# Patient Record
Sex: Female | Born: 1965
Health system: Southern US, Community
[De-identification: ages and names within clinical notes are randomized; demographics above are authoritative.]

## PROBLEM LIST (undated history)

## (undated) DIAGNOSIS — N951 Menopausal and female climacteric states: Secondary | ICD-10-CM

## (undated) DIAGNOSIS — N736 Female pelvic peritoneal adhesions (postinfective): Secondary | ICD-10-CM

## (undated) DIAGNOSIS — C439 Malignant melanoma of skin, unspecified: Secondary | ICD-10-CM

## (undated) DIAGNOSIS — N3090 Cystitis, unspecified without hematuria: Secondary | ICD-10-CM

## (undated) DIAGNOSIS — T753XXA Motion sickness, initial encounter: Secondary | ICD-10-CM

## (undated) DIAGNOSIS — I10 Essential (primary) hypertension: Secondary | ICD-10-CM

## (undated) DIAGNOSIS — R1319 Other dysphagia: Secondary | ICD-10-CM

## (undated) DIAGNOSIS — K219 Gastro-esophageal reflux disease without esophagitis: Secondary | ICD-10-CM

## (undated) HISTORY — PX: LIPOSUCTION: SHX10

## (undated) HISTORY — PX: EXPLORATORY LAPAROTOMY: SUR591

## (undated) HISTORY — DX: Essential (primary) hypertension: I10

## (undated) HISTORY — DX: Menopausal and female climacteric states: N95.1

## (undated) HISTORY — DX: Malignant melanoma of skin, unspecified: C43.9

## (undated) HISTORY — DX: Female pelvic peritoneal adhesions (postinfective): N73.6

## (undated) HISTORY — DX: Other dysphagia: R13.19

## (undated) HISTORY — PX: ROTATOR CUFF REPAIR: SHX139

## (undated) HISTORY — DX: Cystitis, unspecified without hematuria: N30.90

---

## 1995-10-04 HISTORY — PX: OVARY SURGERY: SHX727

## 1998-10-03 HISTORY — PX: ABDOMINAL HYSTERECTOMY: SHX81

## 2004-12-17 ENCOUNTER — Ambulatory Visit: Payer: Self-pay | Admitting: Internal Medicine

## 2005-05-11 ENCOUNTER — Ambulatory Visit: Payer: Self-pay | Admitting: Podiatry

## 2008-02-08 ENCOUNTER — Ambulatory Visit: Payer: Self-pay | Admitting: Internal Medicine

## 2008-02-11 ENCOUNTER — Ambulatory Visit: Payer: Self-pay | Admitting: Internal Medicine

## 2009-01-15 ENCOUNTER — Ambulatory Visit: Payer: Self-pay | Admitting: Unknown Physician Specialty

## 2009-01-22 ENCOUNTER — Ambulatory Visit: Payer: Self-pay | Admitting: Unknown Physician Specialty

## 2009-03-25 ENCOUNTER — Ambulatory Visit: Payer: Self-pay | Admitting: Unknown Physician Specialty

## 2009-03-26 ENCOUNTER — Ambulatory Visit: Payer: Self-pay | Admitting: Unknown Physician Specialty

## 2010-01-31 HISTORY — PX: BREAST CYST ASPIRATION: SHX578

## 2010-06-10 ENCOUNTER — Encounter: Payer: Self-pay | Admitting: Obstetrics and Gynecology

## 2010-07-03 ENCOUNTER — Encounter: Payer: Self-pay | Admitting: Obstetrics and Gynecology

## 2010-08-03 ENCOUNTER — Encounter: Payer: Self-pay | Admitting: Obstetrics and Gynecology

## 2011-10-04 HISTORY — PX: BASAL CELL CARCINOMA EXCISION: SHX1214

## 2012-05-30 ENCOUNTER — Ambulatory Visit: Payer: Self-pay | Admitting: Family Medicine

## 2012-06-03 HISTORY — PX: MELANOMA EXCISION: SHX5266

## 2012-06-13 ENCOUNTER — Ambulatory Visit: Payer: Self-pay | Admitting: Anesthesiology

## 2012-06-13 LAB — HEMOGLOBIN: HGB: 13.2 g/dL (ref 12.0–16.0)

## 2012-06-13 LAB — POTASSIUM: Potassium: 3.2 mmol/L — ABNORMAL LOW (ref 3.5–5.1)

## 2012-06-19 ENCOUNTER — Ambulatory Visit: Payer: Self-pay | Admitting: Surgery

## 2012-06-19 HISTORY — PX: CHOLECYSTECTOMY: SHX55

## 2012-06-19 LAB — HEPATIC FUNCTION PANEL A (ARMC)
Albumin: 3.9 g/dL (ref 3.4–5.0)
Alkaline Phosphatase: 66 U/L (ref 50–136)
Bilirubin, Direct: <0.1 mg/dL (ref 0.00–0.20)
Bilirubin,Total: 0.4 mg/dL (ref 0.2–1.0)
SGOT(AST): 10 U/L — ABNORMAL LOW (ref 15–37)
SGPT (ALT): 16 U/L (ref 12–78)
Total Protein: 7.7 g/dL (ref 6.4–8.2)

## 2012-06-19 LAB — BASIC METABOLIC PANEL
Anion Gap: 5 — ABNORMAL LOW (ref 7–16)
BUN: 7 mg/dL (ref 7–18)
Calcium, Total: 9.5 mg/dL (ref 8.5–10.1)
Chloride: 103 mmol/L (ref 98–107)
Co2: 30 mmol/L (ref 21–32)
Creatinine: 0.9 mg/dL (ref 0.60–1.30)
EGFR (African American): >60
EGFR (Non-African Amer.): >60
Glucose: 88 mg/dL (ref 65–99)
Osmolality: 273 (ref 275–301)
Potassium: 3.9 mmol/L (ref 3.5–5.1)
Sodium: 138 mmol/L (ref 136–145)

## 2012-06-19 LAB — CBC WITH DIFFERENTIAL/PLATELET
Basophil #: 0.1 10*3/uL (ref 0.0–0.1)
Basophil %: 1 %
Eosinophil #: 0.1 10*3/uL (ref 0.0–0.7)
Eosinophil %: 1.9 %
HCT: 37.6 % (ref 35.0–47.0)
HGB: 13.1 g/dL (ref 12.0–16.0)
Lymphocyte #: 2.2 10*3/uL (ref 1.0–3.6)
Lymphocyte %: 43.2 %
MCH: 29.5 pg (ref 26.0–34.0)
MCHC: 34.7 g/dL (ref 32.0–36.0)
MCV: 85 fL (ref 80–100)
Monocyte #: 0.5 x10 3/mm (ref 0.2–0.9)
Monocyte %: 9.4 %
Neutrophil #: 2.3 10*3/uL (ref 1.4–6.5)
Neutrophil %: 44.5 %
Platelet: 241 10*3/uL (ref 150–440)
RBC: 4.43 10*6/uL (ref 3.80–5.20)
RDW: 13.4 % (ref 11.5–14.5)
WBC: 5.2 10*3/uL (ref 3.6–11.0)

## 2012-06-21 LAB — PATHOLOGY REPORT

## 2013-05-14 LAB — BASIC METABOLIC PANEL
BUN: 12 mg/dL (ref 4–21)
Creatinine: 0.8 mg/dL (ref 0.5–1.1)
Glucose: 91 mg/dL
Potassium: 4.6 mmol/L (ref 3.4–5.3)
Sodium: 142 mmol/L (ref 137–147)

## 2013-05-14 LAB — LIPID PANEL
Cholesterol: 224 mg/dL — AB (ref 0–200)
HDL: 61 mg/dL (ref 35–70)
LDL Cholesterol: 147 mg/dL
Triglycerides: 78 mg/dL (ref 40–160)

## 2013-05-14 LAB — CBC AND DIFFERENTIAL
HCT: 41 % (ref 36–46)
Hemoglobin: 13.8 g/dL (ref 12.0–16.0)
Neutrophils Absolute: 4 /uL
Platelets: 288 10*3/uL (ref 150–399)
WBC: 6.2 10^3/mL

## 2013-05-14 LAB — HEPATIC FUNCTION PANEL
ALT: 10 U/L (ref 7–35)
AST: 15 U/L (ref 13–35)
Alkaline Phosphatase: 55 U/L (ref 25–125)
Bilirubin, Total: 0.4 mg/dL

## 2013-05-14 LAB — TSH: TSH: 0.67 u[IU]/mL (ref 0.41–5.90)

## 2013-06-12 ENCOUNTER — Ambulatory Visit: Payer: Self-pay | Admitting: Family Medicine

## 2013-06-26 ENCOUNTER — Ambulatory Visit: Payer: Self-pay | Admitting: Family Medicine

## 2013-09-23 ENCOUNTER — Ambulatory Visit: Payer: Self-pay | Admitting: Family Medicine

## 2014-07-25 ENCOUNTER — Ambulatory Visit: Payer: Self-pay | Admitting: Orthopedic Surgery

## 2014-09-30 ENCOUNTER — Ambulatory Visit: Payer: Self-pay | Admitting: Orthopedic Surgery

## 2015-01-20 NOTE — Op Note (Signed)
PATIENT NAME:  Jill Porter, Jill Porter MR#:  [id number] DATE OF BIRTH:  1966/04/13  DATE OF PROCEDURE:  06/19/2012  PREOPERATIVE DIAGNOSIS: Symptomatic cholelithiasis.  POSTOPERATIVE DIAGNOSIS: Symptomatic cholelithiasis.   PROCEDURE PERFORMED: Laparoscopic cholecystectomy.   ATTENDING SURGEON: San Lohmeyer A. Marina Gravel, MD FACS  TYPE OF ANESTHESIA: General endotracheal.   INDICATIONS: The patient is a 49 year old white female with a several week history of persistent right upper quadrant postprandial abdominal pain associated with nausea and indigestion. Work-up is consistent with cholelithiasis. I discussed with her and her family laparoscopic cholecystectomy, the risks of surgery including that of bleeding, infection, bile duct injury, need for conversion to open operation, future need for ERCP, and all of her questions were answered.   FINDINGS: Stones.   SPECIMEN: Gallbladder with contents.   ESTIMATED BLOOD LOSS: 25 mL.   DRAINS: None.  LAP AND NEEDLE COUNT: Correct x2.   DESCRIPTION OF PROCEDURE: With the patient in the supine position, general oral endotracheal anesthesia was induced. Her abdomen was widely prepped and draped with ChloraPrep solution. Left arm was padded and tucked at her side. Time-out was observed.   A 12 mm blunt Hassan trocar was placed through an open technique with stay sutures being passed through the fascia and infraumbilical transverse incision. Pneumoperitoneum was established. A 5 mm Bladeless trocar was placed in the epigastric region. The patient was then positioned in reverse Trendelenburg and airplane right side up and two 5-mm lateral ports were placed in the right subcostal margin. The gallbladder was grasped along the fundus and elevated towards the right shoulder. Filmy adhesions of the stomach and omentum were taken down off the anterior surface of the gallbladder with sharp technique. The hepatoduodenal ligament was then incised with blunt technique and hook  electrocautery along the medial and lateral borders of the gallbladder along the peritoneal reflection liberating a cystic duct and cystic artery. Critical view of safety cholecystectomy was performed with three clips being placed on the cystic duct, one on the gallbladder side, two on the cystic artery portal side, and one on the gallbladder side and both structures were then divided sharply. Small several lymphatics were then cauterized. The gallbladder was then retrieved off the gallbladder fossa utilizing hook cautery apparatus. A small rent was made in the gallbladder during this and small spillage of bile was encountered which was easily aspirated and washed out with a total of 2 liters of normal saline at the completion of the operation. There was one small bleeding point on the gallbladder fossa near its edge requiring application of two 10 mm hemoclips, cautery, Surgiflo with thrombin. Hemostasis was ensured on the operative field. The gallbladder was placed into an EndoCatch device and retrieved. With hemostasis being ensured on the operative field, no evidence of further bleeding or bowel injury, the ports were then removed under direct visualization and the infraumbilical fascial defect was closed with an additional figure-of-eight #0 Vicryl suture in vertical orientation. The existing stay sutures were tied to each other. A total of 30 mL of 0.25% plain Marcaine was infiltrated along all skin and fascial incisions prior to closure.   Skin edges were approximated with subcuticular 4-0 Vicryl suture. Benzoin and half-inch Steri-Strips were then applied followed by Tegaderm and Telfa. The patient was then subsequently extubated and taken to the recovery room in stable and satisfactory condition by anesthesia services.   ____________________________ [name] Marina Gravel, MD FACS mab:drc D: 06/19/2012 10:55:24 ET T: 06/19/2012 11:39:03 ET JOB#: [id number]  cc: Elta Guadeloupe A. Marina Gravel, MD, <Dictator>  [name]  MD ELECTRONICALLY SIGNED 06/21/2012 18:17

## 2015-01-28 NOTE — Op Note (Signed)
PATIENT NAME:  Jill Porter, HASSING MR#:  [id number] DATE OF BIRTH:  1966/01/11  DATE OF PROCEDURE:  09/30/2014  PREOPERATIVE DIAGNOSIS: Left shoulder high-grade partial-thickness tear of the supraspinatus with subacromial impingement.   POSTOPERATIVE DIAGNOSIS: Left shoulder high-grade partial-thickness tear of the supraspinatus with subacromial impingement with superior labral fraying.   PROCEDURE: Left shoulder arthroscopic labral debridement and subacromial decompression with mini open rotator cuff repair.   ANESTHESIA: General with interscalene block.   SPECIMENS: None.   COMPLICATIONS: None.   SURGEON: Thornton Park, MD.   ESTIMATED BLOOD LOSS: Minimal.   COMPLICATIONS: None.   INDICATION FOR THE PROCEDURE: The patient is a 49 year old female with persistent left shoulder pain which has not responded to nonoperative management including meloxicam, corticosteroid injection, and physical therapy. An MRI has revealed a high-grade partial tear of the supraspinatus. The patient decided to proceed with a mini open rotator cuff repair given the longevity of her symptoms as well as the persistent pain and disability it has caused. I reviewed the risks and benefits of surgery. She understands the risks include but are not limited to infection, bleeding, nerve or blood vessel injury, shoulder stiffness, persistent pain or instability, retear of the rotator cuff, and the need for further surgery. Medical risks include but are not limited to DVT and pulmonary, myocardial infarction, stroke, pneumonia, respiratory failure, and death. The patient understood these risks and wished to proceed.   PROCEDURE NOTE: The patient was met in the preoperative area. Her husband was at the bedside. I marked the left shoulder with the word "yes" according to the hospital's correct site surgery protocol. She had an interscalene block placed by the anesthesia service at the [facility]. The patient was then  brought to the operating room. She underwent general anesthesia and was positioned in the beach chair position. All bony prominences were adequately padded. A spider arm positioner was utilized for this case.   The patient was prepped and draped in a sterile fashion. A timeout was performed to verify the patient's name, date of birth, medical record number, correct site of surgery, and correct procedure to be performed. It was also used to verify the patient had received antibiotics and that all appropriate instruments, implants, and radiographic studies were available in the room. Once all in attendance were in agreement the case began.   Examination under anesthesia revealed full passive shoulder range of motion. There was no instability to load and shift testing in an anterior or posterior direction. She had a negative sulcus sign.   The bony landmarks were drawn out with a surgical marker along with the proposed incisions. These were pre-injected with 1% lidocaine plain. An 11 blade was used to establish a posterior portal through which the arthroscope was placed into the glenohumeral joint. A full diagnostic examination of the shoulder was undertaken.   Findings on arthroscopy included fraying of the superior labrum with a large superior sublabral sulcus. The biceps anchor and the superior labrum appeared to be intact and there did not seem to be any instability of the biceps. The subscapularis was intact as was the middle glenohumeral ligament. There was fraying of the supraspinatus with a high-grade partial thickness tear. The infraspinatus was intact. The anterior and posterior labrum was intact as well. There were no loose bodies or HAGL lesion seen in the inferior recess. There were no chondral lesions to the glenoid or humeral head surfaces. The anterior portal was established using an 18-gauge spinal needle for localization. Through  this incision a 5.75 mm arthroscopic cannula was placed. This  allowed for a hook probe to be placed for palpation of the superior labrum in the rotator cuff. A 4-0 resector shaver blade was used to debride the fraying of the superior labrum and supraspinatus. The biceps tendon had no intrasubstance tear.   Once the superior labrum was debrided it was felt to be stable and did not require any suture anchors. A 0 PDS was placed through the supraspinatus so it could be identified on the bursal side. The arthroscope was then placed into the subacromial space. Abundant bursitis was encountered. A lateral portal was then established using an 18-gauge spinal needle for localization. Through the lateral portal a 90 degree ArthroCare wand and 4-0 resector shaver blade were used to perform a bursectomy. The 0 PDS was then identified. This allowed for localization of the tear from the bursal side. The tear was completed using a 4-0 resector shaver blade after the 0 PDS was removed. Two Energy manager were placed in the lateral edge of the rotator cuff tear. A 5.5 mm resector shaver blade was then placed through the lateral portal and a subacromial decompression was performed. The patient's MRI did not show advanced degenerative changes at the [facility] joint and therefore distal clavicle excision was not performed.   All bony debris and bursitis were removed from the subacromial space and then all arthroscopic instruments were removed.   A 15 blade was used to make a saber-type incision along the lateral acromion. The deltoid fascia was then identified. The deltoid muscle was split in line with its fibers. The Smart Stitches which had been placed arthroscopically were brought out through the deltoid split. A 5.5 mm resector shaver blade was used to gently burr the footprint of the greater tuberosity to remove any remaining torn fibers of the supraspinatus. Punctate bleeding was identified which will assist in healing of the rotator cuff repair. Two ArthroCare Magnum 2 anchors  were then placed for lateral row fixation in the greater tuberosity. A single Smart Stitch suture which had been placed in the lateral edge of the rotator cuff was threaded through each of these 2 anchors respectively. The sutures were then tensioned for anatomic placement of the rotator cuff repair. The Magnum 2 anchor suture tails were then cut. External images of the rotator cuff repair were taken with the arthroscope. The arthroscope was then placed back into the glenohumeral joint and arthroscopic images of the rotator cuff repair were also taken. The wounds were copiously irrigated. The deltoid split was repaired using a 0 Vicryl and the subcutaneous tissue was closed with 2-0 Vicryl. The skin of the saber incision was closed using a running 4-0 undyed Monocryl. The 3 portal incisions were closed with 4-0 nylon with a single 2-0 Vicryl in the subcutaneous tissues of each of the portal incisions. A dry sterile dressing was applied over Steri-Strips and Xeroform. TENS unit pads as well as a Polar Care sleeve were applied to the left shoulder. The patient was placed in an abduction sling. She was then awakened and brought to the PACU in stable condition. I was scrubbed and present for the entire case and all sharp and instrument counts were correct at the conclusion of the case.    ____________________________ Timoteo Gaul, MD klk:bu D: 10/02/2014 18:28:07 ET T: 10/02/2014 18:46:39 ET JOB#: [id number]  cc: Timoteo Gaul, MD, <Dictator> Timoteo Gaul MD ELECTRONICALLY SIGNED 10/05/2014 16:52

## 2015-03-31 DIAGNOSIS — Z8742 Personal history of other diseases of the female genital tract: Secondary | ICD-10-CM

## 2015-03-31 DIAGNOSIS — F419 Anxiety disorder, unspecified: Secondary | ICD-10-CM

## 2015-03-31 DIAGNOSIS — E559 Vitamin D deficiency, unspecified: Secondary | ICD-10-CM

## 2015-03-31 DIAGNOSIS — E785 Hyperlipidemia, unspecified: Secondary | ICD-10-CM

## 2015-03-31 DIAGNOSIS — N390 Urinary tract infection, site not specified: Secondary | ICD-10-CM

## 2015-03-31 DIAGNOSIS — M2550 Pain in unspecified joint: Secondary | ICD-10-CM | POA: Insufficient documentation

## 2015-03-31 DIAGNOSIS — M722 Plantar fascial fibromatosis: Secondary | ICD-10-CM

## 2015-03-31 DIAGNOSIS — F329 Major depressive disorder, single episode, unspecified: Secondary | ICD-10-CM | POA: Insufficient documentation

## 2015-03-31 DIAGNOSIS — G4700 Insomnia, unspecified: Secondary | ICD-10-CM

## 2015-03-31 DIAGNOSIS — K8020 Calculus of gallbladder without cholecystitis without obstruction: Secondary | ICD-10-CM | POA: Insufficient documentation

## 2015-03-31 DIAGNOSIS — L300 Nummular dermatitis: Secondary | ICD-10-CM

## 2015-03-31 DIAGNOSIS — C439 Malignant melanoma of skin, unspecified: Secondary | ICD-10-CM

## 2015-03-31 DIAGNOSIS — F32A Depression, unspecified: Secondary | ICD-10-CM

## 2015-03-31 DIAGNOSIS — B360 Pityriasis versicolor: Secondary | ICD-10-CM

## 2015-03-31 DIAGNOSIS — I10 Essential (primary) hypertension: Secondary | ICD-10-CM | POA: Insufficient documentation

## 2015-05-04 ENCOUNTER — Encounter: Payer: Self-pay | Admitting: Family Medicine

## 2015-05-04 ENCOUNTER — Ambulatory Visit (INDEPENDENT_AMBULATORY_CARE_PROVIDER_SITE_OTHER): Payer: BLUE CROSS/BLUE SHIELD | Admitting: Family Medicine

## 2015-05-04 ENCOUNTER — Ambulatory Visit: Payer: Self-pay | Admitting: Family Medicine

## 2015-05-04 VITALS — BP 142/98 | HR 110 | Temp 97.8°F | Resp 16 | Ht 64.0 in | Wt 126.0 lb

## 2015-05-04 DIAGNOSIS — F411 Generalized anxiety disorder: Secondary | ICD-10-CM | POA: Diagnosis not present

## 2015-05-04 DIAGNOSIS — G4700 Insomnia, unspecified: Secondary | ICD-10-CM

## 2015-05-04 DIAGNOSIS — I10 Essential (primary) hypertension: Secondary | ICD-10-CM | POA: Diagnosis not present

## 2015-05-04 MED ORDER — LOSARTAN POTASSIUM 50 MG PO TABS: 50.0000 mg | ORAL_TABLET | Freq: Every day | ORAL | Status: DC

## 2015-05-04 NOTE — Progress Notes (Signed)
Patient ID: Jill Porter, female   DOB: [birth year], 49 y.o.   MRN: [id number]   Jill Porter  MRN: [id number] DOB: 1965-12-25  Subjective:  HPI   1. Essential hypertension Patient is a 49 year old female who presents today for follow up of her hypertension.  She was last seen on 01/28/15.  Her blood pressure at that visit was 118/80.  At that time we discontinued her Amlodipine and increased her HCTZ.  She reports good compliance and tolerance of the medication changes.   Patient has checked her blood pressure outside of the office and states that it had been averaging about 130/78.  She does state that this was prior to receiving report on her husband having cancer.  She does report having headaches for about 1 week now.  She states they are in the back of her head and have been bad enough to wake her at night.  2. Generalized anxiety disorder Patient is also here for follow up on her anxiety.  Patient is under increased stress.  Her husband has been diagnosed with colon cancer, he has already had surgery, 12 inches of his bowel was removed and he was found to have 2 positive lymph nodes.  He sees oncology for the first visit tomorrow.  She is also worried about her 89 year old son.  He has been incarcerated for the last 14 months for B&E and he is being released next week.  He is supposed to live with the patient when he gets out and she worries about him getting in trouble again.  3. Insomnia Patient is currently using Ambien to sleep.   She reports with the medication she is sleeping fair.  She falls asleep ok but has trouble staying asleep.  Once she wakes up she has a little trouble going back to sleep.  She said that she is waking about 3-4 times per night every night.   Patient Active Problem List   Diagnosis Date Noted  . Cholelithiasis 03/31/2015  . Melanoma 03/31/2015  . Hx of abnormal cervical Pap smear 03/31/2015  . Tinea versicolor 03/31/2015  . Vitamin D deficiency  03/31/2015  . Hyperlipemia 03/31/2015  . Depression 03/31/2015  . Acute anxiety 03/31/2015  . Insomnia 03/31/2015  . Hypertension 03/31/2015  . Plantar fasciitis 03/31/2015  . Frequent UTI 03/31/2015  . Discoid eczema 03/31/2015    No past medical history on file.  History   Social History  . Marital Status: Married    Spouse Name: N/A  . Number of Children: N/A  . Years of Education: N/A   Occupational History  . Not on file.   Social History Main Topics  . Smoking status: Never Smoker   . Smokeless tobacco: Not on file  . Alcohol Use: 0.0 oz/week    0 Standard drinks or equivalent per week     Comment: Occasional alcohol use; twice a year 2-3 drinks  . Drug Use: No  . Sexual Activity: Not on file   Other Topics Concern  . Not on file   Social History Narrative    Outpatient Prescriptions Prior to Visit  Medication Sig Dispense Refill  . hydrochlorothiazide (HYDRODIURIL) 25 MG tablet Take 25 mg by mouth daily.    Marland Kitchen zolpidem (AMBIEN) 10 MG tablet Take 10 mg by mouth at bedtime as needed for sleep.     No facility-administered medications prior to visit.    Allergies  Allergen Reactions  . Sulfa Antibiotics Hives  .  Penicillins Itching and Rash    Review of Systems  Constitutional: Negative for fever, chills, weight loss, malaise/fatigue and diaphoresis.  Respiratory: Negative for cough, hemoptysis, sputum production, shortness of breath and wheezing.   Cardiovascular: Negative for chest pain, palpitations, orthopnea, claudication, leg swelling and PND.  Neurological: Positive for headaches. Negative for dizziness and weakness.  Psychiatric/Behavioral: Negative for depression, suicidal ideas, hallucinations, memory loss and substance abuse. The patient is nervous/anxious and has insomnia.    Objective:  BP 142/98 mmHg  Pulse 110  Temp(Src) 97.8 F (36.6 C) (Oral)  Resp 16  Ht 5\' 4"  (1.626 m)  Wt 126 lb (57.153 kg)  BMI 21.62 kg/m2  Physical Exam   Constitutional: She is well-developed, well-nourished, and in no distress.  HENT:  Head: Normocephalic and atraumatic.  Right Ear: External ear normal.  Left Ear: External ear normal.  Neck: Normal range of motion. Neck supple.  Cardiovascular: Normal rate, regular rhythm and normal heart sounds.   Pulmonary/Chest: Effort normal and breath sounds normal.  Skin: Skin is warm and dry.  Psychiatric: Mood, memory, affect and judgment normal.    Assessment and Plan :   1. Essential hypertension  - losartan (COZAAR) 50 MG tablet; Take 1 tablet (50 mg total) by mouth daily.  Dispense: 30 tablet; Refill: 12  Follow up in 1 month  2. Generalized anxiety disorder Have discussed with the patient the risks and benefits of taking something for her nerves.  She is coping at this time and feels she does not need to take anything presently.  We have instructed her to just call if she feels she is going to need something.  Patient has also been advised that if we start her on anything we will see her a week later. More than 50% of encounter spent in discussion and counseling regarding issues.  3. Insomnia Patient is on Ambien and at this time we will not make any changes.     Miguel Aschoff MD [facility] 05/04/2015 11:41 AM

## 2015-06-09 ENCOUNTER — Other Ambulatory Visit: Payer: Self-pay | Admitting: Otolaryngology

## 2015-06-09 DIAGNOSIS — J351 Hypertrophy of tonsils: Secondary | ICD-10-CM

## 2015-06-09 DIAGNOSIS — R0989 Other specified symptoms and signs involving the circulatory and respiratory systems: Secondary | ICD-10-CM

## 2015-06-09 DIAGNOSIS — R198 Other specified symptoms and signs involving the digestive system and abdomen: Secondary | ICD-10-CM

## 2015-06-09 DIAGNOSIS — H9201 Otalgia, right ear: Secondary | ICD-10-CM

## 2015-06-10 ENCOUNTER — Ambulatory Visit (INDEPENDENT_AMBULATORY_CARE_PROVIDER_SITE_OTHER): Payer: BLUE CROSS/BLUE SHIELD | Admitting: Obstetrics and Gynecology

## 2015-06-10 ENCOUNTER — Ambulatory Visit (INDEPENDENT_AMBULATORY_CARE_PROVIDER_SITE_OTHER): Payer: BLUE CROSS/BLUE SHIELD | Admitting: Family Medicine

## 2015-06-10 ENCOUNTER — Encounter: Payer: Self-pay | Admitting: Family Medicine

## 2015-06-10 ENCOUNTER — Encounter: Payer: Self-pay | Admitting: Obstetrics and Gynecology

## 2015-06-10 VITALS — BP 120/78 | HR 105 | Ht 64.0 in | Wt 122.8 lb

## 2015-06-10 VITALS — BP 124/80 | HR 84 | Temp 98.0°F | Resp 16 | Wt 123.0 lb

## 2015-06-10 DIAGNOSIS — N951 Menopausal and female climacteric states: Secondary | ICD-10-CM | POA: Diagnosis not present

## 2015-06-10 DIAGNOSIS — I10 Essential (primary) hypertension: Secondary | ICD-10-CM

## 2015-06-10 DIAGNOSIS — Z1239 Encounter for other screening for malignant neoplasm of breast: Secondary | ICD-10-CM

## 2015-06-10 DIAGNOSIS — Z9889 Other specified postprocedural states: Secondary | ICD-10-CM | POA: Diagnosis not present

## 2015-06-10 DIAGNOSIS — Z1211 Encounter for screening for malignant neoplasm of colon: Secondary | ICD-10-CM

## 2015-06-10 DIAGNOSIS — Z87410 Personal history of cervical dysplasia: Secondary | ICD-10-CM

## 2015-06-10 DIAGNOSIS — Z01419 Encounter for gynecological examination (general) (routine) without abnormal findings: Secondary | ICD-10-CM | POA: Diagnosis not present

## 2015-06-10 DIAGNOSIS — Z90710 Acquired absence of both cervix and uterus: Secondary | ICD-10-CM | POA: Insufficient documentation

## 2015-06-10 DIAGNOSIS — Z90721 Acquired absence of ovaries, unilateral: Secondary | ICD-10-CM

## 2015-06-10 DIAGNOSIS — Z23 Encounter for immunization: Secondary | ICD-10-CM | POA: Diagnosis not present

## 2015-06-10 NOTE — Progress Notes (Signed)
Patient ID: Jill Porter, female   DOB: [birth year], 49 y.o.   MRN: [id number]    Subjective:  HPI  Hypertension, follow-up:  BP Readings from Last 3 Encounters:  06/10/15 124/80  06/10/15 120/78  05/04/15 142/98    She was last seen for hypertension 1 months ago.  BP at that visit was 142/98. Management since that visit includes Started Losartan. She reports good compliance with treatment. She is not having side effects.  She is exercising. She is adherent to low salt diet.   Outside blood pressures are 120's/80's. She is experiencing none.  Patient denies none.    Wt Readings from Last 3 Encounters:  06/10/15 123 lb (55.792 kg)  06/10/15 122 lb 12.8 oz (55.702 kg)  05/04/15 126 lb (57.153 kg)    ------------------------------------------------------------------------     Prior to Admission medications   Medication Sig Start Date End Date Taking? Authorizing Provider  hydrochlorothiazide (HYDRODIURIL) 25 MG tablet Take 25 mg by mouth daily.   Yes Historical Provider, MD  losartan (COZAAR) 50 MG tablet Take 1 tablet (50 mg total) by mouth daily. 05/04/15  Yes Chaselynn Kepple Maceo Pro., MD  zolpidem (AMBIEN) 10 MG tablet Take 10 mg by mouth at bedtime as needed for sleep.   Yes Historical Provider, MD    Patient Active Problem List   Diagnosis Date Noted  . Perimenopausal vasomotor symptoms 06/10/2015  . Status post hysterectomy 06/10/2015  . History of cervical dysplasia 06/10/2015  . History of oophorectomy, unilateral 06/10/2015  . Cholelithiasis 03/31/2015  . Melanoma 03/31/2015  . Hx of abnormal cervical Pap smear 03/31/2015  . Tinea versicolor 03/31/2015  . Vitamin D deficiency 03/31/2015  . Hyperlipemia 03/31/2015  . Depression 03/31/2015  . Acute anxiety 03/31/2015  . Insomnia 03/31/2015  . Hypertension 03/31/2015  . Plantar fasciitis 03/31/2015  . Frequent UTI 03/31/2015  . Discoid eczema 03/31/2015    Past Medical History  Diagnosis Date  .  Recurrent cystitis   . Pelvic adhesions   . Melanoma   . Cervical dysphagia   . Climacteric   . Hypertension     Social History   Social History  . Marital Status: Married    Spouse Name: N/A  . Number of Children: N/A  . Years of Education: N/A   Occupational History  . Not on file.   Social History Main Topics  . Smoking status: Never Smoker   . Smokeless tobacco: Not on file  . Alcohol Use: 0.0 oz/week    0 Standard drinks or equivalent per week     Comment: Occasional alcohol use; twice a year 2-3 drinks  . Drug Use: No  . Sexual Activity: Yes    Birth Control/ Protection: Surgical   Other Topics Concern  . Not on file   Social History Narrative    Allergies  Allergen Reactions  . Sulfa Antibiotics Hives  . Penicillins Itching and Rash    Review of Systems  Constitutional: Negative.   HENT: Negative.   Eyes: Negative.   Respiratory: Negative.   Cardiovascular: Negative.   Gastrointestinal: Negative.   Genitourinary: Negative.   Musculoskeletal: Negative.   Skin: Negative.   Neurological: Negative.   Endo/Heme/Allergies: Negative.   Psychiatric/Behavioral: Negative.      There is no immunization history on file for this patient. Objective:  BP 124/80 mmHg  Pulse 84  Temp(Src) 98 F (36.7 C) (Oral)  Resp 16  Wt 123 lb (55.792 kg)  Physical Exam  Constitutional: She is oriented  to person, place, and time and well-developed, well-nourished, and in no distress.  HENT:  Head: Normocephalic and atraumatic.  Right Ear: External ear normal.  Left Ear: External ear normal.  Nose: Nose normal.  Eyes: Conjunctivae are normal.  Neck: Neck supple.  Cardiovascular: Normal rate, regular rhythm and normal heart sounds.   Pulmonary/Chest: Effort normal and breath sounds normal.  Abdominal: Soft. Bowel sounds are normal.  Neurological: She is alert and oriented to person, place, and time.  Skin: Skin is warm and dry.  Psychiatric: Mood, memory, affect  and judgment normal.    Lab Results  Component Value Date   WBC 6.2 05/14/2013   HGB 13.8 05/14/2013   HCT 41 05/14/2013   PLT 288 05/14/2013   GLUCOSE 88 06/19/2012   CHOL 224* 05/14/2013   TRIG 78 05/14/2013   HDL 61 05/14/2013   LDLCALC 147 05/14/2013   TSH 0.67 05/14/2013    CMP     Component Value Date/Time   NA 142 05/14/2013   NA 138 06/19/2012 0747   K 4.6 05/14/2013   K 3.9 06/19/2012 0747   CL 103 06/19/2012 0747   CO2 30 06/19/2012 0747   GLUCOSE 88 06/19/2012 0747   BUN 12 05/14/2013   BUN 7 06/19/2012 0747   CREATININE 0.8 05/14/2013   CREATININE 0.90 06/19/2012 0747   CALCIUM 9.5 06/19/2012 0747   PROT 7.7 06/19/2012 0747   ALBUMIN 3.9 06/19/2012 0747   AST 15 05/14/2013   AST 10* 06/19/2012 0747   ALT 10 05/14/2013   ALT 16 06/19/2012 0747   ALKPHOS 55 05/14/2013   ALKPHOS 66 06/19/2012 0747   BILITOT 0.4 06/19/2012 0747   GFRNONAA >60 06/19/2012 0747   GFRAA >60 06/19/2012 0747    Assessment and Plan :  Essential hypertension  Need for influenza vaccination - Plan: Flu Vaccine QUAD 36+ mos IM  blood pressure much improved on losartan well tolerated. Renal panel on next visit. Chronic insomnia//depression/anxiety Patient still using medications but doing overall pretty well since her husband has metastatic colon cancer and her son is now living at home but is no longer in jail. I'll see her back in the first of year to see how she is coping.  Miguel Aschoff MD [facility] 06/10/2015 11:56 AM

## 2015-06-10 NOTE — Progress Notes (Signed)
Patient ID: Jill Porter, female   DOB: 05-03-1966, 49 y.o.   MRN: [id number] ANNUAL PREVENTATIVE CARE GYN  ENCOUNTER NOTE  Subjective:       Jill Porter is a 49 y.o. G60P4 female here for a routine annual gynecologic exam.  Current complaints: 1.  Hot flashes and night sweats without vaginal atrophy symptoms   Gynecologic History No LMP recorded. Patient has had a hysterectomy.TAH in 2000 for severe dysplasia Contraception: status post hysterectomyTAH Last Pap: pap w rflx- neg. Results were: normal Last mammogram: 06/06/2014 birad 2. Results were: normal History of severe dysplasia with last abnormal Pap smear approximately 2010; we'll recommend repeat Pap smear testing until 2030 (20 years post-last abnormal Pap smear)  Obstetric History OB History  Gravida Para Term Preterm AB SAB TAB Ectopic Multiple Living  4 4            # Outcome Date GA Lbr Len/2nd Weight Sex Delivery Anes PTL Lv  4 Para           3 Para           2 Para           1 Para               Past Medical History  Diagnosis Date  . Recurrent cystitis   . Pelvic adhesions   . Melanoma   . Cervical dysphagia   . Climacteric   . Hypertension     Past Surgical History  Procedure Laterality Date  . Cholecystectomy  06/19/2012  . Melanoma excision  06/2012    Removed from Rt shoulder  . Basal cell carcinoma excision  2013    removed from forehead and Lt shooulder  . Breast cyst aspiration  01/2010  . Ovary surgery  1997    hx of removal of ovary and adhesions  . Abdominal hysterectomy  2000    due to pre cancerous cells  . Liposuction    . Exploratory laparotomy      lysis of adhesions  . Rotator cuff repair      Current Outpatient Prescriptions on File Prior to Visit  Medication Sig Dispense Refill  . hydrochlorothiazide (HYDRODIURIL) 25 MG tablet Take 25 mg by mouth daily.    Marland Kitchen losartan (COZAAR) 50 MG tablet Take 1 tablet (50 mg total) by mouth daily. 30 tablet 12  . zolpidem (AMBIEN) 10 MG  tablet Take 10 mg by mouth at bedtime as needed for sleep.     No current facility-administered medications on file prior to visit.    Allergies  Allergen Reactions  . Sulfa Antibiotics Hives  . Penicillins Itching and Rash    Social History   Social History  . Marital Status: Married    Spouse Name: N/A  . Number of Children: N/A  . Years of Education: N/A   Occupational History  . Not on file.   Social History Main Topics  . Smoking status: Never Smoker   . Smokeless tobacco: Not on file  . Alcohol Use: 0.0 oz/week    0 Standard drinks or equivalent per week     Comment: Occasional alcohol use; twice a year 2-3 drinks  . Drug Use: No  . Sexual Activity: Yes    Birth Control/ Protection: Surgical   Other Topics Concern  . Not on file   Social History Narrative    Family History  Problem Relation Age of Onset  . Hypertension Mother   . Hypertension Father   .  Heart attack Other   . Cancer Neg Hx   . Diabetes Neg Hx   . Heart disease Neg Hx     The following portions of the patient's history were reviewed and updated as appropriate: allergies, current medications, past family history, past medical history, past social history, past surgical history and problem list.  Review of Systems ROS Review of Systems - General ROS: negative for - chills, fatigue, fever, hot flashes, night sweats, weight gain or weight loss Psychological ROS: negative for - anxiety, decreased libido, depression, mood swings, physical abuse or sexual abuse Ophthalmic ROS: negative for - blurry vision, eye pain or loss of vision ENT ROS: negative for - headaches, hearing change, visual changes or vocal changes Allergy and Immunology ROS: negative for - hives, itchy/watery eyes or seasonal allergies Hematological and Lymphatic ROS: negative for - bleeding problems, bruising, swollen lymph nodes or weight loss Endocrine ROS: negative for - galactorrhea, hair pattern changes, hot flashes,  malaise/lethargy, mood swings, palpitations, polydipsia/polyuria, skin changes, temperature intolerance or unexpected weight changes Breast ROS: negative for - new or changing breast lumps or nipple discharge Respiratory ROS: negative for - cough or shortness of breath Cardiovascular ROS: negative for - chest pain, irregular heartbeat, palpitations or shortness of breath Gastrointestinal ROS: no abdominal pain, change in bowel habits, or black or bloody stools Genito-Urinary ROS: no dysuria, trouble voiding, or hematuria Musculoskeletal ROS: negative for - joint pain or joint stiffness Neurological ROS: negative for - bowel and bladder control changes Dermatological ROS: negative for rash and skin lesion changes   Objective:   BP 120/78 mmHg  Pulse 105  Ht 5\' 4"  (1.626 m)  Wt 122 lb 12.8 oz (55.702 kg)  BMI 21.07 kg/m2 CONSTITUTIONAL: Well-developed, well-nourished female in no acute distress.  PSYCHIATRIC: Normal mood and affect. Normal behavior. Normal judgment and thought content. [place]: Alert and oriented to person, place, and time. Normal muscle tone coordination. No cranial nerve deficit noted. HENT:  Normocephalic, atraumatic, External right and left ear normal. Oropharynx is clear and moist EYES: Conjunctivae and EOM are normal. Pupils are equal, round, and reactive to light. No scleral icterus.  NECK: Normal range of motion, supple, no masses.  Normal thyroid.  SKIN: Skin is warm and dry. No rash noted. Not diaphoretic. No erythema. No pallor. CARDIOVASCULAR: Normal heart rate noted, regular rhythm, no murmur. RESPIRATORY: Clear to auscultation bilaterally. Effort and breath sounds normal, no problems with respiration noted. BREASTS: Symmetric in size. No masses, skin changes, nipple drainage, or lymphadenopathy.breasts implants are present with well-healed surgical scars ABDOMEN: Soft, normal bowel sounds, no distention noted.  No tenderness, rebound or guarding. Midline  incision is well-healed along with a well-healed abdominoplasty scar (transverse) BLADDER: Normal PELVIC:  External Genitalia: Normal  BUS: Normal  Vagina: Normal; fair estrogen effect  Cervix: surgically absent  Uterus: surgically absent  Adnexa: Normal; nonpalpable, nontender  RV: External Exam NormaI, No Rectal Masses and Normal Sphincter tone  MUSCULOSKELETAL: Normal range of motion. No tenderness.  No cyanosis, clubbing, or edema.  2+ distal pulses. LYMPHATIC: No Axillary, Supraclavicular, or Inguinal Adenopathy.    Assessment:   Annual gynecologic examination 49 y.o. Contraception: status post hysterectomy Normal BMI Vasomotor symptoms History of breast augmentation and abdominoplasty History of TAH and LSO  Plan:  Pap: not performed Mammogram: Ordered Stool Guaiac Testing:  screening colonoscopy and Ordered Labs: lipid a1c vit d fbs  tsh and FSH Routine preventative health maintenance measures emphasized: Exercise/Diet/Weight control, Tobacco Warnings and Alcohol/Substance use risks  Calcium with vitamin D twice a day encouraged Return to [place], [place]  Brayton Mars, MD

## 2015-06-10 NOTE — Patient Instructions (Signed)
1.  Mammogram scheduled. 2.  Colonoscopy scheduled. 3.  Recommend calcium with vitamin D twice a day. 4.  Screening lab work obtained including [facility] testing to assess for menopause. 5.  Return in one year for annual

## 2015-06-11 ENCOUNTER — Telehealth: Payer: Self-pay

## 2015-06-11 LAB — TSH: TSH: 0.885 u[IU]/mL (ref 0.450–4.500)

## 2015-06-11 LAB — VITAMIN D 25 HYDROXY (VIT D DEFICIENCY, FRACTURES): Vit D, 25-Hydroxy: 30 ng/mL (ref 30.0–100.0)

## 2015-06-11 LAB — GLUCOSE, RANDOM: Glucose: 89 mg/dL (ref 65–99)

## 2015-06-11 LAB — LIPID PANEL
Chol/HDL Ratio: 4.1 ratio units (ref 0.0–4.4)
Cholesterol, Total: 230 mg/dL — ABNORMAL HIGH (ref 100–199)
HDL: 56 mg/dL (ref >39)
LDL Calculated: 152 mg/dL — ABNORMAL HIGH (ref 0–99)
Triglycerides: 109 mg/dL (ref 0–149)
VLDL Cholesterol Cal: 22 mg/dL (ref 5–40)

## 2015-06-11 LAB — HEMOGLOBIN A1C
Est. average glucose Bld gHb Est-mCnc: 120 mg/dL
Hgb A1c MFr Bld: 5.8 % — ABNORMAL HIGH (ref 4.8–5.6)

## 2015-06-11 LAB — FOLLICLE STIMULATING HORMONE: FSH: 72.1 m[IU]/mL

## 2015-06-11 NOTE — Telephone Encounter (Signed)
-----   Message from Brayton Mars, MD sent at 06/11/2015  3:04 PM EDT ----- Please notify - Abnormal Labs Total cholesterol and LDL cholesterol are elevated; recommend healthy eating and exercise and repeat lipid one panel in 1 year. Hemoglobin A1c is 5.8, consistent with prediabetes; again, repeat in one year.  Continue to eat healthy and exercise. FSH is consistent with menopause.

## 2015-06-11 NOTE — Telephone Encounter (Signed)
Pt aware. Pt wants something for nite sweats and hot flashes. Advised I will send message to mad.

## 2015-06-12 MED ORDER — ESTRADIOL 1 MG PO TABS: 1.0000 mg | ORAL_TABLET | Freq: Every day | ORAL | Status: DC

## 2015-06-12 NOTE — Telephone Encounter (Signed)
Pt aware med erx. Appt made for 10/19 at 10:45.

## 2015-06-16 ENCOUNTER — Other Ambulatory Visit: Payer: Self-pay

## 2015-06-16 ENCOUNTER — Telehealth: Payer: Self-pay | Admitting: Gastroenterology

## 2015-06-16 ENCOUNTER — Ambulatory Visit
Admission: RE | Admit: 2015-06-16 | Discharge: 2015-06-16 | Disposition: A | Discharge status: Home / Self Care | Payer: BLUE CROSS/BLUE SHIELD | Source: Ambulatory Visit | Attending: Otolaryngology | Admitting: Otolaryngology

## 2015-06-16 DIAGNOSIS — F458 Other somatoform disorders: Secondary | ICD-10-CM | POA: Insufficient documentation

## 2015-06-16 DIAGNOSIS — J351 Hypertrophy of tonsils: Secondary | ICD-10-CM | POA: Insufficient documentation

## 2015-06-16 DIAGNOSIS — R198 Other specified symptoms and signs involving the digestive system and abdomen: Secondary | ICD-10-CM

## 2015-06-16 DIAGNOSIS — R0989 Other specified symptoms and signs involving the circulatory and respiratory systems: Secondary | ICD-10-CM

## 2015-06-16 DIAGNOSIS — H9201 Otalgia, right ear: Secondary | ICD-10-CM

## 2015-06-16 MED ORDER — IOHEXOL 300 MG/ML  SOLN
75.0000 mL | Freq: Once | INTRAMUSCULAR | Status: AC | PRN
  Administered 2015-06-16: 75 mL via INTRAVENOUS

## 2015-06-16 NOTE — Telephone Encounter (Signed)
Gastroenterology Pre-Procedure Review  Request Date: 07-06-2015 Requesting Physician: Dr.Gilbert  PATIENT REVIEW QUESTIONS: The patient responded to the following health history questions as indicated:    1. Are you having any GI issues? no 2. Do you have a personal history of Polyps? no 3. Do you have a family history of Colon Cancer or Polyps? no 4. Diabetes Mellitus? no 5. Joint replacements in the past 12 months?no 6. Major health problems in the past 3 months?no 7. Any artificial heart valves, MVP, or defibrillator?no    MEDICATIONS & ALLERGIES:    Patient reports the following regarding taking any anticoagulation/antiplatelet therapy:   Plavix, Coumadin, Eliquis, Xarelto, Lovenox, Pradaxa, Brilinta, or Effient? no Aspirin? no  Patient confirms/reports the following medications:  Current Outpatient Prescriptions  Medication Sig Dispense Refill   estradiol (ESTRACE) 1 MG tablet Take 1 tablet (1 mg total) by mouth daily. 30 tablet 6   hydrochlorothiazide (HYDRODIURIL) 25 MG tablet Take 25 mg by mouth daily.     losartan (COZAAR) 50 MG tablet Take 1 tablet (50 mg total) by mouth daily. 30 tablet 12   zolpidem (AMBIEN) 10 MG tablet Take 10 mg by mouth at bedtime as needed for sleep.     No current facility-administered medications for this visit.    Patient confirms/reports the following allergies:  Allergies  Allergen Reactions   Sulfa Antibiotics Hives   Penicillins Itching and Rash    No orders of the defined types were placed in this encounter.    AUTHORIZATION INFORMATION Primary Insurance: 1D#: Group #:  Secondary Insurance: 1D#: Group #:  SCHEDULE INFORMATION: Date: 07-06-2015 Time: Location:[facility]

## 2015-06-23 LAB — HM MAMMOGRAPHY

## 2015-07-04 NOTE — Discharge Instructions (Signed)

## 2015-07-06 ENCOUNTER — Ambulatory Visit: Payer: BLUE CROSS/BLUE SHIELD | Admitting: Anesthesiology

## 2015-07-06 ENCOUNTER — Ambulatory Visit
Admission: RE | Admit: 2015-07-06 | Discharge: 2015-07-06 | Disposition: A | Discharge status: Home / Self Care | Payer: BLUE CROSS/BLUE SHIELD | Source: Ambulatory Visit | Attending: Gastroenterology | Admitting: Gastroenterology

## 2015-07-06 ENCOUNTER — Encounter
Admission: RE | Disposition: A | Discharge status: Home / Self Care | Payer: Self-pay | Source: Ambulatory Visit | Attending: Gastroenterology

## 2015-07-06 DIAGNOSIS — Z79899 Other long term (current) drug therapy: Secondary | ICD-10-CM | POA: Insufficient documentation

## 2015-07-06 DIAGNOSIS — Z8249 Family history of ischemic heart disease and other diseases of the circulatory system: Secondary | ICD-10-CM | POA: Diagnosis not present

## 2015-07-06 DIAGNOSIS — Z882 Allergy status to sulfonamides status: Secondary | ICD-10-CM | POA: Insufficient documentation

## 2015-07-06 DIAGNOSIS — I10 Essential (primary) hypertension: Secondary | ICD-10-CM | POA: Diagnosis not present

## 2015-07-06 DIAGNOSIS — Z780 Asymptomatic menopausal state: Secondary | ICD-10-CM | POA: Diagnosis not present

## 2015-07-06 DIAGNOSIS — Z833 Family history of diabetes mellitus: Secondary | ICD-10-CM | POA: Insufficient documentation

## 2015-07-06 DIAGNOSIS — Z85820 Personal history of malignant melanoma of skin: Secondary | ICD-10-CM | POA: Diagnosis not present

## 2015-07-06 DIAGNOSIS — K641 Second degree hemorrhoids: Secondary | ICD-10-CM | POA: Diagnosis not present

## 2015-07-06 DIAGNOSIS — Z9049 Acquired absence of other specified parts of digestive tract: Secondary | ICD-10-CM | POA: Diagnosis not present

## 2015-07-06 DIAGNOSIS — Z809 Family history of malignant neoplasm, unspecified: Secondary | ICD-10-CM | POA: Insufficient documentation

## 2015-07-06 DIAGNOSIS — Z1211 Encounter for screening for malignant neoplasm of colon: Secondary | ICD-10-CM | POA: Diagnosis present

## 2015-07-06 DIAGNOSIS — Z880 Allergy status to penicillin: Secondary | ICD-10-CM | POA: Insufficient documentation

## 2015-07-06 DIAGNOSIS — Z9889 Other specified postprocedural states: Secondary | ICD-10-CM | POA: Insufficient documentation

## 2015-07-06 DIAGNOSIS — Z7689 Persons encountering health services in other specified circumstances: Secondary | ICD-10-CM | POA: Insufficient documentation

## 2015-07-06 HISTORY — PX: COLONOSCOPY WITH PROPOFOL: SHX5780

## 2015-07-06 HISTORY — DX: Motion sickness, initial encounter: T75.3XXA

## 2015-07-06 SURGERY — COLONOSCOPY WITH PROPOFOL
Anesthesia: Monitor Anesthesia Care | Wound class: Contaminated

## 2015-07-06 MED ORDER — SIMETHICONE 40 MG/0.6ML PO SUSP
ORAL | Status: DC | PRN
  Administered 2015-07-06: 08:00:00

## 2015-07-06 MED ORDER — FENTANYL CITRATE (PF) 100 MCG/2ML IJ SOLN: 25.0000 ug | INTRAMUSCULAR | Status: DC | PRN

## 2015-07-06 MED ORDER — LIDOCAINE HCL (CARDIAC) 20 MG/ML IV SOLN
INTRAVENOUS | Status: DC | PRN
  Administered 2015-07-06: 30 mg via INTRAVENOUS

## 2015-07-06 MED ORDER — PROPOFOL 10 MG/ML IV BOLUS
INTRAVENOUS | Status: DC | PRN
  Administered 2015-07-06 (×2): 40 mg via INTRAVENOUS
  Administered 2015-07-06: 60 mg via INTRAVENOUS
  Administered 2015-07-06: 80 mg via INTRAVENOUS
  Administered 2015-07-06: 40 mg via INTRAVENOUS
  Administered 2015-07-06: 20 mg via INTRAVENOUS
  Administered 2015-07-06: 40 mg via INTRAVENOUS

## 2015-07-06 MED ORDER — LACTATED RINGERS IV SOLN
INTRAVENOUS | Status: DC
  Administered 2015-07-06 (×2): via INTRAVENOUS

## 2015-07-06 SURGICAL SUPPLY — 28 items

## 2015-07-06 NOTE — Anesthesia Preprocedure Evaluation (Signed)
Anesthesia Evaluation  Patient identified by MRN, date of birth, ID band Patient awake    Reviewed: Allergy & Precautions, NPO status , Patient's Chart, lab work & pertinent test results  Airway Mallampati: II  TM Distance: >3 FB Neck ROM: Full    Dental no notable dental hx.    Pulmonary neg pulmonary ROS,    Pulmonary exam normal breath sounds clear to auscultation       Cardiovascular hypertension, negative cardio ROS Normal cardiovascular exam Rhythm:Regular Rate:Normal     Neuro/Psych negative neurological ROS  negative psych ROS   GI/Hepatic negative GI ROS, Neg liver ROS,   Endo/Other  negative endocrine ROS  Renal/GU negative Renal ROS  negative genitourinary   Musculoskeletal negative musculoskeletal ROS (+)   Abdominal   Peds negative pediatric ROS (+)  Hematology negative hematology ROS (+)   Anesthesia Other Findings   Reproductive/Obstetrics negative OB ROS                             Anesthesia Physical Anesthesia Plan  ASA: II  Anesthesia Plan: MAC   Post-op Pain Management:    Induction: Intravenous  Airway Management Planned:   Additional Equipment:   Intra-op Plan:   Post-operative Plan: Extubation in OR  Informed Consent: I have reviewed the patients History and Physical, chart, labs and discussed the procedure including the risks, benefits and alternatives for the proposed anesthesia with the patient or authorized representative who has indicated his/her understanding and acceptance.   Dental advisory given  Plan Discussed with: CRNA  Anesthesia Plan Comments:         Anesthesia Quick Evaluation

## 2015-07-06 NOTE — Anesthesia Postprocedure Evaluation (Signed)
  Anesthesia Post-op Note  Patient: Jill Porter  Procedure(s) Performed: Procedure(s): COLONOSCOPY WITH PROPOFOL (N/A)  Anesthesia type:MAC  Patient location: PACU  Post pain: Pain level controlled  Post assessment: Post-op Vital signs reviewed, Patient's Cardiovascular Status Stable, Respiratory Function Stable, Patent Airway and No signs of Nausea or vomiting  Post vital signs: Reviewed and stable  Last Vitals:  Filed Vitals:   07/06/15 0830  BP: 109/90  Pulse: 104  Temp:   Resp: 25    Level of consciousness: awake, alert  and patient cooperative  Complications: No apparent anesthesia complications

## 2015-07-06 NOTE — Transfer of Care (Signed)
Immediate Anesthesia Transfer of Care Note  Patient: Jill Porter  Procedure(s) Performed: Procedure(s): COLONOSCOPY WITH PROPOFOL (N/A)  Patient Location: PACU  Anesthesia Type: MAC  Level of Consciousness: awake, alert  and patient cooperative  Airway and Oxygen Therapy: Patient Spontanous Breathing and Patient connected to supplemental oxygen  Post-op Assessment: Post-op Vital signs reviewed, Patient's Cardiovascular Status Stable, Respiratory Function Stable, Patent Airway and No signs of Nausea or vomiting  Post-op Vital Signs: Reviewed and stable  Complications: No apparent anesthesia complications

## 2015-07-06 NOTE — H&P (Signed)
[facility]  [address] [place], [place] [address] Phone: [phone] Fax : [phone]  Primary Care Physician:  Jill Durie, MD Primary Gastroenterologist:  Dr. Allen Norris  Pre-Procedure History & Physical: HPI:  Jill Porter is a 49 y.o. female is here for a screening colonoscopy.   Past Medical History  Diagnosis Date  . Recurrent cystitis   . Pelvic adhesions   . Melanoma   . Cervical dysphagia   . Climacteric   . Hypertension   . Motion sickness     anytime moving    Past Surgical History  Procedure Laterality Date  . Cholecystectomy  06/19/2012  . Melanoma excision  06/2012    Removed from Rt shoulder  . Basal cell carcinoma excision  2013    removed from forehead and Lt shooulder  . Breast cyst aspiration  01/2010  . Ovary surgery  1997    hx of removal of ovary and adhesions  . Abdominal hysterectomy  2000    due to pre cancerous cells  . Liposuction    . Exploratory laparotomy      lysis of adhesions  . Rotator cuff repair      Prior to Admission medications   Medication Sig Start Date End Date Taking? Authorizing Provider  estradiol (ESTRACE) 1 MG tablet Take 1 tablet (1 mg total) by mouth daily. 06/12/15  Yes Jill Slim Defrancesco, MD  hydrochlorothiazide (HYDRODIURIL) 25 MG tablet Take 25 mg by mouth daily. am   Yes Historical Provider, MD  losartan (COZAAR) 50 MG tablet Take 1 tablet (50 mg total) by mouth daily. 05/04/15  Yes Richard Maceo Pro., MD  zolpidem (AMBIEN) 10 MG tablet Take 10 mg by mouth at bedtime as needed for sleep.   Yes Historical Provider, MD    Allergies as of 06/16/2015 - Review Complete 06/16/2015  Allergen Reaction Noted  . Sulfa antibiotics Hives 03/31/2015  . Penicillins Itching and Rash 03/31/2015    Family History  Problem Relation Age of Onset  . Hypertension Mother   . Hypertension Father   . Heart attack Other   . Cancer Neg Hx   . Diabetes Neg Hx   . Heart disease Neg Hx      Social History   Social History  . Marital Status: Married    Spouse Name: N/A  . Number of Children: N/A  . Years of Education: N/A   Occupational History  . Not on file.   Social History Main Topics  . Smoking status: Never Smoker   . Smokeless tobacco: Not on file  . Alcohol Use: No  . Drug Use: No  . Sexual Activity: Yes    Birth Control/ Protection: Surgical   Other Topics Concern  . Not on file   Social History Narrative    Review of Systems: See HPI, otherwise negative ROS  Physical Exam: BP 132/78 mmHg  Pulse 102  Temp(Src) 97.3 F (36.3 C) (Temporal)  Resp 16  Ht 5\' 4"  (1.626 m)  Wt 121 lb (54.885 kg)  BMI 20.76 kg/m2  SpO2 100% General:   Alert,  pleasant and cooperative in NAD Head:  Normocephalic and atraumatic. Neck:  Supple; no masses or thyromegaly. Lungs:  Clear throughout to auscultation.    Heart:  Regular rate and rhythm. Abdomen:  Soft, nontender and nondistended. Normal bowel sounds, without guarding, and without rebound.   Neurologic:  Alert and  oriented x4;  grossly normal neurologically.  Impression/Plan: Jill Porter is  now here to undergo a screening colonoscopy.  Risks, benefits, and alternatives regarding colonoscopy have been reviewed with the patient.  Questions have been answered.  All parties agreeable.

## 2015-07-06 NOTE — Op Note (Signed)
[facility] Gastroenterology Patient Name: Jill Porter Procedure Date: 07/06/2015 7:37 AM MRN: [id number] Account #: [id number] Date of Birth: [birth year] Admit Type: Outpatient Age: 49 Room: [facility] OR ROOM 01 Gender: Female Note Status: Finalized Procedure:         Colonoscopy Indications:       Screening for colorectal malignant neoplasm Providers:         [name], MD Referring MD:      Janine Ores. Rosanna Randy, MD (Referring MD) Medicines:         Propofol per Anesthesia Complications:     No immediate complications. Procedure:         Pre-Anesthesia Assessment:                    - Prior to the procedure, a History and Physical was                     performed, and patient medications and allergies were                     reviewed. The patient's tolerance of previous anesthesia                     was also reviewed. The risks and benefits of the procedure                     and the sedation options and risks were discussed with the                     patient. All questions were answered, and informed consent                     was obtained. Prior Anticoagulants: The patient has taken                     no previous anticoagulant or antiplatelet agents. ASA                     Grade Assessment: II - A patient with mild systemic                     disease. After reviewing the risks and benefits, the                     patient was deemed in satisfactory condition to undergo                     the procedure.                    After obtaining informed consent, the colonoscope was                     passed under direct vision. Throughout the procedure, the                     patient's blood pressure, pulse, and oxygen saturations                     were monitored continuously. The was introduced through                     the anus and advanced to the the cecum, identified by  appendiceal orifice and ileocecal valve. The colonoscopy               was performed without difficulty. The patient tolerated                     the procedure well. The quality of the bowel preparation                     was excellent. Findings:      The perianal and digital rectal examinations were normal.      Non-bleeding internal hemorrhoids were found during retroflexion. The       hemorrhoids were Grade II (internal hemorrhoids that prolapse but reduce       spontaneously). Impression:        - Non-bleeding internal hemorrhoids.                    - No specimens collected. Recommendation:    - Repeat colonoscopy in 10 years for screening unless any                     change in family history or lower GI problems. Procedure Code(s): --- Professional ---                    [phone], Colonoscopy, flexible; diagnostic, including                     collection of specimen(s) by brushing or washing, when                     performed (separate procedure) Diagnosis Code(s): --- Professional ---                    Z12.11, Encounter for screening for malignant neoplasm of                     colon CPT copyright 2014 [employer] Medical Association. All rights reserved. The codes documented in this report are preliminary and upon coder review may  be revised to meet current compliance requirements. [name], MD 07/06/2015 8:20:02 AM This report has been signed electronically. Number of Addenda: 0 Note Initiated On: 07/06/2015 7:37 AM Scope Withdrawal Time: 0 hours 7 minutes 35 seconds  Total Procedure Duration: 0 hours 13 minutes 10 seconds       [facility]

## 2015-07-06 NOTE — Anesthesia Procedure Notes (Signed)
Procedure Name: MAC Performed by: Lula Michaux Pre-anesthesia Checklist: Patient identified, Emergency Drugs available, Suction available, Patient being monitored and Timeout performed Patient Re-evaluated:Patient Re-evaluated prior to inductionOxygen Delivery Method: Nasal cannula       

## 2015-07-07 ENCOUNTER — Encounter: Payer: Self-pay | Admitting: Gastroenterology

## 2015-07-08 ENCOUNTER — Other Ambulatory Visit: Payer: Self-pay

## 2015-07-08 MED ORDER — ZOLPIDEM TARTRATE 10 MG PO TABS: 10.0000 mg | ORAL_TABLET | Freq: Every evening | ORAL | Status: DC | PRN

## 2015-07-08 NOTE — Telephone Encounter (Signed)
Mail from patient please review-aa

## 2015-07-22 ENCOUNTER — Ambulatory Visit: Payer: Self-pay | Admitting: Obstetrics and Gynecology

## 2015-09-09 ENCOUNTER — Ambulatory Visit: Payer: BLUE CROSS/BLUE SHIELD | Admitting: Family Medicine

## 2015-09-16 ENCOUNTER — Encounter: Payer: Self-pay | Admitting: Family Medicine

## 2015-09-16 ENCOUNTER — Ambulatory Visit (INDEPENDENT_AMBULATORY_CARE_PROVIDER_SITE_OTHER): Payer: BLUE CROSS/BLUE SHIELD | Admitting: Family Medicine

## 2015-09-16 VITALS — BP 122/76 | HR 102 | Temp 98.0°F | Resp 16 | Wt 126.0 lb

## 2015-09-16 DIAGNOSIS — G4700 Insomnia, unspecified: Secondary | ICD-10-CM

## 2015-09-16 DIAGNOSIS — I10 Essential (primary) hypertension: Secondary | ICD-10-CM | POA: Diagnosis not present

## 2015-09-16 DIAGNOSIS — L659 Nonscarring hair loss, unspecified: Secondary | ICD-10-CM | POA: Diagnosis not present

## 2015-09-16 NOTE — Progress Notes (Signed)
Patient ID: Jill Porter, female   DOB: 06-Oct-1965, 49 y.o.   MRN: [id number]    Subjective:  HPI   Hypertension, follow-up:  BP Readings from Last 3 Encounters:  09/16/15 122/76  07/06/15 109/90  06/10/15 124/80    She was last seen for hypertension 4 months ago.  BP at that visit was 109/90. Management since that visit includes none. Was noted at [place] to check renal panel at next OV. She reports good compliance with treatment. She is not having side effects.  She is exercising. She is adherent to low salt diet.   [facility]. She is experiencing none.  Patient denies chest pain, chest pressure/discomfort, claudication, dyspnea, fatigue, irregular heart beat, lower extremity edema and palpitations.     Wt Readings from Last 3 Encounters:  09/16/15 126 lb (57.153 kg)  07/06/15 121 lb (54.885 kg)  06/10/15 123 lb (55.792 kg)    ------------------------------------------------------------------------      Prior to Admission medications   Medication Sig Start Date End Date Taking? Authorizing Provider  hydrochlorothiazide (HYDRODIURIL) 25 MG tablet Take 25 mg by mouth daily. am   Yes Historical Provider, MD  losartan (COZAAR) 50 MG tablet Take 1 tablet (50 mg total) by mouth daily. 05/04/15  Yes Jarae Nemmers Maceo Pro., MD  zolpidem (AMBIEN) 10 MG tablet Take 1 tablet (10 mg total) by mouth at bedtime as needed for sleep. 07/08/15  Yes Charlye Spare Maceo Pro., MD    Patient Active Problem List   Diagnosis Date Noted  . Special screening for malignant neoplasms, colon   . Perimenopausal vasomotor symptoms 06/10/2015  . Status post hysterectomy 06/10/2015  . History of cervical dysplasia 06/10/2015  . History of oophorectomy, unilateral 06/10/2015  . Cholelithiasis 03/31/2015  . Melanoma ([place]) 03/31/2015  . Hx of abnormal cervical Pap smear 03/31/2015  . Tinea versicolor 03/31/2015  . Vitamin D deficiency 03/31/2015  .  Hyperlipemia 03/31/2015  . Depression 03/31/2015  . Acute anxiety 03/31/2015  . Insomnia 03/31/2015  . Hypertension 03/31/2015  . Plantar fasciitis 03/31/2015  . Frequent UTI 03/31/2015  . Discoid eczema 03/31/2015    Past Medical History  Diagnosis Date  . Recurrent cystitis   . Pelvic adhesions   . Melanoma ([place])   . Cervical dysphagia   . Climacteric   . Hypertension   . Motion sickness     anytime moving    Social History   Social History  . Marital Status: Married    Spouse Name: N/A  . Number of Children: N/A  . Years of Education: N/A   Occupational History  . Not on file.   Social History Main Topics  . Smoking status: Never Smoker   . Smokeless tobacco: Not on file  . Alcohol Use: No  . Drug Use: No  . Sexual Activity: Yes    Birth Control/ Protection: Surgical   Other Topics Concern  . Not on file   Social History Narrative    Allergies  Allergen Reactions  . Sulfa Antibiotics Hives  . Penicillins Itching and Rash    Review of Systems  Constitutional: Negative.   HENT: Negative.   Eyes: Negative.   Respiratory: Negative.   Cardiovascular: Negative.   Gastrointestinal: Negative.   Genitourinary: Negative.   Musculoskeletal: Negative.   Skin: Negative.   Neurological: Negative.   Endo/Heme/Allergies: Negative.   Psychiatric/Behavioral: Negative.     Immunization History  Administered Date(s) Administered  . Influenza,inj,Quad PF,36+ Mos  06/10/2015   Objective:  BP 122/76 mmHg  Pulse 102  Temp(Src) 98 F (36.7 C) (Oral)  Resp 16  Wt 126 lb (57.153 kg)  Physical Exam  Constitutional: She is oriented to person, place, and time and well-developed, well-nourished, and in no distress.  HENT:  Head: Normocephalic and atraumatic.  Eyes: Conjunctivae and EOM are normal. Pupils are equal, round, and reactive to light.  Neck: Normal range of motion. Neck supple.  Cardiovascular: Normal rate, regular rhythm, normal heart sounds and  intact distal pulses.   Pulmonary/Chest: Effort normal and breath sounds normal.  Abdominal: Soft. Bowel sounds are normal.  Musculoskeletal: Normal range of motion.  Neurological: She is alert and oriented to person, place, and time. She has normal reflexes. Gait normal. GCS score is 15.  Skin: Skin is warm and dry.  Psychiatric: Mood, memory, affect and judgment normal.    Lab Results  Component Value Date   WBC 6.2 05/14/2013   HGB 13.8 05/14/2013   HCT 41 05/14/2013   PLT 288 05/14/2013   GLUCOSE 89 06/10/2015   CHOL 230* 06/10/2015   TRIG 109 06/10/2015   HDL 56 06/10/2015   LDLCALC 152* 06/10/2015   TSH 0.885 06/10/2015   HGBA1C 5.8* 06/10/2015    CMP     Component Value Date/Time   NA 142 05/14/2013   NA 138 06/19/2012 0747   K 4.6 05/14/2013   K 3.9 06/19/2012 0747   CL 103 06/19/2012 0747   CO2 30 06/19/2012 0747   GLUCOSE 89 06/10/2015 0953   GLUCOSE 88 06/19/2012 0747   BUN 12 05/14/2013   BUN 7 06/19/2012 0747   CREATININE 0.8 05/14/2013   CREATININE 0.90 06/19/2012 0747   CALCIUM 9.5 06/19/2012 0747   PROT 7.7 06/19/2012 0747   ALBUMIN 3.9 06/19/2012 0747   AST 15 05/14/2013   AST 10* 06/19/2012 0747   ALT 10 05/14/2013   ALT 16 06/19/2012 0747   ALKPHOS 55 05/14/2013   ALKPHOS 66 06/19/2012 0747   BILITOT 0.4 06/19/2012 0747   GFRNONAA >60 06/19/2012 0747   GFRAA >60 06/19/2012 0747    Assessment and Plan :  1. Essential hypertension  - Renal function panel  2. Insomnia Improved.  3. Alopecia  - TSH I have done the exam and reviewed the above chart and it is accurate to the best of my knowledge.  Patient was seen and examined by Dr. Miguel Aschoff, and noted scribed by Webb Laws, [place] MD [facility] 09/16/2015 2:26 PM

## 2015-09-17 LAB — RENAL FUNCTION PANEL
Albumin: 4.2 g/dL (ref 3.5–5.5)
BUN/Creatinine Ratio: 15 (ref 9–23)
BUN: 13 mg/dL (ref 6–24)
CO2: 27 mmol/L (ref 18–29)
Calcium: 10 mg/dL (ref 8.7–10.2)
Chloride: 97 mmol/L (ref 96–106)
Creatinine, Ser: 0.84 mg/dL (ref 0.57–1.00)
GFR calc Af Amer: 94 mL/min/{1.73_m2} (ref >59)
GFR calc non Af Amer: 81 mL/min/{1.73_m2} (ref >59)
Glucose: 91 mg/dL (ref 65–99)
Phosphorus: 3.3 mg/dL (ref 2.5–4.5)
Potassium: 3.6 mmol/L (ref 3.5–5.2)
Sodium: 137 mmol/L (ref 134–144)

## 2015-09-17 LAB — TSH: TSH: 0.648 u[IU]/mL (ref 0.450–4.500)

## 2015-09-21 ENCOUNTER — Ambulatory Visit (INDEPENDENT_AMBULATORY_CARE_PROVIDER_SITE_OTHER): Payer: BLUE CROSS/BLUE SHIELD | Admitting: Family Medicine

## 2015-09-21 VITALS — BP 122/80 | HR 92 | Temp 98.3°F | Resp 16 | Wt 125.0 lb

## 2015-09-21 DIAGNOSIS — Z01818 Encounter for other preprocedural examination: Secondary | ICD-10-CM | POA: Diagnosis not present

## 2015-09-21 NOTE — Progress Notes (Signed)
Patient ID: Jill Porter, female   DOB: [birth year], 49 y.o.   MRN: [id number]   Jill Porter  MRN: [id number] DOB: November 28, 1965  Subjective:  HPI   1. Pre-op evaluation The patient is a 49 year old female who presents for pre-op evaluation.  She is scheduled for surgery on 10/08/15.  She will be having her breast implants removed and not having them replaced.  She has not had a problem with them. Her surgeon is Dr. Darreld Mclean in [place], he is the surgeon that did the original augmentation.  She has not ever had any problems with anesthesia.  She is a non smoker, non drinker and has no heart, lung or bleeding disorders.  She does not have sleep apnea, but does use Ambien for insomnia.  Patient Active Problem List   Diagnosis Date Noted  . Special screening for malignant neoplasms, colon   . Perimenopausal vasomotor symptoms 06/10/2015  . Status post hysterectomy 06/10/2015  . History of cervical dysplasia 06/10/2015  . History of oophorectomy, unilateral 06/10/2015  . Cholelithiasis 03/31/2015  . Melanoma ([place]) 03/31/2015  . Hx of abnormal cervical Pap smear 03/31/2015  . Tinea versicolor 03/31/2015  . Vitamin D deficiency 03/31/2015  . Hyperlipemia 03/31/2015  . Depression 03/31/2015  . Acute anxiety 03/31/2015  . Insomnia 03/31/2015  . Hypertension 03/31/2015  . Plantar fasciitis 03/31/2015  . Frequent UTI 03/31/2015  . Discoid eczema 03/31/2015    Past Medical History  Diagnosis Date  . Recurrent cystitis   . Pelvic adhesions   . Melanoma ([place])   . Cervical dysphagia   . Climacteric   . Hypertension   . Motion sickness     anytime moving    Social History   Social History  . Marital Status: Married    Spouse Name: N/A  . Number of Children: N/A  . Years of Education: N/A   Occupational History  . Not on file.   Social History Main Topics  . Smoking status: Never Smoker   . Smokeless tobacco: Not on file  . Alcohol Use: No  . Drug Use: No  . Sexual  Activity: Yes    Birth Control/ Protection: Surgical   Other Topics Concern  . Not on file   Social History Narrative    Outpatient Prescriptions Prior to Visit  Medication Sig Dispense Refill  . hydrochlorothiazide (HYDRODIURIL) 25 MG tablet Take 25 mg by mouth daily. am    . losartan (COZAAR) 50 MG tablet Take 1 tablet (50 mg total) by mouth daily. 30 tablet 12  . zolpidem (AMBIEN) 10 MG tablet Take 1 tablet (10 mg total) by mouth at bedtime as needed for sleep. 30 tablet 5   No facility-administered medications prior to visit.    Allergies  Allergen Reactions  . Sulfa Antibiotics Hives  . Penicillins Itching and Rash    Review of Systems  Constitutional: Negative.   HENT: Negative.   Eyes: Negative.   Respiratory: Negative.   Cardiovascular: Negative.   Gastrointestinal: Negative.   Genitourinary: Negative.   Musculoskeletal: Negative.   Skin: Negative.   Neurological: Negative.   Endo/Heme/Allergies: Negative.   Psychiatric/Behavioral: The patient has insomnia.    Objective:  BP 122/80 mmHg  Pulse 92  Temp(Src) 98.3 F (36.8 C) (Oral)  Resp 16  Wt 125 lb (56.7 kg)  Physical Exam  Constitutional: She is oriented to person, place, and time and well-developed, well-nourished, and in no distress.  HENT:  Head: Normocephalic and atraumatic.  Right  Ear: External ear normal.  Left Ear: External ear normal.  Nose: Nose normal.  Mouth/Throat: Oropharyngeal exudate present.  Eyes: Conjunctivae are normal.  Neck: Neck supple.  Cardiovascular: Normal rate, regular rhythm, normal heart sounds and intact distal pulses.   Pulmonary/Chest: Effort normal and breath sounds normal.  Abdominal: Soft.  Neurological: She is alert and oriented to person, place, and time. Gait normal.  Grossly nonfocal.  Skin: Skin is warm and dry.  Psychiatric: Mood, memory, affect and judgment normal.    Assessment and Plan :  Pre-op evaluation Patient  medically cleared for removal  of breast implants 10/08/2015 I have done the exam and reviewed the above chart and it is accurate to the best of my knowledge.  Miguel Aschoff MD [facility] 09/21/2015 10:40 AM

## 2015-10-01 ENCOUNTER — Telehealth: Payer: Self-pay | Admitting: Emergency Medicine

## 2015-10-01 NOTE — Telephone Encounter (Signed)
LMTCB- regarding EKG for pre op.

## 2015-10-02 ENCOUNTER — Other Ambulatory Visit: Payer: Self-pay | Admitting: Emergency Medicine

## 2015-10-02 DIAGNOSIS — Z01818 Encounter for other preprocedural examination: Secondary | ICD-10-CM

## 2015-12-28 ENCOUNTER — Other Ambulatory Visit: Payer: Self-pay

## 2015-12-28 MED ORDER — ZOLPIDEM TARTRATE 10 MG PO TABS: 10.0000 mg | ORAL_TABLET | Freq: Every evening | ORAL | Status: DC | PRN

## 2015-12-28 NOTE — Telephone Encounter (Signed)
email from patient came for refill on Zolpidem, please review-aa

## 2016-01-20 ENCOUNTER — Other Ambulatory Visit: Payer: Self-pay | Admitting: Family Medicine

## 2016-05-17 ENCOUNTER — Other Ambulatory Visit: Payer: Self-pay | Admitting: Family Medicine

## 2016-05-17 DIAGNOSIS — I10 Essential (primary) hypertension: Secondary | ICD-10-CM

## 2016-06-19 IMAGING — CT CT NECK W/ CM
4 of 5 series · 15 of 33 positions shown, 18 images · IV contrast (omnipaque)
Comparison: None.

CLINICAL DATA: Right ear pain and globus sensation. Enlarged
tonsils.

EXAM:
CT NECK WITH CONTRAST
TECHNIQUE: Multidetector CT imaging of the neck was performed using the
standard protocol following the bolus administration of intravenous
contrast.
CONTRAST:  75mL OMNIPAQUE IOHEXOL 300 MG/ML  SOLN

[Series 2: axial · axial · 0.48mm/px · z∈[-474,-418]mm · 2 of 112 slices shown]
[im 28/112  bone]
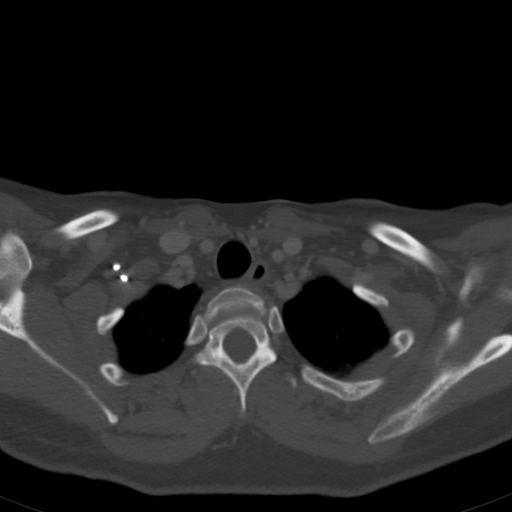
[im 56/112  bone]
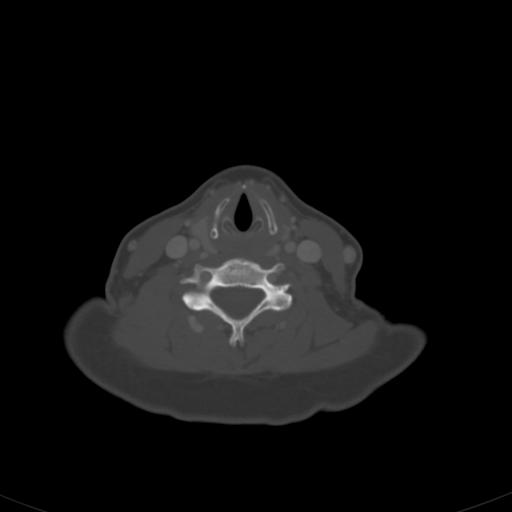

[Series 4: sag neck · sagittal · 0.44mm/px · 5 of 101 slices shown, 6 images]
[im 34/101  bone]
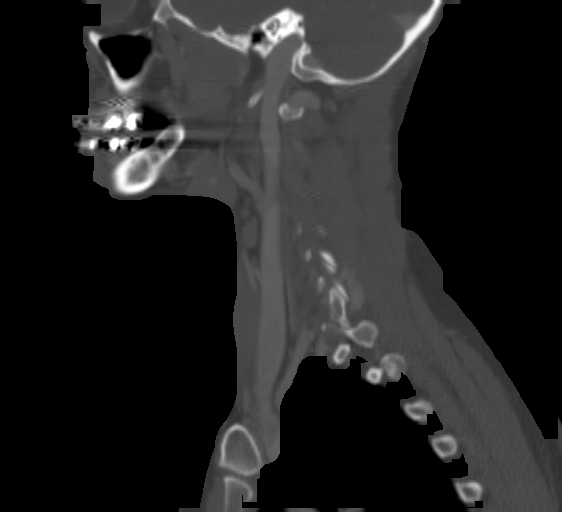
[im 42/101  bone]
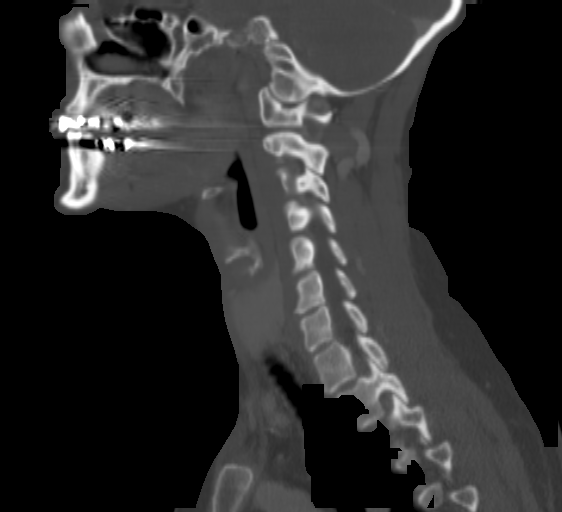
[im 51/101  soft-tissue]
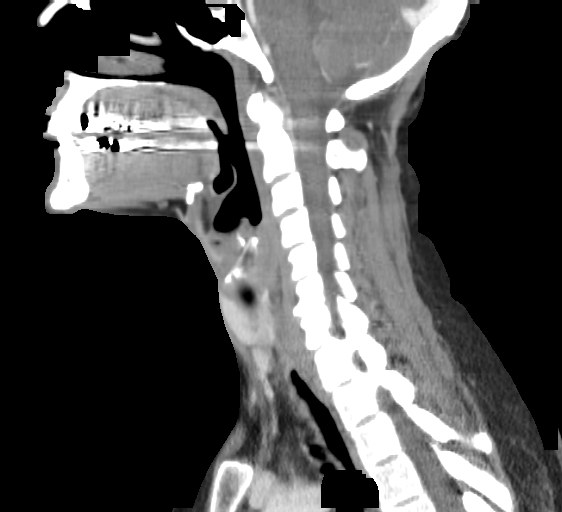
[im 51/101  bone]
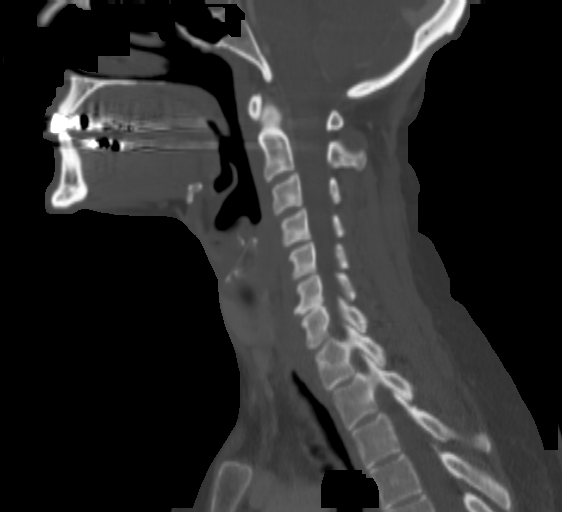
[im 59/101  bone]
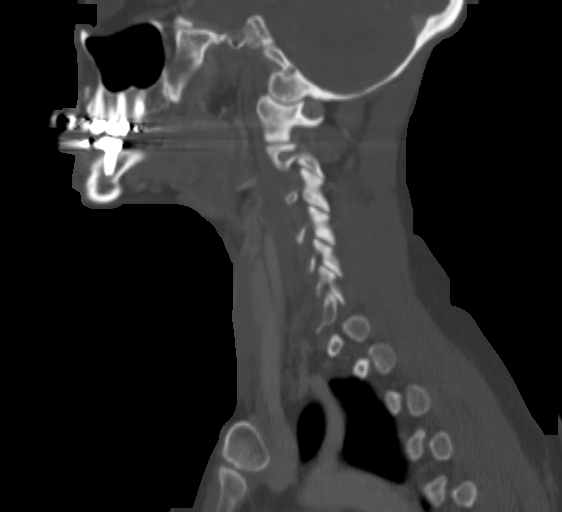
[im 67/101  bone]
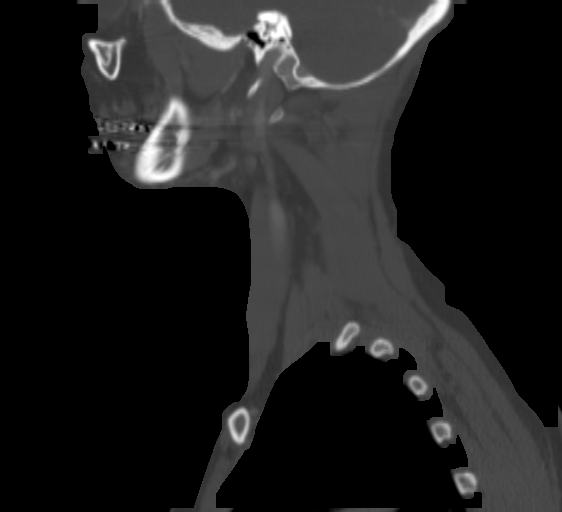

[Series 5: cor neck · coronal · 0.39mm/px · 3 of 103 slices shown]
[im 21/103  bone]
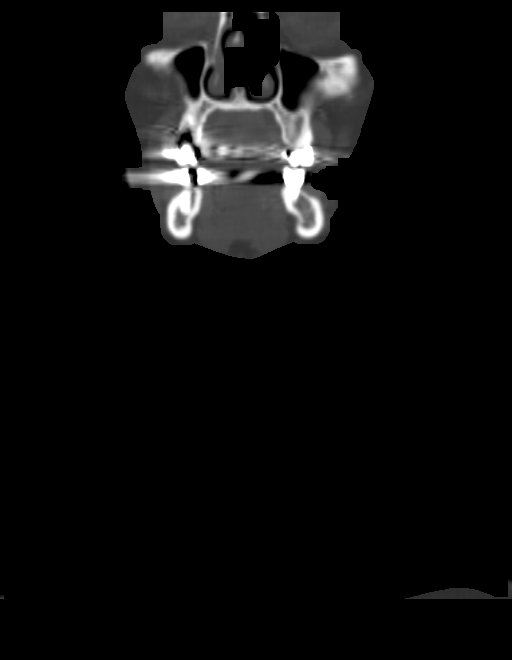
[im 41/103  bone]
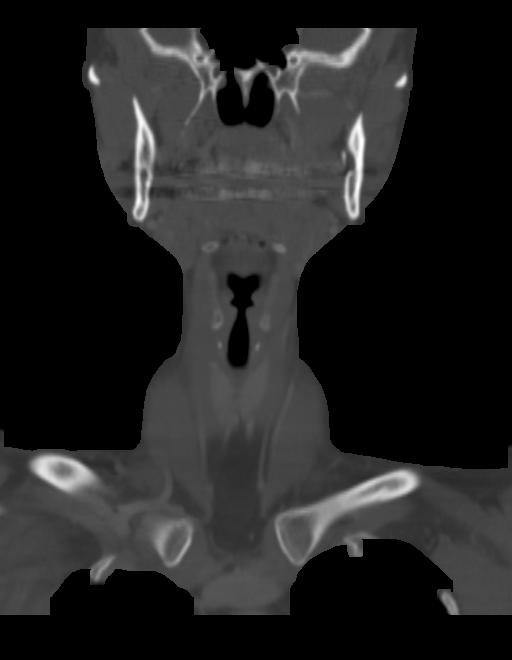
[im 62/103  bone]
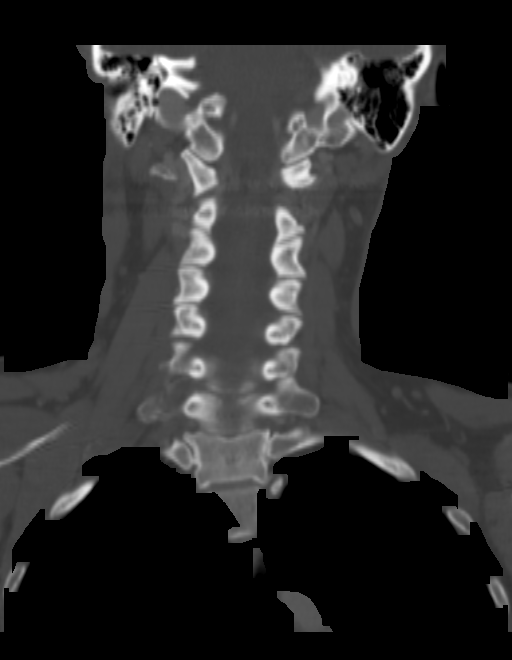

[Series 6: ax oropharynx · axial · 0.39mm/px · z∈[-537,-356]mm · 5 of 142 slices shown, 7 images]
[im 24/142  soft-tissue]
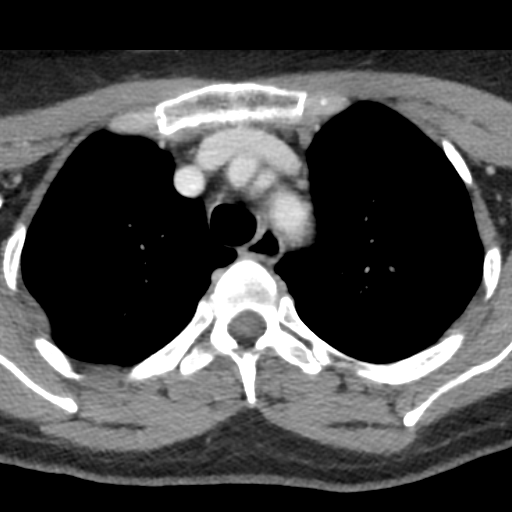
[im 24/142  bone]
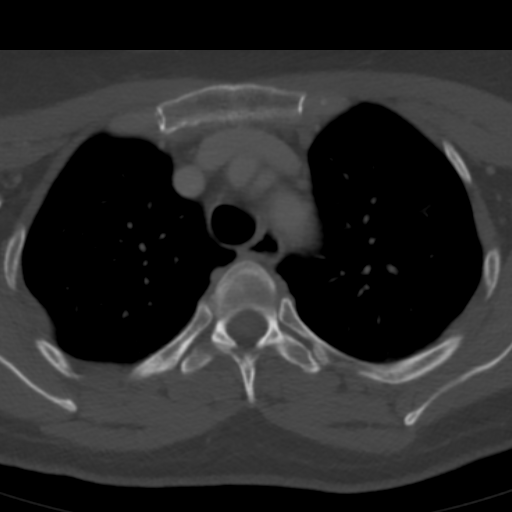
[im 48/142  bone]
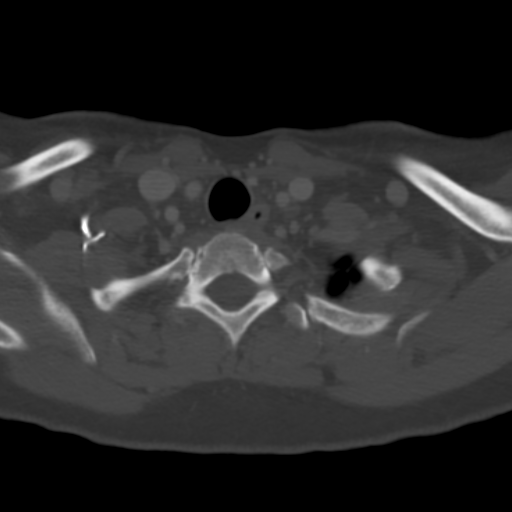
[im 71/142  bone]
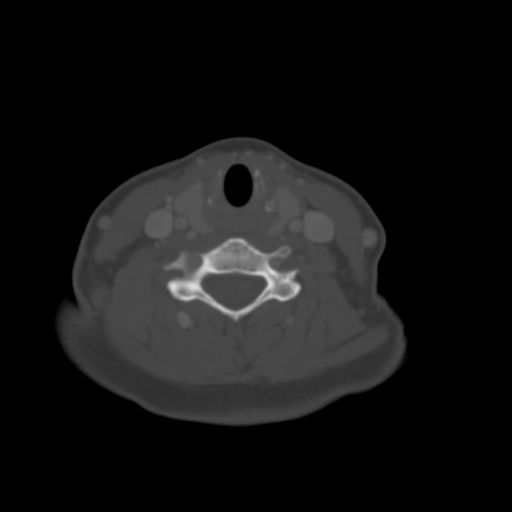
[im 95/142  bone]
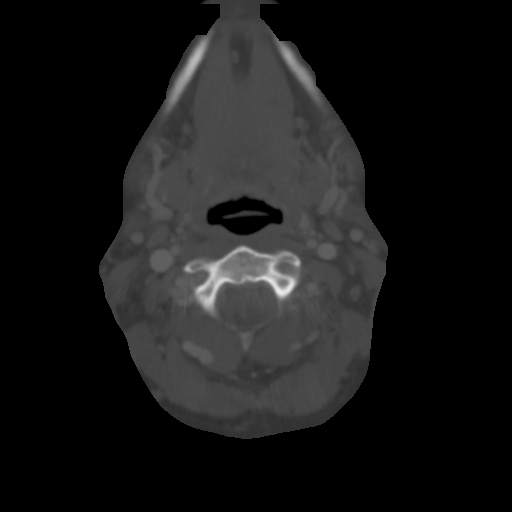
[im 118/142  soft-tissue]
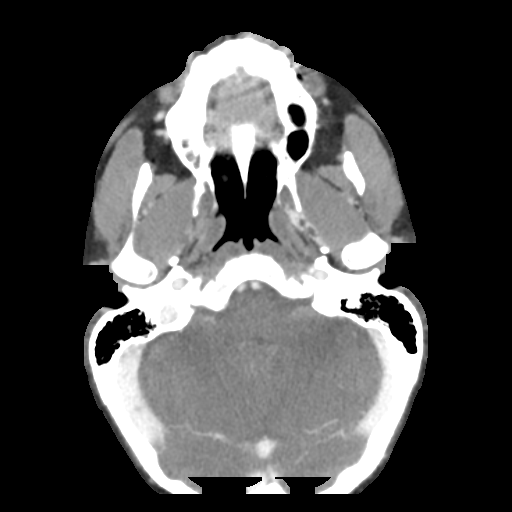
[im 118/142  bone]
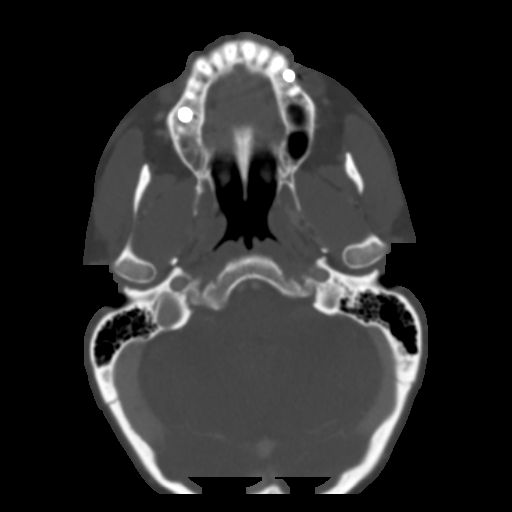

[15 of 33 positions shown; findings below may reference images not displayed]

FINDINGS: Pharynx and larynx: Symmetric nonspecific enlargement of the
palatine tonsils without discrete mass or ulceration. Streak
artifact from dental amalgam limits further assessment. The
remainder of the pharynx is unremarkable.

Salivary glands: Negative

Thyroid: 3 mm nodule in the left lobe, incidental based on small
size. An apparent nodular area near the upper pole on the left is
contiguous with thyroid. No suspected parathyroid adenoma.

Lymph nodes: No enlarged necrotic appearing lymph nodes.

Vascular: Negative

Limited intracranial: Negative

Visualized orbits: Partial imaging negative

Mastoids and visualized paranasal sinuses: Clear

Skeleton: Negative

Upper chest: Apical lungs are clear.
IMPRESSION: 1. No explanation for patient's symptoms.
2. Symmetric mild enlargement of the palatine tonsils.

## 2016-06-27 ENCOUNTER — Other Ambulatory Visit: Payer: Self-pay

## 2016-06-27 NOTE — Telephone Encounter (Signed)
Patient request Ambien refill. Please review-aa

## 2016-06-29 MED ORDER — ZOLPIDEM TARTRATE 10 MG PO TABS: 10.0000 mg | ORAL_TABLET | Freq: Every evening | ORAL | 5 refills | Status: DC | PRN

## 2016-10-24 ENCOUNTER — Ambulatory Visit
Admission: RE | Admit: 2016-10-24 | Discharge: 2016-10-24 | Disposition: A | Discharge status: Home / Self Care | Payer: Managed Care, Other (non HMO) | Source: Ambulatory Visit | Attending: Physician Assistant | Admitting: Physician Assistant

## 2016-10-24 ENCOUNTER — Encounter: Payer: Self-pay | Admitting: Physician Assistant

## 2016-10-24 ENCOUNTER — Ambulatory Visit (INDEPENDENT_AMBULATORY_CARE_PROVIDER_SITE_OTHER): Payer: Managed Care, Other (non HMO) | Admitting: Physician Assistant

## 2016-10-24 ENCOUNTER — Telehealth: Payer: Self-pay

## 2016-10-24 ENCOUNTER — Other Ambulatory Visit: Payer: Self-pay | Admitting: Physician Assistant

## 2016-10-24 VITALS — BP 128/90 | HR 96 | Temp 97.8°F | Resp 16 | Wt 129.0 lb

## 2016-10-24 DIAGNOSIS — R6889 Other general symptoms and signs: Secondary | ICD-10-CM | POA: Insufficient documentation

## 2016-10-24 DIAGNOSIS — R05 Cough: Secondary | ICD-10-CM

## 2016-10-24 DIAGNOSIS — R509 Fever, unspecified: Secondary | ICD-10-CM | POA: Diagnosis not present

## 2016-10-24 DIAGNOSIS — R059 Cough, unspecified: Secondary | ICD-10-CM

## 2016-10-24 MED ORDER — OSELTAMIVIR PHOSPHATE 75 MG PO CAPS: 75.0000 mg | ORAL_CAPSULE | Freq: Two times a day (BID) | ORAL | 0 refills | Status: AC

## 2016-10-24 NOTE — Progress Notes (Signed)
[place]  Chief Complaint  Patient presents with  . URI    Started about 8 days ago.     Subjective:    Patient ID: Jill Porter, female    DOB: Dec 30, 1965, 51 y.o.   MRN: [id number]  Upper Respiratory Infection: Jill Porter is a 51 y.o. female with a past medical history complaining of symptoms of a URI. Symptoms include congestion, cough, fever, sore throat and swollen glands. Onset of symptoms was 8 days ago, unchanged since that time. She also c/o achiness, congestion, low grade fever, non productive cough and post nasal drip for the past 8 days .  She is drinking plenty of fluids. Evaluation to date: none. Treatment to date: Tylenol. The treatment has provided minimal relief. She has not gotten her flu shot this year. 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  Review of Systems  Constitutional: Positive for appetite change, chills, diaphoresis, fatigue and fever. Negative for activity change and unexpected weight change.  HENT: Positive for congestion, postnasal drip and sore throat. Negative for ear discharge, ear pain, nosebleeds, rhinorrhea, sinus pain, sinus pressure and sneezing.   Respiratory: Positive for cough, chest tightness and shortness of breath. Negative for apnea, choking, wheezing and stridor.   Gastrointestinal: Negative.   Musculoskeletal: Positive for myalgias, neck pain and neck stiffness.  Neurological: Positive for headaches. Negative for dizziness and light-headedness.       Objective:   BP 128/90 (BP Location: Left Arm, Patient Position: Sitting, Cuff Size: Normal)   Pulse 96   Temp 97.8 F (36.6 C) (Oral)   Resp 16   Wt 129 lb (58.5 kg)   BMI 22.14 kg/m   Patient Active Problem List   Diagnosis Date Noted  . Special screening for malignant neoplasms, colon   . Perimenopausal vasomotor symptoms 06/10/2015  . Status post hysterectomy 06/10/2015  . History of cervical dysplasia 06/10/2015  .  History of oophorectomy, unilateral 06/10/2015  . Cholelithiasis 03/31/2015  . Melanoma ([place]) 03/31/2015  . Hx of abnormal cervical Pap smear 03/31/2015  . Tinea versicolor 03/31/2015  . Vitamin D deficiency 03/31/2015  . Hyperlipemia 03/31/2015  . Depression 03/31/2015  . Acute anxiety 03/31/2015  . Insomnia 03/31/2015  . Hypertension 03/31/2015  . Plantar fasciitis 03/31/2015  . Frequent UTI 03/31/2015  . Discoid eczema 03/31/2015    Outpatient Encounter Prescriptions as of 10/24/2016  Medication Sig  . hydrochlorothiazide (HYDRODIURIL) 25 MG tablet TAKE 1 TABLET BY MOUTH DAILY  . losartan (COZAAR) 50 MG tablet TAKE 1 TABLET (50 MG TOTAL) BY MOUTH DAILY.  Marland Kitchen zolpidem (AMBIEN) 10 MG tablet Take 1 tablet (10 mg total) by mouth at bedtime as needed for sleep.   No facility-administered encounter medications on file as of 10/24/2016.     Allergies  Allergen Reactions  . Sulfa Antibiotics Hives  . Penicillins Itching and Rash       Physical Exam  Constitutional: She appears well-developed and well-nourished. She appears ill. No distress.  Patient looks tired in office chair, has to lie down during course of exam because she feels light headed and hot.  HENT:  Opaque Tms bilaterally  Cardiovascular: Normal rate and regular rhythm.   Pulmonary/Chest: Effort normal and breath sounds normal. No respiratory distress. She has no wheezes. She has no rales.  Lymphadenopathy:    She has cervical adenopathy.  Skin: Skin is warm.  Skin is clammy to touch.  Psychiatric: She has a normal mood and affect.  Her behavior is normal.       Assessment & Plan:   1. Flu-like symptoms  Do suspect influenza, especially with patient's hx of non-vaccination. However, length of symptoms seems prolonged. Will evaluate further as below for other infectious causes, particularly pneumonia. Will call in Tamiflu if CXR is negative, abx if CXR indicates pneumonia. Have instructed patient to call pharmacy  if she doesn't hear from [place] by the end of the day for prescription.  - CBC With Differential - DG Chest 2 View; Future  2. Cough with fever  See above.  - CBC With Differential - DG Chest 2 View; Future  Return if symptoms worsen or fail to improve.  The entirety of the information documented in the History of Present Illness, Review of Systems and Physical Exam were personally obtained by me. Portions of this information were initially documented by Ashley Royalty, CMA and reviewed by me for thoroughness and accuracy.    Patient Instructions  Influenza, Adult Influenza ("the flu") is an infection in the lungs, nose, and throat (respiratory tract). It is caused by a virus. The flu causes many common cold symptoms, as well as a high fever and body aches. It can make you feel very sick. The flu spreads easily from person to person (is contagious). Getting a flu shot (influenza vaccination) every year is the best way to prevent the flu. Follow these instructions at home:  Take over-the-counter and prescription medicines only as told by your doctor.  Use a cool mist humidifier to add moisture (humidity) to the air in your home. This can make it easier to breathe.  Rest as needed.  Drink enough fluid to keep your pee (urine) clear or pale yellow.  Cover your mouth and nose when you cough or sneeze.  Wash your hands with soap and water often, especially after you cough or sneeze. If you cannot use soap and water, use hand sanitizer.  Stay home from work or school as told by your doctor. Unless you are visiting your doctor, try to avoid leaving home until your fever has been gone for 24 hours without the use of medicine.  Keep all follow-up visits as told by your doctor. This is important. How is this prevented?  Getting a yearly (annual) flu shot is the best way to avoid getting the flu. You may get the flu shot in late summer, fall, or winter. Ask your doctor when you should get your  flu shot.  Wash your hands often or use hand sanitizer often.  Avoid contact with people who are sick during cold and flu season.  Eat healthy foods.  Drink plenty of fluids.  Get enough sleep.  Exercise regularly. Contact a doctor if:  You get new symptoms.  You have:  Chest pain.  Watery poop (diarrhea).  A fever.  Your cough gets worse.  You start to have more mucus.  You feel sick to your stomach (nauseous).  You throw up (vomit). Get help right away if:  You start to be short of breath or have trouble breathing.  Your skin or nails turn a bluish color.  You have very bad pain or stiffness in your neck.  You get a sudden headache.  You get sudden pain in your face or ear.  You cannot stop throwing up. This information is not intended to replace advice given to you by your health care provider. Make sure you discuss any questions you have with your health care provider. Document Released:  06/28/2008 Document Revised: 02/25/2016 Document Reviewed: 07/14/2015 Elsevier Interactive Patient Education  2017 [employer].     The entirety of the information documented in the History of Present Illness, Review of Systems and Physical Exam were personally obtained by me. Portions of this information were initially documented by Ashley Royalty, CMA and reviewed by me for thoroughness and accuracy.

## 2016-10-24 NOTE — Patient Instructions (Signed)

## 2016-10-24 NOTE — Telephone Encounter (Signed)
Pt advised and agrees with treatment plan. [name], CMA  

## 2016-10-24 NOTE — Telephone Encounter (Signed)
-----   Message from [name], [place] sent at 10/24/2016  3:05 PM EST ----- CXR was clear, no evidence of pneumonia. Have sent in Tamiflu to pharmacy, one pill twice daily for five days. Can cause nausea. Please encourage patient to call back in if she worsens. Will await bloodwork.

## 2016-10-24 NOTE — Progress Notes (Signed)
CXR clear. Sent in [facility]iflu to [facility] on [address]

## 2016-10-25 ENCOUNTER — Telehealth: Payer: Self-pay

## 2016-10-25 LAB — CBC WITH DIFFERENTIAL
Basophils Absolute: 0 10*3/uL (ref 0.0–0.2)
Basos: 0 %
EOS (ABSOLUTE): 0.1 10*3/uL (ref 0.0–0.4)
Eos: 1 %
Hematocrit: 40.6 % (ref 34.0–46.6)
Hemoglobin: 13.9 g/dL (ref 11.1–15.9)
Immature Grans (Abs): 0 10*3/uL (ref 0.0–0.1)
Immature Granulocytes: 0 %
Lymphocytes Absolute: 2.3 10*3/uL (ref 0.7–3.1)
Lymphs: 34 %
MCH: 28.6 pg (ref 26.6–33.0)
MCHC: 34.2 g/dL (ref 31.5–35.7)
MCV: 84 fL (ref 79–97)
Monocytes Absolute: 0.5 10*3/uL (ref 0.1–0.9)
Monocytes: 8 %
Neutrophils Absolute: 3.9 10*3/uL (ref 1.4–7.0)
Neutrophils: 57 %
RBC: 4.86 x10E6/uL (ref 3.77–5.28)
RDW: 12.7 % (ref 12.3–15.4)
WBC: 6.9 10*3/uL (ref 3.4–10.8)

## 2016-10-25 NOTE — Telephone Encounter (Signed)
Tamiflu can cause headache. She doesn't have to take it if she doesn't want to, but it may help. Do still think this is the flu/viral infection. CBC was normal and I think it's reasonable to do some watchful waiting. Tylenol for headache if patient desires.

## 2016-10-25 NOTE — Telephone Encounter (Signed)
Pt advised.   Thanks,   -Laura  

## 2016-10-25 NOTE — Telephone Encounter (Signed)
Pt advised.  She reports she is not feeling any better.  Also the Tamiflu gave her a "Severe headache".  Thanks,   -Mickel Baas

## 2016-10-25 NOTE — Telephone Encounter (Signed)
-----   Message from [name], [place] sent at 10/25/2016 10:22 AM EST ----- CBC came back normal. How is patient feeling?

## 2016-11-07 ENCOUNTER — Ambulatory Visit (INDEPENDENT_AMBULATORY_CARE_PROVIDER_SITE_OTHER): Payer: Managed Care, Other (non HMO) | Admitting: Family Medicine

## 2016-11-07 ENCOUNTER — Encounter: Payer: Self-pay | Admitting: Family Medicine

## 2016-11-07 VITALS — BP 148/94 | HR 108 | Temp 97.7°F | Resp 16 | Wt 129.0 lb

## 2016-11-07 DIAGNOSIS — J0100 Acute maxillary sinusitis, unspecified: Secondary | ICD-10-CM

## 2016-11-07 MED ORDER — DOXYCYCLINE HYCLATE 100 MG PO TABS: 100.0000 mg | ORAL_TABLET | Freq: Two times a day (BID) | ORAL | 0 refills | Status: DC

## 2016-11-07 NOTE — Patient Instructions (Signed)
Saline nasal irrigation and plenty of fluids and rest.

## 2016-11-07 NOTE — Progress Notes (Signed)
Subjective:  HPI Pt reports that she was diagnosed with the flu about 2 weeks ago. She started feeling better for about 2 days then started feeling bad again with a low grade fever (99.0), cough with green, thick and bloody sputum, sinus pressure, pain  chest tightness, sweats, sore throat, ear ache, bilaterally, hot flashes about every 45 minutes with racing heart and sweats, then she is cold right after. Temp in office is 97.7 but she took tylenol at 8 am today.   Prior to Admission medications   Medication Sig Start Date End Date Taking? Authorizing Provider  hydrochlorothiazide (HYDRODIURIL) 25 MG tablet TAKE 1 TABLET BY MOUTH DAILY 01/20/16   [name]., MD  losartan (COZAAR) 50 MG tablet TAKE 1 TABLET (50 MG TOTAL) BY MOUTH DAILY. 05/17/16   Richard Maceo Pro., MD  zolpidem (AMBIEN) 10 MG tablet Take 1 tablet (10 mg total) by mouth at bedtime as needed for sleep. 06/29/16   Richard Maceo Pro., MD    Patient Active Problem List   Diagnosis Date Noted  . Special screening for malignant neoplasms, colon   . Perimenopausal vasomotor symptoms 06/10/2015  . Status post hysterectomy 06/10/2015  . History of cervical dysplasia 06/10/2015  . History of oophorectomy, unilateral 06/10/2015  . Cholelithiasis 03/31/2015  . Melanoma ([place]) 03/31/2015  . Hx of abnormal cervical Pap smear 03/31/2015  . Tinea versicolor 03/31/2015  . Vitamin D deficiency 03/31/2015  . Hyperlipemia 03/31/2015  . Depression 03/31/2015  . Acute anxiety 03/31/2015  . Insomnia 03/31/2015  . Hypertension 03/31/2015  . Plantar fasciitis 03/31/2015  . Frequent UTI 03/31/2015  . Discoid eczema 03/31/2015    Past Medical History:  Diagnosis Date  . Cervical dysphagia   . Climacteric   . Hypertension   . Melanoma ([place])   . Motion sickness    anytime moving  . Pelvic adhesions   . Recurrent cystitis     Social History   Social History  . Marital status: Married    Spouse name: N/A  .  Number of children: N/A  . Years of education: N/A   Occupational History  . Not on file.   Social History Main Topics  . Smoking status: Never Smoker  . Smokeless tobacco: Never Used  . Alcohol use No  . Drug use: No  . Sexual activity: Yes    Birth control/ protection: Surgical   Other Topics Concern  . Not on file   Social History Narrative  . No narrative on file    Allergies  Allergen Reactions  . Sulfa Antibiotics Hives  . Penicillins Itching and Rash    Review of Systems  Constitutional: Positive for chills, diaphoresis, fever and malaise/fatigue.  HENT: Positive for congestion, ear pain, sinus pain and sore throat.   Eyes: Negative.   Respiratory: Positive for cough, sputum production and shortness of breath.        Chest tightness   Cardiovascular: Negative.   Gastrointestinal: Negative.   Genitourinary: Negative.   Musculoskeletal: Negative.   Skin: Negative.   Neurological: Positive for headaches.  Endo/Heme/Allergies: Negative.   Psychiatric/Behavioral: Negative.     Immunization History  Administered Date(s) Administered  . Influenza,inj,Quad PF,36+ Mos 06/10/2015    Objective:  BP (!) 148/94 (BP Location: Left Arm, Patient Position: Sitting, Cuff Size: Normal)   Pulse (!) 108   Temp 97.7 F (36.5 C) (Oral)   Resp 16   Wt 129 lb (58.5 kg)   SpO2 98%  BMI 22.14 kg/m   Physical Exam  Constitutional: She is oriented to person, place, and time and well-developed, well-nourished, and in no distress.  HENT:  Head: Normocephalic and atraumatic.  Right Ear: External ear normal.  Left Ear: External ear normal.  Nose: Nose normal.  Mouth/Throat: Oropharynx is clear and moist.  Right maxillary sinus tenderness  Eyes: Conjunctivae and EOM are normal. Pupils are equal, round, and reactive to light.  Neck: Normal range of motion. Neck supple.  Cardiovascular: Normal rate, regular rhythm, normal heart sounds and intact distal pulses.     Pulmonary/Chest: Effort normal and breath sounds normal.  Musculoskeletal: Normal range of motion.  Neurological: She is alert and oriented to person, place, and time. She has normal reflexes. Gait normal. GCS score is 15.  Skin: Skin is warm and dry.  Psychiatric: Mood, memory, affect and judgment normal.    Lab Results  Component Value Date   WBC 6.9 10/24/2016   HGB 13.8 05/14/2013   HCT 40.6 10/24/2016   PLT 288 05/14/2013   GLUCOSE 91 09/16/2015   CHOL 230 (H) 06/10/2015   TRIG 109 06/10/2015   HDL 56 06/10/2015   LDLCALC 152 (H) 06/10/2015   TSH 0.648 09/16/2015   HGBA1C 5.8 (H) 06/10/2015    CMP     Component Value Date/Time   NA 137 09/16/2015 1452   NA 138 06/19/2012 0747   K 3.6 09/16/2015 1452   K 3.9 06/19/2012 0747   CL 97 09/16/2015 1452   CL 103 06/19/2012 0747   CO2 27 09/16/2015 1452   CO2 30 06/19/2012 0747   GLUCOSE 91 09/16/2015 1452   GLUCOSE 88 06/19/2012 0747   BUN 13 09/16/2015 1452   BUN 7 06/19/2012 0747   CREATININE 0.84 09/16/2015 1452   CREATININE 0.90 06/19/2012 0747   CALCIUM 10.0 09/16/2015 1452   CALCIUM 9.5 06/19/2012 0747   PROT 7.7 06/19/2012 0747   ALBUMIN 4.2 09/16/2015 1452   ALBUMIN 3.9 06/19/2012 0747   AST 15 05/14/2013   AST 10 (L) 06/19/2012 0747   ALT 10 05/14/2013   ALT 16 06/19/2012 0747   ALKPHOS 55 05/14/2013   ALKPHOS 66 06/19/2012 0747   BILITOT 0.4 06/19/2012 0747   GFRNONAA 81 09/16/2015 1452   GFRNONAA >60 06/19/2012 0747   GFRAA 94 09/16/2015 1452   GFRAA >60 06/19/2012 0747    Assessment and Plan :  1. Acute maxillary sinusitis, recurrence not specified--secondary to recent URI  - doxycycline (VIBRA-TABS) 100 MG tablet; Take 1 tablet (100 mg total) by mouth 2 (two) times daily.  Dispense: 20 tablet; Refill: 0   HPI, Exam, and A&P Transcribed under the direction and in the presence of Richard L. Cranford Mon, MD  Electronically Signed: Katina Dung, CMA I have done the exam and reviewed the  above chart and it is accurate to the best of my knowledge. Development worker, community has been used in this note in any air is in the dictation or transcription are unintentional.  [facility] 11/07/2016 10:12 AM

## 2016-11-14 ENCOUNTER — Encounter: Payer: Self-pay | Admitting: Family Medicine

## 2016-11-14 ENCOUNTER — Ambulatory Visit (INDEPENDENT_AMBULATORY_CARE_PROVIDER_SITE_OTHER): Payer: Managed Care, Other (non HMO) | Admitting: Family Medicine

## 2016-11-14 VITALS — BP 128/82 | HR 110 | Temp 97.6°F | Resp 18 | Wt 128.0 lb

## 2016-11-14 DIAGNOSIS — R5383 Other fatigue: Secondary | ICD-10-CM | POA: Diagnosis not present

## 2016-11-14 DIAGNOSIS — R0602 Shortness of breath: Secondary | ICD-10-CM | POA: Diagnosis not present

## 2016-11-14 DIAGNOSIS — R0789 Other chest pain: Secondary | ICD-10-CM

## 2016-11-14 DIAGNOSIS — R61 Generalized hyperhidrosis: Secondary | ICD-10-CM

## 2016-11-14 NOTE — Progress Notes (Signed)
Subjective:  HPI Pt reports that she is still having sweats, at rest, or active. She becomes nauseated, pulse rises and she has palpations, short of breath and the backs of her arms and hands turn red. She reports that she is having chest tightness on the left side and anything she does she "gives out" and has to lay down. She reports that she has episodes at least every hour. She has had hot flashes before and these are different. When she has the episodes she has to stop what she is doing and lay down. She reports that it took her all morning to get ready to come her because she gives out and has to lay down. She also has left arm pain that is all the way down her arm. She thought it was something like tennis elbow before but now with these other symptoms she's not sure. She reports that all this started about the same time she had the flu but her congestion and cough is better.   Prior to Admission medications   Medication Sig Start Date End Date Taking? Authorizing Provider  doxycycline (VIBRA-TABS) 100 MG tablet Take 1 tablet (100 mg total) by mouth 2 (two) times daily. 11/07/16   [name]., MD  hydrochlorothiazide (HYDRODIURIL) 25 MG tablet TAKE 1 TABLET BY MOUTH DAILY 01/20/16   [name]., MD  losartan (COZAAR) 50 MG tablet TAKE 1 TABLET (50 MG TOTAL) BY MOUTH DAILY. 05/17/16   Richard Maceo Pro., MD  zolpidem (AMBIEN) 10 MG tablet Take 1 tablet (10 mg total) by mouth at bedtime as needed for sleep. 06/29/16   Richard Maceo Pro., MD    Patient Active Problem List   Diagnosis Date Noted  . Special screening for malignant neoplasms, colon   . Perimenopausal vasomotor symptoms 06/10/2015  . Status post hysterectomy 06/10/2015  . History of cervical dysplasia 06/10/2015  . History of oophorectomy, unilateral 06/10/2015  . Cholelithiasis 03/31/2015  . Melanoma ([place]) 03/31/2015  . Hx of abnormal cervical Pap smear 03/31/2015  . Tinea versicolor 03/31/2015  .  Vitamin D deficiency 03/31/2015  . Hyperlipemia 03/31/2015  . Depression 03/31/2015  . Acute anxiety 03/31/2015  . Insomnia 03/31/2015  . Hypertension 03/31/2015  . Plantar fasciitis 03/31/2015  . Frequent UTI 03/31/2015  . Discoid eczema 03/31/2015    Past Medical History:  Diagnosis Date  . Cervical dysphagia   . Climacteric   . Hypertension   . Melanoma ([place])   . Motion sickness    anytime moving  . Pelvic adhesions   . Recurrent cystitis     Social History   Social History  . Marital status: Married    Spouse name: N/A  . Number of children: N/A  . Years of education: N/A   Occupational History  . Not on file.   Social History Main Topics  . Smoking status: Never Smoker  . Smokeless tobacco: Never Used  . Alcohol use No  . Drug use: No  . Sexual activity: Yes    Birth control/ protection: Surgical   Other Topics Concern  . Not on file   Social History Narrative  . No narrative on file    Allergies  Allergen Reactions  . Sulfa Antibiotics Hives  . Penicillins Itching and Rash    Review of Systems  Constitutional: Positive for diaphoresis and malaise/fatigue.  HENT: Negative.   Eyes: Negative.   Respiratory: Positive for cough and shortness of breath.  Cardiovascular: Positive for chest pain and palpitations.  Gastrointestinal: Positive for nausea.  Genitourinary: Negative.   Musculoskeletal: Negative.   Skin: Negative.   Neurological: Positive for weakness.  Endo/Heme/Allergies: Negative.   Psychiatric/Behavioral: Negative.     Immunization History  Administered Date(s) Administered  . Influenza,inj,Quad PF,36+ Mos 06/10/2015    Objective:  BP 128/82 (BP Location: Left Arm, Patient Position: Sitting, Cuff Size: Normal)   Pulse (!) 110   Temp 97.6 F (36.4 C) (Oral)   Resp 18   Wt 128 lb (58.1 kg)   SpO2 99%   BMI 21.97 kg/m   Physical Exam  Constitutional: She is oriented to person, place, and time and well-developed,  well-nourished, and in no distress.  Eyes: Conjunctivae and EOM are normal. Pupils are equal, round, and reactive to light.  Neck: Normal range of motion. Neck supple.  Cardiovascular: Normal rate, regular rhythm, normal heart sounds and intact distal pulses.   Pulmonary/Chest: Effort normal and breath sounds normal.  Abdominal: Soft. Bowel sounds are normal.  Musculoskeletal: Normal range of motion.  Neurological: She is alert and oriented to person, place, and time. She has normal reflexes. Gait normal. GCS score is 15.  Skin: Skin is warm and dry.  Psychiatric: Mood, memory, affect and judgment normal.    Lab Results  Component Value Date   WBC 6.9 10/24/2016   HGB 13.8 05/14/2013   HCT 40.6 10/24/2016   PLT 288 05/14/2013   GLUCOSE 91 09/16/2015   CHOL 230 (H) 06/10/2015   TRIG 109 06/10/2015   HDL 56 06/10/2015   LDLCALC 152 (H) 06/10/2015   TSH 0.648 09/16/2015   HGBA1C 5.8 (H) 06/10/2015    CMP     Component Value Date/Time   NA 137 09/16/2015 1452   NA 138 06/19/2012 0747   K 3.6 09/16/2015 1452   K 3.9 06/19/2012 0747   CL 97 09/16/2015 1452   CL 103 06/19/2012 0747   CO2 27 09/16/2015 1452   CO2 30 06/19/2012 0747   GLUCOSE 91 09/16/2015 1452   GLUCOSE 88 06/19/2012 0747   BUN 13 09/16/2015 1452   BUN 7 06/19/2012 0747   CREATININE 0.84 09/16/2015 1452   CREATININE 0.90 06/19/2012 0747   CALCIUM 10.0 09/16/2015 1452   CALCIUM 9.5 06/19/2012 0747   PROT 7.7 06/19/2012 0747   ALBUMIN 4.2 09/16/2015 1452   ALBUMIN 3.9 06/19/2012 0747   AST 15 05/14/2013   AST 10 (L) 06/19/2012 0747   ALT 10 05/14/2013   ALT 16 06/19/2012 0747   ALKPHOS 55 05/14/2013   ALKPHOS 66 06/19/2012 0747   BILITOT 0.4 06/19/2012 0747   GFRNONAA 81 09/16/2015 1452   GFRNONAA >60 06/19/2012 0747   GFRAA 94 09/16/2015 1452   GFRAA >60 06/19/2012 0747    Assessment and Plan :  1. Chest tightness I do not think this is ischemic. Have recommended aspirin daily. - EKG 12-Lead -  Ambulatory referral to Cardiology  2. Shortness of breath I do not think this is a pulmonary embolus. No findings suggestive of this. Viral cardiomyopathy is certainly a possibility. Obtain labs and refer to cardiology for evaluation. - EKG 12-Lead - Ambulatory referral to Cardiology  3. Diaphoresis  - CBC with Differential/Platelet - Ambulatory referral to Cardiology  4. Other fatigue Suspect cardiomyopathy since she recently getting over the flu. Refer to Dr. Rockey Situ to rule out.  - CBC with Differential/Platelet - Comprehensive metabolic panel - TSH - Ambulatory referral to Cardiology  HPI, Exam, and  A&P Transcribed under the direction and in the presence of Richard L. Cranford Mon, MD  Electronically Signed: Katina Dung, CMA I have done the exam and reviewed the above chart and it is accurate to the best of my knowledge. Development worker, community has been used in this note in any air is in the dictation or transcription are unintentional.  [facility] 11/14/2016 3:38 PM

## 2016-11-15 ENCOUNTER — Other Ambulatory Visit: Payer: Self-pay | Admitting: Emergency Medicine

## 2016-11-15 ENCOUNTER — Telehealth: Payer: Self-pay

## 2016-11-15 LAB — CBC WITH DIFFERENTIAL/PLATELET
Basophils Absolute: 0 10*3/uL (ref 0.0–0.2)
Basos: 0 %
EOS (ABSOLUTE): 0.1 10*3/uL (ref 0.0–0.4)
Eos: 1 %
Hematocrit: 39.7 % (ref 34.0–46.6)
Hemoglobin: 13.3 g/dL (ref 11.1–15.9)
Immature Grans (Abs): 0 10*3/uL (ref 0.0–0.1)
Immature Granulocytes: 0 %
Lymphocytes Absolute: 3 10*3/uL (ref 0.7–3.1)
Lymphs: 36 %
MCH: 28.2 pg (ref 26.6–33.0)
MCHC: 33.5 g/dL (ref 31.5–35.7)
MCV: 84 fL (ref 79–97)
Monocytes Absolute: 0.7 10*3/uL (ref 0.1–0.9)
Monocytes: 8 %
Neutrophils Absolute: 4.5 10*3/uL (ref 1.4–7.0)
Neutrophils: 55 %
Platelets: 337 10*3/uL (ref 150–379)
RBC: 4.71 x10E6/uL (ref 3.77–5.28)
RDW: 14 % (ref 12.3–15.4)
WBC: 8.3 10*3/uL (ref 3.4–10.8)

## 2016-11-15 LAB — COMPREHENSIVE METABOLIC PANEL
ALT: 21 IU/L (ref 0–32)
AST: 16 IU/L (ref 0–40)
Albumin/Globulin Ratio: 1.7 (ref 1.2–2.2)
Albumin: 4.7 g/dL (ref 3.5–5.5)
Alkaline Phosphatase: 66 IU/L (ref 39–117)
BUN/Creatinine Ratio: 20 (ref 9–23)
BUN: 20 mg/dL (ref 6–24)
Bilirubin Total: 0.4 mg/dL (ref 0.0–1.2)
CO2: 26 mmol/L (ref 18–29)
Calcium: 11 mg/dL — ABNORMAL HIGH (ref 8.7–10.2)
Chloride: 93 mmol/L — ABNORMAL LOW (ref 96–106)
Creatinine, Ser: 1.01 mg/dL — ABNORMAL HIGH (ref 0.57–1.00)
GFR calc Af Amer: 74 mL/min/{1.73_m2} (ref >59)
GFR calc non Af Amer: 65 mL/min/{1.73_m2} (ref >59)
Globulin, Total: 2.8 g/dL (ref 1.5–4.5)
Glucose: 98 mg/dL (ref 65–99)
Potassium: 4.4 mmol/L (ref 3.5–5.2)
Sodium: 136 mmol/L (ref 134–144)
Total Protein: 7.5 g/dL (ref 6.0–8.5)

## 2016-11-15 LAB — TSH: TSH: 1.33 u[IU]/mL (ref 0.450–4.500)

## 2016-11-15 NOTE — Telephone Encounter (Signed)
Pt came by to get lab slip and wanted to let you know that she went to ENT to have her tonsils looked at and he had an accidental finding of a thyroid nodule but said it was nothing for her to worry about. Pt did not know if this could be connected. She was going to get her other additional labs done today.

## 2016-11-15 NOTE — Telephone Encounter (Signed)
Patient was advised of lab report she stats that she is not currently on any calcium supplement. Notified patient that if she is not than we will be placing in order for labs to be drawn. KW

## 2016-11-15 NOTE — Telephone Encounter (Signed)
Called to add lab test on. [facility] will let us know if they do not have enough for these test to be added.

## 2016-11-15 NOTE — Telephone Encounter (Signed)
-----   Message from [name]., MD sent at 11/15/2016  8:26 AM EST ----- Labs okay except for elevated calcium. Stop any calcium supplements if she is taking them. If not then please order calcium, ionized calcium, PTH.

## 2016-11-16 LAB — PTH, INTACT AND CALCIUM
Calcium: 10.6 mg/dL — ABNORMAL HIGH (ref 8.7–10.2)
PTH: 42 pg/mL (ref 15–65)

## 2016-11-16 LAB — CALCIUM, IONIZED: Calcium, Ion: 5.7 mg/dL — ABNORMAL HIGH (ref 4.5–5.6)

## 2016-11-23 ENCOUNTER — Ambulatory Visit (INDEPENDENT_AMBULATORY_CARE_PROVIDER_SITE_OTHER): Payer: Managed Care, Other (non HMO) | Admitting: Family Medicine

## 2016-11-23 ENCOUNTER — Encounter: Payer: Self-pay | Admitting: Family Medicine

## 2016-11-23 VITALS — BP 138/90 | HR 108 | Temp 98.2°F | Resp 16 | Wt 128.0 lb

## 2016-11-23 DIAGNOSIS — R000 Tachycardia, unspecified: Secondary | ICD-10-CM | POA: Diagnosis not present

## 2016-11-23 DIAGNOSIS — L659 Nonscarring hair loss, unspecified: Secondary | ICD-10-CM

## 2016-11-23 DIAGNOSIS — R5383 Other fatigue: Secondary | ICD-10-CM | POA: Diagnosis not present

## 2016-11-23 DIAGNOSIS — E041 Nontoxic single thyroid nodule: Secondary | ICD-10-CM

## 2016-11-23 DIAGNOSIS — R61 Generalized hyperhidrosis: Secondary | ICD-10-CM | POA: Diagnosis not present

## 2016-11-23 NOTE — Progress Notes (Signed)
Patient: Jill Porter Female    DOB: 1965-12-30   51 y.o.   MRN: [id number] Visit Date: 11/23/2016  Today's Provider: Wilhemena Durie, MD   Chief Complaint  Patient presents with  . Excessive Sweating   Subjective:    HPI    Excessive Sweating Pt reports she has been experiencing intense heat/sweats that overcome patient on an hourly basis. Pt states the problem is so bad that it causes her hands to turn red. This drains the pt, and causes her to have to lay down. Have been occurring x 6 weeks. The sweats are causing sleep disturbance. Pt reports this feels different than menopausal hot flashes. Pt experiences chills after the episodes are over. Pt feels presyncopal after the episodes. Pt has recently had labs done, and her calcium came back elevated, Pt is also c/o hair loss and myalgias. Pt had CT of neck done on 06/16/2015, and pt states that ENT found an incidental thyroid nodule. Pt is concerned that her sx are caused by a thyroid abnormality.  Cold Symptoms Pt is c/o sneezing, rhinorrhea, scratchy throat, dry cough since yesterday. Pt was recently treated for sinusitis on 11/07/2016, and she is concerned that the sinusitis is reoccurring.  Allergies  Allergen Reactions  . Sulfa Antibiotics Hives  . Penicillins Itching and Rash     Current Outpatient Prescriptions:  .  aspirin EC 81 MG tablet, Take 81 mg by mouth daily., Disp: , Rfl:  .  hydrochlorothiazide (HYDRODIURIL) 25 MG tablet, TAKE 1 TABLET BY MOUTH DAILY, Disp: 30 tablet, Rfl: 11 .  losartan (COZAAR) 50 MG tablet, TAKE 1 TABLET (50 MG TOTAL) BY MOUTH DAILY., Disp: 30 tablet, Rfl: 11 .  zolpidem (AMBIEN) 10 MG tablet, Take 1 tablet (10 mg total) by mouth at bedtime as needed for sleep., Disp: 30 tablet, Rfl: 5  Review of Systems  Constitutional: Positive for chills, diaphoresis and fatigue.  HENT: Positive for rhinorrhea, sneezing and sore throat (scratchy).   Eyes: Negative.   Respiratory: Positive for  cough and shortness of breath.   Cardiovascular: Positive for chest pain (tightness, has tried ASA without relief) and palpitations.  Gastrointestinal: Negative.   Endocrine: Negative.   Genitourinary: Negative.   Musculoskeletal: Positive for myalgias.  Allergic/Immunologic: Negative.   Neurological: Negative.   Psychiatric/Behavioral: Positive for sleep disturbance.    Social History  Substance Use Topics  . Smoking status: Never Smoker  . Smokeless tobacco: Never Used  . Alcohol use No   Objective:   BP 138/90 (BP Location: Right Arm, Patient Position: Sitting, Cuff Size: Normal)   Pulse (!) 108   Temp 98.2 F (36.8 C) (Oral)   Resp 16   Wt 128 lb (58.1 kg)   SpO2 99%   BMI 21.97 kg/m   Physical Exam  Constitutional: She is oriented to person, place, and time. She appears well-developed and well-nourished.  HENT:  Head: Normocephalic and atraumatic.  Right Ear: External ear normal.  Left Ear: External ear normal.  Nose: Nose normal.  Eyes: Conjunctivae are normal.  Neck: Neck supple. No thyromegaly present.  Cardiovascular: Normal rate, regular rhythm, normal heart sounds and intact distal pulses.   Pulmonary/Chest: Effort normal and breath sounds normal.  Abdominal: Soft.  Lymphadenopathy:    She has no cervical adenopathy.  Neurological: She is alert and oriented to person, place, and time.  Skin: Skin is warm and dry.  Psychiatric: She has a normal mood and affect. Her behavior is  normal. Judgment and thought content normal.        Assessment & Plan:     1. Fatigue, unspecified type Post viral cardiomyopathy possible. - CBC with Differential/Platelet - Comprehensive metabolic panel  2. Night sweats Doubt SBE. No fevers Seeing cardiology.  3. Tachycardia   4. Alopecia   5. Thyroid nodule   6. Hypercalcemia May do chest Abd CT. - TSH - Sedimentation rate - T4, free - Calcium, ionized     Patient seen and examined by Miguel Aschoff, MD,  and note scribed by Renaldo Fiddler, CMA. I have done the exam and reviewed the above chart and it is accurate to the best of my knowledge. Development worker, community has been used in this note in any air is in the dictation or transcription are unintentional.  Wilhemena Durie, MD  [facility]

## 2016-11-24 ENCOUNTER — Ambulatory Visit (INDEPENDENT_AMBULATORY_CARE_PROVIDER_SITE_OTHER): Payer: Managed Care, Other (non HMO) | Admitting: Cardiovascular Disease

## 2016-11-24 ENCOUNTER — Encounter: Payer: Self-pay | Admitting: Cardiovascular Disease

## 2016-11-24 VITALS — BP 136/98 | HR 99 | Ht 64.0 in | Wt 127.2 lb

## 2016-11-24 DIAGNOSIS — I10 Essential (primary) hypertension: Secondary | ICD-10-CM

## 2016-11-24 DIAGNOSIS — R079 Chest pain, unspecified: Secondary | ICD-10-CM

## 2016-11-24 DIAGNOSIS — R0602 Shortness of breath: Secondary | ICD-10-CM | POA: Diagnosis not present

## 2016-11-24 DIAGNOSIS — R61 Generalized hyperhidrosis: Secondary | ICD-10-CM

## 2016-11-24 DIAGNOSIS — R000 Tachycardia, unspecified: Secondary | ICD-10-CM

## 2016-11-24 DIAGNOSIS — E785 Hyperlipidemia, unspecified: Secondary | ICD-10-CM

## 2016-11-24 LAB — COMPREHENSIVE METABOLIC PANEL
ALT: 14 IU/L (ref 0–32)
AST: 16 IU/L (ref 0–40)
Albumin/Globulin Ratio: 1.6 (ref 1.2–2.2)
Albumin: 4.6 g/dL (ref 3.5–5.5)
Alkaline Phosphatase: 57 IU/L (ref 39–117)
BUN/Creatinine Ratio: 12 (ref 9–23)
BUN: 10 mg/dL (ref 6–24)
Bilirubin Total: 0.3 mg/dL (ref 0.0–1.2)
CO2: 27 mmol/L (ref 18–29)
Calcium: 10.6 mg/dL — ABNORMAL HIGH (ref 8.7–10.2)
Chloride: 96 mmol/L (ref 96–106)
Creatinine, Ser: 0.85 mg/dL (ref 0.57–1.00)
GFR calc Af Amer: 92 (ref >59)
GFR calc non Af Amer: 80 (ref >59)
Globulin, Total: 2.9 (ref 1.5–4.5)
Glucose: 92 mg/dL (ref 65–99)
Potassium: 4.1 mmol/L (ref 3.5–5.2)
Sodium: 139 mmol/L (ref 134–144)
Total Protein: 7.5 g/dL (ref 6.0–8.5)

## 2016-11-24 LAB — CBC WITH DIFFERENTIAL/PLATELET
Basophils Absolute: 0 10*3/uL (ref 0.0–0.2)
Basos: 1 %
EOS (ABSOLUTE): 0.1 10*3/uL (ref 0.0–0.4)
Eos: 2 %
Hematocrit: 35.3 % (ref 34.0–46.6)
Hemoglobin: 11.7 g/dL (ref 11.1–15.9)
Immature Grans (Abs): 0 10*3/uL (ref 0.0–0.1)
Immature Granulocytes: 0 %
Lymphocytes Absolute: 1.6 10*3/uL (ref 0.7–3.1)
Lymphs: 40 %
MCH: 27.7 pg (ref 26.6–33.0)
MCHC: 33.1 g/dL (ref 31.5–35.7)
MCV: 84 fL (ref 79–97)
Monocytes Absolute: 0.4 10*3/uL (ref 0.1–0.9)
Monocytes: 10 %
Neutrophils Absolute: 2 10*3/uL (ref 1.4–7.0)
Neutrophils: 47 %
Platelets: 283 10*3/uL (ref 150–379)
RBC: 4.22 x10E6/uL (ref 3.77–5.28)
RDW: 14.2 % (ref 12.3–15.4)
WBC: 4.1 10*3/uL (ref 3.4–10.8)

## 2016-11-24 LAB — CALCIUM, IONIZED: Calcium, Ion: 5.6 mg/dL (ref 4.5–5.6)

## 2016-11-24 LAB — T4, FREE: Free T4: 1.19 ng/dL (ref 0.82–1.77)

## 2016-11-24 LAB — SEDIMENTATION RATE: Sed Rate: 7 mm/hr (ref 0–40)

## 2016-11-24 LAB — TSH: TSH: 1.16 u[IU]/mL (ref 0.450–4.500)

## 2016-11-24 NOTE — Progress Notes (Signed)
Cardiology Office Note  Date:  11/24/2016   ID:  Jill Porter, Jill Porter 1965/12/08, MRN [id number]  PCP:  Wilhemena Durie, MD   Chief Complaint  Patient presents with  . other    Ref by Dr. Rosanna Randy for chest pain. Pt. c/o breaking out into a sweat, heart racing/pounding, constant bilateral arm and elbow pain and chest tightness and shortness of breath.     HPI:   Jill Porter is a very pleasant 52 year old woman with history of hypertension who presents by referral from Dr. Rosanna Randy for consultation of her tachycardia, shortness of breath, malaise , flushing .  She reports that symptoms started in December with elbow pain mid January 2018 with viral-type symptoms Lasted for 1-2 weeks then she was seen by Dr. Rosanna Randy in the office She reports having flu-type symptoms She followed up with doxycycline for sinus pressure possible sinusitis Despite this she has continued to have episodes of profound flushing, malaise associated with redness developing in her hands for short bursts, with tachycardia  She's had persistent shortness of breath on exertion, persistent fatigue These episodes of profound flushing and tachycardia last for several minutes at a time, happen almost on an hourly basis  She had one in the office during exam got very hot, diaphoretic, flushed, fingers when read, red around the neck, had sinus tachycardia rate likely greater than 120 bpm,  Symptoms recovered relatively quickly  EKG on today's visit shows normal sinus rhythm rate 99 bpm no significant ST or T-wave changes    PMH:   has a past medical history of Cervical dysphagia; Climacteric; Hypertension; Melanoma ([place]); Motion sickness; Pelvic adhesions; and Recurrent cystitis.  PSH:    Past Surgical History:  Procedure Laterality Date  . ABDOMINAL HYSTERECTOMY  2000   due to pre cancerous cells  . BASAL CELL CARCINOMA EXCISION  2013   removed from forehead and Lt [place]  . BREAST CYST ASPIRATION  01/2010  .  CHOLECYSTECTOMY  06/19/2012  . COLONOSCOPY WITH PROPOFOL N/A 07/06/2015   Procedure: COLONOSCOPY WITH PROPOFOL;  Surgeon: [name], MD;  Location: [facility];  Service: Endoscopy;  Laterality: N/A;  . EXPLORATORY LAPAROTOMY     lysis of adhesions  . LIPOSUCTION    . MELANOMA EXCISION  06/2012   Removed from Rt shoulder  . OVARY SURGERY  1997   hx of removal of ovary and adhesions  . ROTATOR CUFF REPAIR      Current Outpatient Prescriptions  Medication Sig Dispense Refill  . aspirin EC 81 MG tablet Take 81 mg by mouth daily.    . hydrochlorothiazide (HYDRODIURIL) 25 MG tablet TAKE 1 TABLET BY MOUTH DAILY 30 tablet 11  . losartan (COZAAR) 50 MG tablet TAKE 1 TABLET (50 MG TOTAL) BY MOUTH DAILY. 30 tablet 11  . zolpidem (AMBIEN) 10 MG tablet Take 1 tablet (10 mg total) by mouth at bedtime as needed for sleep. 30 tablet 5   No current facility-administered medications for this visit.      Allergies:   Sulfa antibiotics and Penicillins   Social History:  The patient  reports that she has never smoked. She has never used smokeless tobacco. She reports that she does not drink alcohol or use drugs.   Family History:   family history includes Heart attack (age of onset: 8) in her maternal grandfather; Hypertension in her father and mother.    Review of Systems: Review of Systems  Constitutional: Positive for diaphoresis and malaise/fatigue.  Respiratory: Positive for  shortness of breath.   Cardiovascular: Positive for palpitations.  Gastrointestinal: Negative.   Musculoskeletal: Negative.   Neurological: Negative.   Psychiatric/Behavioral: Negative.   All other systems reviewed and are negative.    PHYSICAL EXAM: VS:  BP (!) 136/98 (BP Location: Left Arm, Patient Position: Sitting, Cuff Size: Normal)   Pulse 99   Ht 5\' 4"  (1.626 m)   Wt 127 lb 4 oz (57.7 kg)   BMI 21.84 kg/m  , BMI Body mass index is 21.84 kg/m. GEN: Well nourished, well developed, in no acute  distress  HEENT: normal  Neck: no JVD, carotid bruits, or masses Cardiac: Regular rhythm, tachycardia,; no murmurs, rubs, or gallops,no edema  Respiratory:  clear to auscultation bilaterally, normal work of breathing GI: soft, nontender, nondistended, + BS Jill: no deformity or atrophy  Skin: warm and dry, no rash Neuro:  Strength and sensation are intact Psych: euthymic mood, full affect    Recent Labs: 11/23/2016: ALT 14; BUN 10; Creatinine, Ser 0.85; Platelets 283; Potassium 4.1; Sodium 139; TSH 1.160    Lipid Panel Lab Results  Component Value Date   CHOL 230 (H) 06/10/2015   HDL 56 06/10/2015   LDLCALC 152 (H) 06/10/2015   TRIG 109 06/10/2015      Wt Readings from Last 3 Encounters:  11/24/16 127 lb 4 oz (57.7 kg)  11/23/16 128 lb (58.1 kg)  11/14/16 128 lb (58.1 kg)       ASSESSMENT AND PLAN:  Hyperlipidemia, unspecified hyperlipidemia type - Plan: EKG 12-Lead, ECHOCARDIOGRAM COMPLETE No recent lipid panel available.  Essential hypertension - Plan: EKG 12-Lead, ECHOCARDIOGRAM COMPLETE We did discuss her blood pressure medications. If she needs symptom relief in terms of her heart rate, potentially could decrease HCTZ, start beta blocker  SOB (shortness of breath) - Plan: ECHOCARDIOGRAM COMPLETE Shortness of breath likely secondary to residual effect from her virus one month ago Echocardiogram ordered to rule out structural heart disease  Tachycardia - Plan: ECHOCARDIOGRAM COMPLETE Likely having sinus tachycardia in response to her flushing/diaphoresis episodes We did offer low-dose beta blockers, she declined at this time Review of previous notes from prior office visits dating back to 2016 shows periods of tachycardia back then, rate up to 110. Asymptomatic at the time  Diaphoresis Episode witnessed in the office with redness around the neck, red fingers, diaphoresis Felt very hot, sinus tachycardia, symptoms lasting for 3 minutes Then felt fine. Etiology  unclear, echocardiogram ordered to rule out pericardial effusion or other cardiac issues associated with recent virus one month ago. Other lab work that may be of benefit our CRP Recommended she try Tylenol with NSAIDs for symptom relief Unclear if there is a hormonal component Likely unrelated to calcium levels given numbers are not particularly elevated and given rapid onset in the past month. If no relief she may need infectious disease evaluation . potentially could order blood cultures though less likely bacterial.   Total encounter time more than 60 minutes  Greater than 50% was spent in counseling and coordination of care with the patient  Patient was seen in consultation for Dr. Rosanna Randy and will be referred back to his office for continued care  Disposition:   F/U  as needed We will call her with results of her echocardiogram   Orders Placed This Encounter  Procedures  . EKG 12-Lead  . ECHOCARDIOGRAM COMPLETE     Signed, Esmond Plants, M.D., Ph.D. 11/24/2016  [place], [place]

## 2016-11-24 NOTE — Patient Instructions (Addendum)
Medication Instructions:   No medication changes made  Labwork:  No new labs needed  Testing/Procedures:  We will order an echocardiogram for tachycardia, SOB Echocardiography is a painless test that uses sound waves to create images of your heart. It provides your doctor with information about the size and shape of your heart and how well your heart's chambers and valves are working. This procedure takes approximately one hour. There are no restrictions for this procedure.  I recommend watching educational videos on topics of interest to you at:       [email or web address].com  Enter code: HEARTCARE    Follow-Up: It was a pleasure seeing you in the office today. Please call us if you have new issues that need to be addressed before your next appt.  [phone]  Your physician wants you to follow-up in: as needed We will call with the results of the echocardiogram  If you need a refill on your cardiac medications before your next appointment, please call your pharmacy.    Echocardiogram An echocardiogram, or echocardiography, uses sound waves (ultrasound) to produce an image of your heart. The echocardiogram is simple, painless, obtained within a short period of time, and offers valuable information to your health care provider. The images from an echocardiogram can provide information such as:  Evidence of coronary artery disease (CAD).  Heart size.  Heart muscle function.  Heart valve function.  Aneurysm detection.  Evidence of a past heart attack.  Fluid buildup around the heart.  Heart muscle thickening.  Assess heart valve function. Tell a health care provider about:  Any allergies you have.  All medicines you are taking, including vitamins, herbs, eye drops, creams, and over-the-counter medicines.  Any problems you or family members have had with anesthetic medicines.  Any blood disorders you have.  Any surgeries you have had.  Any medical conditions you  have.  Whether you are pregnant or may be pregnant. What happens before the procedure? No special preparation is needed. Eat and drink normally. What happens during the procedure?  In order to produce an image of your heart, gel will be applied to your chest and a wand-like tool (transducer) will be moved over your chest. The gel will help transmit the sound waves from the transducer. The sound waves will harmlessly bounce off your heart to allow the heart images to be captured in real-time motion. These images will then be recorded.  You may need an IV to receive a medicine that improves the quality of the pictures. What happens after the procedure? You may return to your normal schedule including diet, activities, and medicines, unless your health care provider tells you otherwise. This information is not intended to replace advice given to you by your health care provider. Make sure you discuss any questions you have with your health care provider. Document Released: 09/16/2000 Document Revised: 05/07/2016 Document Reviewed: 05/27/2013 Elsevier Interactive Patient Education  2017 [employer].

## 2016-11-25 ENCOUNTER — Telehealth: Payer: Self-pay

## 2016-11-25 MED ORDER — METOPROLOL TARTRATE 25 MG PO TABS: 12.5000 mg | ORAL_TABLET | Freq: Two times a day (BID) | ORAL | 3 refills | Status: DC

## 2016-11-25 NOTE — Telephone Encounter (Signed)
Yes to Beta blocker

## 2016-11-25 NOTE — Telephone Encounter (Signed)
Metoprolol 12.5mg  BID. I will review info from Dr Rockey Situ.

## 2016-11-25 NOTE — Telephone Encounter (Signed)
Which beta blocker would you prefer? And would you like to see if we can get the [place] scheduled sooner? Please advise. Thanks!

## 2016-11-25 NOTE — Telephone Encounter (Signed)
Advised husband as below. Medication was sent into the pharmacy.

## 2016-11-25 NOTE — Telephone Encounter (Signed)
Mr. Meisler called reporting that Mrs. Wichman saw Dr. Rockey Situ (Cardiology) yesterday.  Dr. Rockey Situ order an U/S.  Per Mr. Lahti the [place] in not scheduled until next month.  He is concerned that is too long of a wait.  He states she runs a low grade fever every night and does not feel well.   When reading Dr. Donivan Scull office note it looks like he offered a low dose beta blocker which she declined.  It there anything else you can offer, or maybe try to get the U/S scheduled sooner?  Please advise.    Thanks,   -Mickel Baas

## 2016-11-29 ENCOUNTER — Other Ambulatory Visit: Payer: Self-pay

## 2016-11-29 ENCOUNTER — Ambulatory Visit (INDEPENDENT_AMBULATORY_CARE_PROVIDER_SITE_OTHER): Payer: Managed Care, Other (non HMO) | Admitting: Family Medicine

## 2016-11-29 ENCOUNTER — Ambulatory Visit (INDEPENDENT_AMBULATORY_CARE_PROVIDER_SITE_OTHER): Payer: Managed Care, Other (non HMO)

## 2016-11-29 ENCOUNTER — Other Ambulatory Visit: Payer: Self-pay | Admitting: Cardiovascular Disease

## 2016-11-29 VITALS — BP 128/88 | HR 92 | Temp 97.6°F | Resp 16 | Wt 127.0 lb

## 2016-11-29 DIAGNOSIS — R61 Generalized hyperhidrosis: Secondary | ICD-10-CM

## 2016-11-29 DIAGNOSIS — R0602 Shortness of breath: Secondary | ICD-10-CM | POA: Diagnosis not present

## 2016-11-29 DIAGNOSIS — E785 Hyperlipidemia, unspecified: Secondary | ICD-10-CM | POA: Diagnosis not present

## 2016-11-29 DIAGNOSIS — R079 Chest pain, unspecified: Secondary | ICD-10-CM

## 2016-11-29 DIAGNOSIS — R5383 Other fatigue: Secondary | ICD-10-CM

## 2016-11-29 DIAGNOSIS — Z1389 Encounter for screening for other disorder: Secondary | ICD-10-CM

## 2016-11-29 DIAGNOSIS — R000 Tachycardia, unspecified: Secondary | ICD-10-CM

## 2016-11-29 DIAGNOSIS — R739 Hyperglycemia, unspecified: Secondary | ICD-10-CM

## 2016-11-29 DIAGNOSIS — Z1331 Encounter for screening for depression: Secondary | ICD-10-CM

## 2016-11-29 DIAGNOSIS — E559 Vitamin D deficiency, unspecified: Secondary | ICD-10-CM

## 2016-11-29 NOTE — Progress Notes (Signed)
Jill Porter  MRN: [id number] DOB: [birth year]  Subjective:  HPI  Patient is here for follow up on fatigue, tachycardia, night sweats. She saw Dr Rockey Situ on 11/24/16 and echocardiogram was ordered. Also it was noted on is note that possible infectious evaluation may be needed. Patient states he mentioned trying Prednisone in case her symptoms are coming from her body trying to fight something off still. On 11/25/16 per message advised patient to start Metoprolol 12.5 mg 1/2 tablet once daily. Last office visit here was on 11/23/16 and labs were ordered CBC, metC, TSH, Sed Rate, T4, calcium ionized-that was ok. PTH was done on 11/15/16 and score was 10.6 better. Patient has been taking Metoprolol and it is helping with heart rate but not other symptoms such as fatigue, also having episodes of feeling warm, ill, sweats that last for about 3 minutes and at that time gets some shortness of breath. She is not sleeping well.  Depression screen PHQ 2/9 11/29/2016  Decreased Interest 2  Down, Depressed, Hopeless 1  PHQ - 2 Score 3  Altered sleeping 3  Tired, decreased energy 3  Change in appetite 0  Feeling bad or failure about yourself  0  Trouble concentrating 1  Moving slowly or fidgety/restless 0  Suicidal thoughts 0  PHQ-9 Score 10  Difficult doing work/chores Extremely dIfficult    Patient Active Problem List   Diagnosis Date Noted  . Special screening for malignant neoplasms, colon   . Perimenopausal vasomotor symptoms 06/10/2015  . Status post hysterectomy 06/10/2015  . History of cervical dysplasia 06/10/2015  . History of oophorectomy, unilateral 06/10/2015  . Cholelithiasis 03/31/2015  . Melanoma ([place]) 03/31/2015  . Hx of abnormal cervical Pap smear 03/31/2015  . Tinea versicolor 03/31/2015  . Vitamin D deficiency 03/31/2015  . Hyperlipemia 03/31/2015  . Depression 03/31/2015  . Acute anxiety 03/31/2015  . Insomnia 03/31/2015  . Hypertension 03/31/2015  . Plantar fasciitis  03/31/2015  . Frequent UTI 03/31/2015  . Discoid eczema 03/31/2015    Past Medical History:  Diagnosis Date  . Cervical dysphagia   . Climacteric   . Hypertension   . Melanoma ([place])   . Motion sickness    anytime moving  . Pelvic adhesions   . Recurrent cystitis     Social History   Social History  . Marital status: Married    Spouse name: N/A  . Number of children: N/A  . Years of education: N/A   Occupational History  . Not on file.   Social History Main Topics  . Smoking status: Never Smoker  . Smokeless tobacco: Never Used  . Alcohol use No  . Drug use: No  . Sexual activity: Yes    Birth control/ protection: Surgical   Other Topics Concern  . Not on file   Social History Narrative  . No narrative on file    Outpatient Encounter Prescriptions as of 11/29/2016  Medication Sig  . hydrochlorothiazide (HYDRODIURIL) 25 MG tablet TAKE 1 TABLET BY MOUTH DAILY  . losartan (COZAAR) 50 MG tablet TAKE 1 TABLET (50 MG TOTAL) BY MOUTH DAILY.  . metoprolol tartrate (LOPRESSOR) 25 MG tablet Take 0.5 tablets (12.5 mg total) by mouth 2 (two) times daily.  Marland Kitchen zolpidem (AMBIEN) 10 MG tablet Take 1 tablet (10 mg total) by mouth at bedtime as needed for sleep.  Marland Kitchen aspirin EC 81 MG tablet Take 81 mg by mouth daily.   No facility-administered encounter medications on file as of 11/29/2016.  Allergies  Allergen Reactions  . Sulfa Antibiotics Hives  . Penicillins Itching and Rash    Review of Systems  Constitutional: Positive for chills and malaise/fatigue.       Sweats  Respiratory: Positive for shortness of breath (off and on).   Cardiovascular: Negative.        Some chest tightness  Gastrointestinal: Negative.   Musculoskeletal: Positive for joint pain (elbows) and neck pain.  Neurological: Positive for weakness and headaches. Negative for tremors, speech change and focal weakness.  Psychiatric/Behavioral: The patient has insomnia.        Feels "ill" due to not  feeling well    Objective:  BP 128/88   Pulse 92   Temp 97.6 F (36.4 C)   Resp 16   Wt 127 lb (57.6 kg)   BMI 21.80 kg/m   Physical Exam  Constitutional: She is oriented to person, place, and time and well-developed, well-nourished, and in no distress. Vital signs are normal. She has a sickly appearance.  HENT:  Head: Normocephalic and atraumatic.  Right Ear: External ear normal.  Left Ear: External ear normal.  Nose: Nose normal.  Eyes: Conjunctivae are normal. Pupils are equal, round, and reactive to light.  Neck: Normal range of motion. Neck supple. No thyromegaly present.  Cardiovascular: Normal rate, regular rhythm, normal heart sounds and intact distal pulses.   No murmur heard. Pulmonary/Chest: Effort normal and breath sounds normal. No respiratory distress. She has no wheezes.  Abdominal: Soft.  Neurological: She is alert and oriented to person, place, and time. Gait normal. GCS score is 15.  Skin: Skin is warm and dry.  Psychiatric: Mood, memory, affect and judgment normal.   Assessment and Plan :  1. Tachycardia Better since starting  Metoprolol. Continue this medication at 1/2 tablet once daily. - Protein electrophoresis, serum - Ambulatory referral to Infectious Disease - Catecholamine+VMA, 24-Hr Urine  2. Fatigue, unspecified type Will check more labs. Refer to Dr David Stall for further work up. Will speak with him also if it is ok to start patient on Prednisone. - Protein electrophoresis, serum - Ambulatory referral to Infectious Disease - Catecholamine+VMA, 24-Hr Urine - CT Chest Wo Contrast; Future - CT ABDOMEN W CONTRAST; Future - CT PELVIS W CONTRAST; Future - Protein electrophoresis, urine  3. Unexplained night sweats See plan above. R/o Pheochromocytoma although unlikely. - Protein electrophoresis, serum - Ambulatory referral to Infectious Disease - Catecholamine+VMA, 24-Hr Urine - CT Chest Wo Contrast; Future - CT ABDOMEN W CONTRAST;  Future - CT PELVIS W CONTRAST; Future - Protein electrophoresis, urine  4. Positive depression screening PHQ9 score is 10 today. I think this is due to patient not feeling well. Will re check on the next visit.  5. Hypercalcemia Order tests as above, pending results.CT of chest /Abd/Pelvis - Ambulatory referral to Infectious Disease - CT Chest Wo Contrast; Future - CT ABDOMEN W CONTRAST; Future - CT PELVIS W CONTRAST; Future  6. Hyperlipidemia, unspecified hyperlipidemia type Time tore check this level. - Lipid Panel With LDL/HDL Ratio  7. Hyperglycemia - HgB A1c  8. Vitamin D deficiency Borderline level in 2016. Re check today. - Vitamin D (25 hydroxy)  HPI, Exam and A&P transcribed under direction and in the presence of Miguel Aschoff, MD. I have done the exam and reviewed the chart and it is accurate to the best of my knowledge. Development worker, community has been used and  any errors in dictation or transcription are unintentional. Miguel Aschoff M.D. [facility]  [facility]  

## 2016-11-30 ENCOUNTER — Ambulatory Visit: Payer: Managed Care, Other (non HMO) | Admitting: Family Medicine

## 2016-11-30 LAB — ECHOCARDIOGRAM COMPLETE: Weight: 2032 oz

## 2016-12-02 LAB — LIPID PANEL WITH LDL/HDL RATIO
Cholesterol, Total: 238 mg/dL — ABNORMAL HIGH (ref 100–199)
HDL: 56 mg/dL (ref >39)
LDL Calculated: 155 mg/dL — ABNORMAL HIGH (ref 0–99)
LDl/HDL Ratio: 2.8 ratio units (ref 0.0–3.2)
Triglycerides: 133 mg/dL (ref 0–149)
VLDL Cholesterol Cal: 27 mg/dL (ref 5–40)

## 2016-12-02 LAB — PROTEIN ELECTROPHORESIS, SERUM
A/G Ratio: 1.2 (ref 0.7–1.7)
Albumin ELP: 4.1 g/dL (ref 2.9–4.4)
Alpha 1: 0.2 g/dL (ref 0.0–0.4)
Alpha 2: 0.8 g/dL (ref 0.4–1.0)
Beta: 1.2 g/dL (ref 0.7–1.3)
Gamma Globulin: 1.2 g/dL (ref 0.4–1.8)
Globulin, Total: 3.5 g/dL (ref 2.2–3.9)
Total Protein: 7.6 g/dL (ref 6.0–8.5)

## 2016-12-02 LAB — HEMOGLOBIN A1C
Est. average glucose Bld gHb Est-mCnc: 105 mg/dL
Hgb A1c MFr Bld: 5.3 % (ref 4.8–5.6)

## 2016-12-02 LAB — VITAMIN D 25 HYDROXY (VIT D DEFICIENCY, FRACTURES): Vit D, 25-Hydroxy: 35.5 ng/mL (ref 30.0–100.0)

## 2016-12-07 ENCOUNTER — Ambulatory Visit: Payer: Self-pay | Admitting: Family Medicine

## 2016-12-07 LAB — SPECIMEN STATUS REPORT

## 2016-12-07 LAB — CA+CREAT+P+PTH INTACT
Calcium: 10.8 mg/dL — ABNORMAL HIGH (ref 8.7–10.2)
Creatinine, Ser: 1.03 mg/dL — ABNORMAL HIGH (ref 0.57–1.00)
GFR calc Af Amer: 73 mL/min/{1.73_m2} (ref >59)
GFR calc non Af Amer: 63 mL/min/{1.73_m2} (ref >59)
Phosphorus: 4.1 mg/dL (ref 2.5–4.5)

## 2016-12-08 ENCOUNTER — Ambulatory Visit
Admission: RE | Admit: 2016-12-08 | Discharge: 2016-12-08 | Disposition: A | Discharge status: Home / Self Care | Payer: Managed Care, Other (non HMO) | Source: Ambulatory Visit | Attending: Family Medicine | Admitting: Family Medicine

## 2016-12-08 DIAGNOSIS — Z90710 Acquired absence of both cervix and uterus: Secondary | ICD-10-CM | POA: Diagnosis not present

## 2016-12-08 DIAGNOSIS — R61 Generalized hyperhidrosis: Secondary | ICD-10-CM | POA: Insufficient documentation

## 2016-12-08 DIAGNOSIS — D7389 Other diseases of spleen: Secondary | ICD-10-CM | POA: Insufficient documentation

## 2016-12-08 DIAGNOSIS — R5383 Other fatigue: Secondary | ICD-10-CM | POA: Diagnosis present

## 2016-12-08 DIAGNOSIS — Z9049 Acquired absence of other specified parts of digestive tract: Secondary | ICD-10-CM | POA: Diagnosis not present

## 2016-12-08 LAB — UIFE/LIGHT CHAINS/TP QN, 24-HR UR
FR KAPPA LT CH,24HR: 25 mg/24 hr
FR LAMBDA LT CH,24HR: 3 mg/24 hr
Free Kappa Lt Chains,Ur: 7.43 mg/L (ref 1.35–24.19)
Free Lambda Lt Chains,Ur: 0.83 mg/L (ref 0.24–6.66)
Kappa/Lambda Ratio,U: 8.95 (ref 2.04–10.37)
Protein, 24H Urine: <136 mg/24 hr (ref 30–150)
Protein, Ur: <4 mg/dL

## 2016-12-08 LAB — CATECHOLAMINE+VMA, 24-HR URINE
Dopamine , 24H Ur: 449 ug/24 hr (ref 0–510)
Dopamine, Rand Ur: 132 ug/L
Epinephrine, 24H Ur: 7 ug/24 hr (ref 0–20)
Epinephrine, Rand Ur: 2 ug/L
Norepinephrine, 24H Ur: 156 ug/24 hr — ABNORMAL HIGH (ref 0–135)
Norepinephrine, Rand Ur: 46 ug/L
VMA, 24H Ur Adult: 7.8 mg/24 hr — ABNORMAL HIGH (ref 0.0–7.5)
VMA, Urine: 2.3 mg/L

## 2016-12-08 MED ORDER — IOPAMIDOL (ISOVUE-300) INJECTION 61%
100.0000 mL | Freq: Once | INTRAVENOUS | Status: AC | PRN
  Administered 2016-12-08: 100 mL via INTRAVENOUS

## 2016-12-14 ENCOUNTER — Ambulatory Visit (INDEPENDENT_AMBULATORY_CARE_PROVIDER_SITE_OTHER): Payer: Managed Care, Other (non HMO) | Admitting: Family Medicine

## 2016-12-14 VITALS — BP 102/80 | HR 80 | Temp 98.4°F | Resp 14 | Wt 128.0 lb

## 2016-12-14 DIAGNOSIS — Z1389 Encounter for screening for other disorder: Secondary | ICD-10-CM | POA: Diagnosis not present

## 2016-12-14 DIAGNOSIS — Z1331 Encounter for screening for depression: Secondary | ICD-10-CM

## 2016-12-14 DIAGNOSIS — R000 Tachycardia, unspecified: Secondary | ICD-10-CM | POA: Diagnosis not present

## 2016-12-14 DIAGNOSIS — M791 Myalgia, unspecified site: Secondary | ICD-10-CM

## 2016-12-14 DIAGNOSIS — R5383 Other fatigue: Secondary | ICD-10-CM

## 2016-12-14 DIAGNOSIS — R825 Elevated urine levels of drugs, medicaments and biological substances: Secondary | ICD-10-CM

## 2016-12-14 MED ORDER — COQ10 30 MG PO CAPS: ORAL_CAPSULE | ORAL | 0 refills | Status: DC

## 2016-12-14 MED ORDER — NAPROXEN 500 MG PO TABS: 500.0000 mg | ORAL_TABLET | Freq: Two times a day (BID) | ORAL | 5 refills | Status: DC

## 2016-12-14 NOTE — Progress Notes (Signed)
Jill Porter  MRN: [id number] DOB: October 23, 1965  Subjective:  HPI  Patient is here for 2 week follow up. LOV was 11/29/16. At that time discussed tachycardia, fatigue, night sweats, hypercalcemia. CT scan was ordered of the chest, abdomen and pelvis-results showed benign spots in the spleen, no evidence of cancer, repeat in 6 months. Labs were ordered but not reviewed. Patient was referred to Dr Ola Spurr. PHQ9 was done and score was 10. Patient is still having all the symptoms fatigue, sweats, hot flashes episodes, palpitations. Hot flashes are not as intense all the time like it was. Patient saw Dr Ola Spurr on 12/09/16 and he ordered more labs which she has not heard from. He referred patient to endocrinologist for hypercalcemia. No new medications have been started. Depression screen [facility] 2/9 12/14/2016 11/29/2016  Decreased Interest 2 2  Down, Depressed, Hopeless 3 1  PHQ - 2 Score 5 3  Altered sleeping 2 3  Tired, decreased energy 3 3  Change in appetite 0 0  Feeling bad or failure about yourself  0 0  Trouble concentrating 1 1  Moving slowly or fidgety/restless 0 0  Suicidal thoughts 0 0  PHQ-9 Score 11 10  Difficult doing work/chores Somewhat difficult Extremely dIfficult   Patient Active Problem List   Diagnosis Date Noted  . Special screening for malignant neoplasms, colon   . Perimenopausal vasomotor symptoms 06/10/2015  . Status post hysterectomy 06/10/2015  . History of cervical dysplasia 06/10/2015  . History of oophorectomy, unilateral 06/10/2015  . Cholelithiasis 03/31/2015  . Melanoma ([place]) 03/31/2015  . Hx of abnormal cervical Pap smear 03/31/2015  . Tinea versicolor 03/31/2015  . Vitamin D deficiency 03/31/2015  . Hyperlipemia 03/31/2015  . Depression 03/31/2015  . Acute anxiety 03/31/2015  . Insomnia 03/31/2015  . Hypertension 03/31/2015  . Plantar fasciitis 03/31/2015  . Frequent UTI 03/31/2015  . Discoid eczema 03/31/2015    Past Medical History:    Diagnosis Date  . Cervical dysphagia   . Climacteric   . Hypertension   . Melanoma ([place])   . Motion sickness    anytime moving  . Pelvic adhesions   . Recurrent cystitis     Social History   Social History  . Marital status: Married    Spouse name: N/A  . Number of children: N/A  . Years of education: N/A   Occupational History  . Not on file.   Social History Main Topics  . Smoking status: Never Smoker  . Smokeless tobacco: Never Used  . Alcohol use No  . Drug use: No  . Sexual activity: Yes    Birth control/ protection: Surgical   Other Topics Concern  . Not on file   Social History Narrative  . No narrative on file    Outpatient Encounter Prescriptions as of 12/14/2016  Medication Sig  . aspirin EC 81 MG tablet Take 81 mg by mouth daily.  . hydrochlorothiazide (HYDRODIURIL) 25 MG tablet TAKE 1 TABLET BY MOUTH DAILY  . losartan (COZAAR) 50 MG tablet TAKE 1 TABLET (50 MG TOTAL) BY MOUTH DAILY.  . metoprolol tartrate (LOPRESSOR) 25 MG tablet Take 0.5 tablets (12.5 mg total) by mouth 2 (two) times daily.  Marland Kitchen zolpidem (AMBIEN) 10 MG tablet Take 1 tablet (10 mg total) by mouth at bedtime as needed for sleep.   No facility-administered encounter medications on file as of 12/14/2016.     Allergies  Allergen Reactions  . Sulfa Antibiotics Hives  . Penicillins Itching and Rash  Review of Systems  Constitutional: Positive for malaise/fatigue.  Eyes: Negative.   Respiratory: Negative.   Cardiovascular: Positive for palpitations.  Gastrointestinal: Negative.   Musculoskeletal: Positive for joint pain (elbow pain, arm pain, in the back of shoulder.).  Neurological:       Trouble concentrating sometimes  Endo/Heme/Allergies: Negative.   Psychiatric/Behavioral: The patient has insomnia.     Objective:  BP 102/80   Pulse 80   Temp 98.4 F (36.9 C)   Resp 14   Wt 128 lb (58.1 kg)   BMI 21.97 kg/m   Physical Exam  Constitutional: She is oriented to  person, place, and time and well-developed, well-nourished, and in no distress.  HENT:  Head: Normocephalic and atraumatic.  Right Ear: External ear normal.  Left Ear: External ear normal.  Nose: Nose normal.  Eyes: Conjunctivae are normal. Pupils are equal, round, and reactive to light.  Neck: Normal range of motion. Neck supple. No tracheal deviation present. No thyromegaly present.  Cardiovascular: Normal rate, regular rhythm, normal heart sounds and intact distal pulses.   No murmur heard. Pulmonary/Chest: Effort normal and breath sounds normal. No respiratory distress. She has no wheezes.  Abdominal: Soft. Bowel sounds are normal.  Neurological: She is alert and oriented to person, place, and time.  Skin: Skin is warm and dry.  Had episode in office where she has flushing and neck becomes red.   Assessment and Plan :  1. Tachycardia Discussed lab results and CT scans that were done. Discussed everything in detail with the patient. Advised patient to make sure and see endocrinologist. Cardiac work up has been ok.Tolerating Metoprolol.  2. Hypercalcemia Hyperparathyroid? CT scan negative fro malignancy.Will try  3. Positive depression screening PHQ 9 score 11 today. I think this is all related to patient not feeling well and trying to figure out the etiology for her symptoms.  4. Other fatigue 5. Raised urine VMA--r/o Pheochromocytoma although adrenals normal on CT 6. Myalgia May be coming from calcium issue. If all work up is ok with endocrinologist will re address myalgia issue, may need further work up for this at that time. Try Coq10 and Naproxen.  HPI, Exam and A&P transcribed under direction and in the presence of Miguel Aschoff, MD. I have done the exam and reviewed the chart and it is accurate to the best of my knowledge. Development worker, community has been used and  any errors in dictation or transcription are unintentional. Miguel Aschoff M.D. [facility]

## 2016-12-14 NOTE — Patient Instructions (Signed)
Try OTC Co Q10 1 tablet daily. And start naproxen.

## 2016-12-20 ENCOUNTER — Other Ambulatory Visit: Payer: Managed Care, Other (non HMO)

## 2016-12-26 ENCOUNTER — Other Ambulatory Visit: Payer: Self-pay | Admitting: Family Medicine

## 2016-12-27 MED ORDER — ZOLPIDEM TARTRATE 10 MG PO TABS: 10.0000 mg | ORAL_TABLET | Freq: Every evening | ORAL | 5 refills | Status: DC | PRN

## 2017-01-10 ENCOUNTER — Telehealth: Payer: Self-pay

## 2017-01-10 ENCOUNTER — Ambulatory Visit (INDEPENDENT_AMBULATORY_CARE_PROVIDER_SITE_OTHER): Payer: Managed Care, Other (non HMO) | Admitting: Family Medicine

## 2017-01-10 DIAGNOSIS — R61 Generalized hyperhidrosis: Secondary | ICD-10-CM

## 2017-01-10 DIAGNOSIS — R5383 Other fatigue: Secondary | ICD-10-CM

## 2017-01-10 DIAGNOSIS — Z90721 Acquired absence of ovaries, unilateral: Secondary | ICD-10-CM | POA: Diagnosis not present

## 2017-01-10 DIAGNOSIS — R000 Tachycardia, unspecified: Secondary | ICD-10-CM | POA: Diagnosis not present

## 2017-01-10 DIAGNOSIS — E041 Nontoxic single thyroid nodule: Secondary | ICD-10-CM

## 2017-01-10 DIAGNOSIS — Z90710 Acquired absence of both cervix and uterus: Secondary | ICD-10-CM

## 2017-01-10 MED ORDER — ESTRADIOL 1 MG PO TABS: 1.0000 mg | ORAL_TABLET | Freq: Every day | ORAL | 5 refills | Status: DC

## 2017-01-10 NOTE — Telephone Encounter (Signed)
Patient needs referral for second opinion to Endocrinologist in [place] and also see Dr Ella Bodo in [facility] endocrinology surgeon. I put order in for endocrinology and could not find one to pull down for endocrinology surgeon.  Please have this done asap please. Patient has had symptoms issues for 3 months now.-aa

## 2017-01-10 NOTE — Progress Notes (Signed)
Jill Porter  MRN: [id number] DOB: 29-Aug-1966  Subjective:  HPI  Patient is here for follow up. Patient was last seen by [place] on 12/14/16. She has seen Jill Porter since that visit x 2. She had additional lab work done and US of the neck. Office notes and messages are on file. Patient did not have a pleasant visit with Jill Porter and has requested second opinion and has been referred to Jill Porter but she is not sure if she wants to go that Porter.  Patient is still having all her issues/symptoms.She is mainly having the hot flashes,sweats and her neck turns red. Did not start until recent illness.  Patient Active Problem List   Diagnosis Date Noted  . Special screening for malignant neoplasms, colon   . Perimenopausal vasomotor symptoms 06/10/2015  . Status post hysterectomy 06/10/2015  . History of cervical dysplasia 06/10/2015  . History of oophorectomy, unilateral 06/10/2015  . Cholelithiasis 03/31/2015  . Melanoma ([place]) 03/31/2015  . Hx of abnormal cervical Pap smear 03/31/2015  . Tinea versicolor 03/31/2015  . Vitamin D deficiency 03/31/2015  . Hyperlipemia 03/31/2015  . Depression 03/31/2015  . Acute anxiety 03/31/2015  . Insomnia 03/31/2015  . Hypertension 03/31/2015  . Plantar fasciitis 03/31/2015  . Frequent UTI 03/31/2015  . Discoid eczema 03/31/2015    Past Medical History:  Diagnosis Date  . Cervical dysphagia   . Climacteric   . Hypertension   . Melanoma ([place])   . Motion sickness    anytime moving  . Pelvic adhesions   . Recurrent cystitis     Social History   Social History  . Marital status: Married    Spouse name: N/A  . Number of children: N/A  . Years of education: N/A   Occupational History  . Not on file.   Social History Main Topics  . Smoking status: Never Smoker  . Smokeless tobacco: Never Used  . Alcohol use No  . Drug use: No  . Sexual activity: Yes    Birth control/ protection: Surgical   Other Topics Concern  .  Not on file   Social History Narrative  . No narrative on file    Outpatient Encounter Prescriptions as of 01/10/2017  Medication Sig  . aspirin EC 81 MG tablet Take 81 mg by mouth daily.  . Coenzyme Q10 (COQ10) 30 MG CAPS 1 tablet daily OTC  . hydrochlorothiazide (HYDRODIURIL) 25 MG tablet TAKE 1 TABLET BY MOUTH DAILY  . losartan (COZAAR) 50 MG tablet TAKE 1 TABLET (50 MG TOTAL) BY MOUTH DAILY.  . metoprolol tartrate (LOPRESSOR) 25 MG tablet Take 0.5 tablets (12.5 mg total) by mouth 2 (two) times daily.  . naproxen (NAPROSYN) 500 MG tablet Take 1 tablet (500 mg total) by mouth 2 (two) times daily with a meal.  . zolpidem (AMBIEN) 10 MG tablet Take 1 tablet (10 mg total) by mouth at bedtime as needed for sleep.   No facility-administered encounter medications on file as of 01/10/2017.     Allergies  Allergen Reactions  . Sulfa Antibiotics Hives  . Penicillins Itching and Rash    Review of Systems  Constitutional: Positive for malaise/fatigue.       Sweats.  Respiratory: Negative.   Cardiovascular: Positive for palpitations.  Musculoskeletal: Negative.     Objective:  BP 128/82 (BP Location: Left Arm)   Pulse 98   Temp 97.9 F (36.6 C)   Resp 16   Wt 127 lb (57.6 kg)  BMI 21.80 kg/m   Physical Exam  Constitutional: She is oriented to person, place, and time and well-developed, well-nourished, and in no distress.  HENT:  Head: Normocephalic and atraumatic.  Eyes: Conjunctivae are normal. Pupils are equal, round, and reactive to light.  Neck: Normal range of motion. Neck supple. No thyromegaly present.  Cardiovascular: Normal rate, regular rhythm, normal heart sounds and intact distal pulses.   No murmur heard. Pulmonary/Chest: Effort normal and breath sounds normal. No respiratory distress. She has no wheezes.  Musculoskeletal: She exhibits no edema or tenderness.  Lymphadenopathy:    She has no cervical adenopathy.  Neurological: She is alert and oriented to  person, place, and time.  Psychiatric: Mood, memory, affect and judgment normal.  towards the end of the visit patient started to have an episode of flushness, sweating, neck turned red-lasted for a few minutes.  Assessment and Plan :  1. Hypercalcemia Will refer patient for second opinion to an endocrinologist in [facility] and endocrinology surgeon Jill Porter. Discussed with patient all the results and notes from all the specialists she has seen for her symptoms. - Ambulatory referral to Endocrinology Pt had negative CT of chest/abd/pelvis.  2. Unexplained night sweats Discussed with patient in details that maybe some of these symptoms are coming from menopause. Discussed trying hormonal treatment. RX for Estradiol given to the patient to try for 2 weeks and see if this helps and if this does not work for patient advised her she can stop.  Ambulatory referral to Endocrinology  3. Thyroid nodule - Ambulatory referral to Endocrinology  4. Tachycardia  5. Other fatigue Improved. HPI, Exam and A&P transcribed under direction and in the presence of Jill Aschoff, MD. I have done the exam and reviewed the chart and it is accurate to the best of my knowledge. Development worker, community has been used and  any errors in dictation or transcription are unintentional. Jill Porter M.D. [facility]

## 2017-01-10 NOTE — Telephone Encounter (Signed)
Dr Harlow Asa for surgeon is the right spelling. Let me know if you have a question about this-aa

## 2017-01-17 ENCOUNTER — Telehealth: Payer: Self-pay | Admitting: Family Medicine

## 2017-01-17 NOTE — Telephone Encounter (Signed)
Appointment with Dr Harlow Asa made for 02/08/17 at 9:00

## 2017-01-17 NOTE — Telephone Encounter (Signed)
Information faxed to Dr Harlow Asa.His office will contact pt

## 2017-01-27 ENCOUNTER — Ambulatory Visit: Payer: Managed Care, Other (non HMO) | Admitting: Endocrinology

## 2017-02-08 ENCOUNTER — Ambulatory Visit: Payer: Managed Care, Other (non HMO) | Admitting: Family Medicine

## 2017-02-15 ENCOUNTER — Other Ambulatory Visit: Payer: Self-pay | Admitting: Family Medicine

## 2017-04-14 HISTORY — PX: PARATHYROIDECTOMY: SHX19

## 2017-04-19 ENCOUNTER — Encounter: Payer: Self-pay | Admitting: Physician Assistant

## 2017-04-19 ENCOUNTER — Ambulatory Visit (INDEPENDENT_AMBULATORY_CARE_PROVIDER_SITE_OTHER): Payer: Managed Care, Other (non HMO) | Admitting: Physician Assistant

## 2017-04-19 VITALS — BP 132/87 | HR 108 | Temp 98.1°F | Resp 16 | Wt 129.0 lb

## 2017-04-19 DIAGNOSIS — R110 Nausea: Secondary | ICD-10-CM

## 2017-04-19 DIAGNOSIS — R300 Dysuria: Secondary | ICD-10-CM

## 2017-04-19 DIAGNOSIS — N12 Tubulo-interstitial nephritis, not specified as acute or chronic: Secondary | ICD-10-CM

## 2017-04-19 LAB — POCT URINALYSIS DIPSTICK
Bilirubin, UA: NEGATIVE
Glucose, UA: NEGATIVE
Ketones, UA: NEGATIVE
Nitrite, UA: NEGATIVE
Spec Grav, UA: 1.015 (ref 1.010–1.025)
Urobilinogen, UA: 0.2 E.U./dL
pH, UA: 7 (ref 5.0–8.0)

## 2017-04-19 MED ORDER — CIPROFLOXACIN HCL 500 MG PO TABS: 500.0000 mg | ORAL_TABLET | Freq: Two times a day (BID) | ORAL | 0 refills | Status: AC

## 2017-04-19 MED ORDER — ONDANSETRON HCL 4 MG PO TABS: 4.0000 mg | ORAL_TABLET | Freq: Three times a day (TID) | ORAL | 0 refills | Status: DC | PRN

## 2017-04-19 MED ORDER — PHENAZOPYRIDINE HCL 100 MG PO TABS: 100.0000 mg | ORAL_TABLET | Freq: Three times a day (TID) | ORAL | 0 refills | Status: DC | PRN

## 2017-04-19 NOTE — Patient Instructions (Signed)
Pyelonephritis, Adult Pyelonephritis is a kidney infection. The kidneys are the organs that filter a person's blood and move waste out of the bloodstream and into the urine. Urine passes from the kidneys, through the ureters, and into the bladder. There are two main types of pyelonephritis:  Infections that come on quickly without any warning (acute pyelonephritis).  Infections that last for a long period of time (chronic pyelonephritis).  In most cases, the infection clears up with treatment and does not cause further problems. More severe infections or chronic infections can sometimes spread to the bloodstream or lead to other problems with the kidneys. What are the causes? This condition is usually caused by:  Bacteria traveling from the bladder to the kidney through infected urine. The urine in the bladder can become infected with bacteria from: ? Bladder infection (cystitis). ? Inflammation of the prostate gland (prostatitis). ? Sexual intercourse, in females.  Bacteria traveling from the bloodstream to the kidney.  What increases the risk? This condition is more likely to develop in:  Pregnant women.  Older people.  People who have diabetes.  People who have kidney stones or bladder stones.  People who have other abnormalities of the kidney or ureter.  People who have a catheter placed in the bladder.  People who have cancer.  People who are sexually active.  Women who use spermicides.  People who have had a prior urinary tract infection.  What are the signs or symptoms? Symptoms of this condition include:  Frequent urination.  Strong or persistent urge to urinate.  Burning or stinging when urinating.  Abdominal pain.  Back pain.  Pain in the side or flank area.  Fever.  Chills.  Blood in the urine, or dark urine.  Nausea.  Vomiting.  How is this diagnosed? This condition may be diagnosed based on:  Medical history and physical exam.  Urine  tests.  Blood tests.  You may also have imaging tests of the kidneys, such as an ultrasound or CT scan. How is this treated? Treatment for this condition may depend on the severity of the infection.  If the infection is mild and is found early, you may be treated with antibiotic medicines taken by mouth. You will need to drink fluids to remain hydrated.  If the infection is more severe, you may need to stay in the hospital and receive antibiotics given directly into a vein through an IV tube. You may also need to receive fluids through an IV tube if you are not able to remain hydrated. After your hospital stay, you may need to take oral antibiotics for a period of time.  Other treatments may be required, depending on the cause of the infection. Follow these instructions at home: Medicines  Take over-the-counter and prescription medicines only as told by your health care provider.  If you were prescribed an antibiotic medicine, take it as told by your health care provider. Do not stop taking the antibiotic even if you start to feel better. General instructions  Drink enough fluid to keep your urine clear or pale yellow.  Avoid caffeine, tea, and carbonated beverages. They tend to irritate the bladder.  Urinate often. Avoid holding in urine for long periods of time.  Urinate before and after sex.  After a bowel movement, women should cleanse from front to back. Use each tissue only once.  Keep all follow-up visits as told by your health care provider. This is important. Contact a health care provider if:  Your symptoms   do not get better after 2 days of treatment.  Your symptoms get worse.  You have a fever. Get help right away if:  You are unable to take your antibiotics or fluids.  You have shaking chills.  You vomit.  You have severe flank or back pain.  You have extreme weakness or fainting. This information is not intended to replace advice given to you by your  health care provider. Make sure you discuss any questions you have with your health care provider. Document Released: 09/19/2005 Document Revised: 02/25/2016 Document Reviewed: 01/12/2015 Elsevier Interactive Patient Education  2018 Elsevier Inc.  

## 2017-04-19 NOTE — Progress Notes (Signed)
[place]  Chief Complaint  Patient presents with  . Urinary Tract Infection    Started Monday    Subjective:    Patient ID: Jill Porter, female    DOB: [birth year], 51 y.o.   MRN: [id number]   Urinary Tract Infection: Patient is a 51 y/o woman with recent parathyroidectomy on 04/14/2017 who complains of burning with urination and frequency She has had symptoms for 2 days. Patient also complains of back pain. She is feeling chilly but no fevers. Patient denies stomach ache and vaginal discharge. Patient does have a history of recurrent UTI.  Patient does not have a history of pyelonephritis or other renal issues. Patient denies vaginal discharge and denies new sexual partners. The patient denies recent travel outside of the [place].  Review of Systems  Constitutional: Positive for chills and malaise/fatigue. Negative for diaphoresis, fever and weight loss.  Gastrointestinal: Negative.  Negative for melena.  Genitourinary: Positive for dysuria, frequency and urgency. Negative for flank pain and hematuria.  Musculoskeletal: Positive for back pain.  Skin: Positive for rash.  Neurological: Negative for dizziness, weakness and headaches.       Objective:   BP 132/87 (BP Location: Left Arm, Patient Position: Sitting, Cuff Size: Normal)   Pulse (!) 108   Temp 98.1 F (36.7 C) (Oral)   Resp 16   Wt 129 lb (58.5 kg)   SpO2 97%   BMI 22.14 kg/m   Patient Active Problem List   Diagnosis Date Noted  . Special screening for malignant neoplasms, colon   . Perimenopausal vasomotor symptoms 06/10/2015  . Status post hysterectomy 06/10/2015  . History of cervical dysplasia 06/10/2015  . History of oophorectomy, unilateral 06/10/2015  . Cholelithiasis 03/31/2015  . Melanoma ([place]) 03/31/2015  . Hx of abnormal cervical Pap smear 03/31/2015  . Tinea versicolor 03/31/2015  . Vitamin D deficiency 03/31/2015  . Hyperlipemia 03/31/2015  .  Depression 03/31/2015  . Acute anxiety 03/31/2015  . Insomnia 03/31/2015  . Hypertension 03/31/2015  . Plantar fasciitis 03/31/2015  . Frequent UTI 03/31/2015  . Discoid eczema 03/31/2015    Outpatient Encounter Prescriptions as of 04/19/2017  Medication Sig  . estradiol (ESTRACE) 1 MG tablet Take 1 tablet (1 mg total) by mouth daily.  . hydrochlorothiazide (HYDRODIURIL) 25 MG tablet TAKE 1 TABLET BY MOUTH DAILY  . losartan (COZAAR) 50 MG tablet TAKE 1 TABLET (50 MG TOTAL) BY MOUTH DAILY.  . metoprolol tartrate (LOPRESSOR) 25 MG tablet Take 0.5 tablets (12.5 mg total) by mouth 2 (two) times daily.  . naproxen (NAPROSYN) 500 MG tablet Take 1 tablet (500 mg total) by mouth 2 (two) times daily with a meal.  . zolpidem (AMBIEN) 10 MG tablet Take 1 tablet (10 mg total) by mouth at bedtime as needed for sleep.  . [DISCONTINUED] Coenzyme Q10 (COQ10) 30 MG CAPS 1 tablet daily OTC  . aspirin EC 81 MG tablet Take 81 mg by mouth daily.  . ciprofloxacin (CIPRO) 500 MG tablet Take 1 tablet (500 mg total) by mouth 2 (two) times daily.  . ondansetron (ZOFRAN) 4 MG tablet Take 1 tablet (4 mg total) by mouth every 8 (eight) hours as needed for nausea or vomiting.  . phenazopyridine (PYRIDIUM) 100 MG tablet Take 1 tablet (100 mg total) by mouth 3 (three) times daily as needed for pain.   No facility-administered encounter medications on file as of 04/19/2017.     Allergies  Allergen Reactions  . Sulfa Antibiotics Hives  .  Penicillins Itching and Rash       Physical Exam  Constitutional: She is oriented to person, place, and time. She appears well-developed and well-nourished. No distress.  Cardiovascular: Normal rate and regular rhythm.   Pulmonary/Chest: Effort normal and breath sounds normal.  Abdominal: Soft. Bowel sounds are normal. She exhibits no distension. There is tenderness in the suprapubic area. There is CVA tenderness. There is no rebound and no guarding.  Neurological: She is alert  and oriented to person, place, and time.  Skin: Skin is warm and dry. She is not diaphoretic.  Psychiatric: She has a normal mood and affect. Her behavior is normal.       Assessment & Plan:   1. Pyelonephritis  Treat as below. Pyridium x 2 days only. Return precautions counseled.   - ciprofloxacin (CIPRO) 500 MG tablet; Take 1 tablet (500 mg total) by mouth 2 (two) times daily.  Dispense: 14 tablet; Refill: 0 - phenazopyridine (PYRIDIUM) 100 MG tablet; Take 1 tablet (100 mg total) by mouth 3 (three) times daily as needed for pain.  Dispense: 10 tablet; Refill: 0  2. Nausea  - ondansetron (ZOFRAN) 4 MG tablet; Take 1 tablet (4 mg total) by mouth every 8 (eight) hours as needed for nausea or vomiting.  Dispense: 20 tablet; Refill: 0  3. Dysuria  - POCT urinalysis dipstick - Urine Culture  Return if symptoms worsen or fail to improve.  The entirety of the information documented in the History of Present Illness, Review of Systems and Physical Exam were personally obtained by me. Portions of this information were initially documented by Ashley Royalty, CMA and reviewed by me for thoroughness and accuracy.

## 2017-04-21 ENCOUNTER — Other Ambulatory Visit: Payer: Self-pay | Admitting: Family Medicine

## 2017-04-21 DIAGNOSIS — I10 Essential (primary) hypertension: Secondary | ICD-10-CM

## 2017-04-23 LAB — PLEASE NOTE

## 2017-04-23 LAB — URINE CULTURE

## 2017-04-24 ENCOUNTER — Telehealth: Payer: Self-pay

## 2017-04-24 NOTE — Telephone Encounter (Signed)
-----   Message from [name], [place] sent at 04/24/2017  1:06 PM EDT ----- Urine culture grew E. Coli sensitive to ciprofloxacin. Is patient feeling better?

## 2017-04-24 NOTE — Telephone Encounter (Signed)
LMTCB

## 2017-04-24 NOTE — Telephone Encounter (Signed)
Patient advised. She states she still has some discomfort, but she has one day left of antibiotic. Advised patient to call back if symptoms remain after completing antibiotics.

## 2017-04-27 ENCOUNTER — Telehealth: Payer: Self-pay | Admitting: Family Medicine

## 2017-04-27 DIAGNOSIS — N12 Tubulo-interstitial nephritis, not specified as acute or chronic: Secondary | ICD-10-CM

## 2017-04-27 MED ORDER — CIPROFLOXACIN HCL 500 MG PO TABS: 500.0000 mg | ORAL_TABLET | Freq: Two times a day (BID) | ORAL | 0 refills | Status: AC

## 2017-04-27 NOTE — Telephone Encounter (Signed)
I sent in another week of cipro 500 mg twice daily for this patient as her culture was sensitive to this and she is still symptomatic. Please have her complete this. If at any point she develops fever, severe back pain, nausea, vomiting, she should be seen in ER as this would be failure of outpatient treatment for UTI.

## 2017-04-27 NOTE — Telephone Encounter (Signed)
Patient advised.

## 2017-04-27 NOTE — Telephone Encounter (Signed)
Pt states she seen Adriana 04/19/17 for burning and frequency when voiding.  Pt states she has finished the Rx and still having burning and frequency when voiding, headache and chills.  [facility].  HC#[phone]

## 2017-05-12 ENCOUNTER — Ambulatory Visit (INDEPENDENT_AMBULATORY_CARE_PROVIDER_SITE_OTHER): Payer: Managed Care, Other (non HMO) | Admitting: Physician Assistant

## 2017-05-12 ENCOUNTER — Encounter: Payer: Self-pay | Admitting: Physician Assistant

## 2017-05-12 VITALS — BP 144/92 | HR 88 | Temp 97.9°F | Resp 16 | Wt 130.0 lb

## 2017-05-12 DIAGNOSIS — N3090 Cystitis, unspecified without hematuria: Secondary | ICD-10-CM

## 2017-05-12 LAB — POCT URINALYSIS DIPSTICK
Bilirubin, UA: NEGATIVE
Glucose, UA: NEGATIVE
Ketones, UA: NEGATIVE
Leukocytes, UA: NEGATIVE
Nitrite, UA: NEGATIVE
Protein, UA: NEGATIVE
Spec Grav, UA: <=1.005 — AB (ref 1.010–1.025)
Urobilinogen, UA: 0.2 E.U./dL
pH, UA: 7.5 (ref 5.0–8.0)

## 2017-05-12 MED ORDER — NITROFURANTOIN MONOHYD MACRO 100 MG PO CAPS: 100.0000 mg | ORAL_CAPSULE | Freq: Two times a day (BID) | ORAL | 0 refills | Status: AC

## 2017-05-12 NOTE — Patient Instructions (Signed)

## 2017-05-12 NOTE — Progress Notes (Signed)
[place]  Chief Complaint  Patient presents with  . Urinary Tract Infection    Subjective:    Patient ID: Jill Porter, female    DOB: [birth year], 51 y.o.   MRN: [id number]   Urinary Tract Infection: Patient is a 51 y/o woman with history of recent parathyroidectomy and pyelonephritis who complains of dysuria, frequency, incomplete bladder emptying and urgency. Most recently, she was treated with a two week course of ciprofloxacin ending on 05/03/2017. She says she felt better but then symptoms recurred. She has had symptoms for several days. Patient also complains of headache. Patient denies fever and vaginal discharge. She denies backpain. Patient does have a history of recurrent UTI, she used to have yearly supply of PRN abx for symptoms. Patient denies vaginal discharge and denies new sexual partners. The patient denies recent travel outside of the [place].  Review of Systems  Constitutional: Positive for chills and malaise/fatigue. Negative for diaphoresis, fever and weight loss.  Genitourinary: Positive for dysuria, frequency and urgency. Negative for flank pain and hematuria.  Musculoskeletal: Negative for back pain.  Neurological: Positive for headaches. Negative for dizziness and weakness.       Objective:   BP (!) 144/92 (BP Location: Left Arm, Patient Position: Sitting, Cuff Size: Normal)   Pulse 88   Temp 97.9 F (36.6 C) (Oral)   Resp 16   Wt 130 lb (59 kg)   BMI 22.31 kg/m   Patient Active Problem List   Diagnosis Date Noted  . Special screening for malignant neoplasms, colon   . Perimenopausal vasomotor symptoms 06/10/2015  . Status post hysterectomy 06/10/2015  . History of cervical dysplasia 06/10/2015  . History of oophorectomy, unilateral 06/10/2015  . Cholelithiasis 03/31/2015  . Melanoma ([place]) 03/31/2015  . Hx of abnormal cervical Pap smear 03/31/2015  . Tinea versicolor 03/31/2015  . Vitamin D  deficiency 03/31/2015  . Hyperlipemia 03/31/2015  . Depression 03/31/2015  . Acute anxiety 03/31/2015  . Insomnia 03/31/2015  . Hypertension 03/31/2015  . Plantar fasciitis 03/31/2015  . Frequent UTI 03/31/2015  . Discoid eczema 03/31/2015    Outpatient Encounter Prescriptions as of 05/12/2017  Medication Sig  . estradiol (ESTRACE) 1 MG tablet Take 1 tablet (1 mg total) by mouth daily.  . hydrochlorothiazide (HYDRODIURIL) 25 MG tablet TAKE 1 TABLET BY MOUTH DAILY  . losartan (COZAAR) 50 MG tablet TAKE 1 TABLET (50 MG TOTAL) BY MOUTH DAILY.  . metoprolol tartrate (LOPRESSOR) 25 MG tablet Take 0.5 tablets (12.5 mg total) by mouth 2 (two) times daily.  . naproxen (NAPROSYN) 500 MG tablet Take 1 tablet (500 mg total) by mouth 2 (two) times daily with a meal.  . ondansetron (ZOFRAN) 4 MG tablet Take 1 tablet (4 mg total) by mouth every 8 (eight) hours as needed for nausea or vomiting.  Marland Kitchen zolpidem (AMBIEN) 10 MG tablet Take 1 tablet (10 mg total) by mouth at bedtime as needed for sleep.  . [DISCONTINUED] aspirin EC 81 MG tablet Take 81 mg by mouth daily.  . [DISCONTINUED] losartan (COZAAR) 50 MG tablet TAKE 1 TABLET (50 MG TOTAL) BY MOUTH DAILY.  . [DISCONTINUED] phenazopyridine (PYRIDIUM) 100 MG tablet Take 1 tablet (100 mg total) by mouth 3 (three) times daily as needed for pain.   No facility-administered encounter medications on file as of 05/12/2017.     Allergies  Allergen Reactions  . Sulfa Antibiotics Hives  . Penicillins Itching and Rash       Physical  Exam  Constitutional: She is oriented to person, place, and time. She appears well-developed and well-nourished. No distress.  Cardiovascular: Normal rate and regular rhythm.   Pulmonary/Chest: Effort normal and breath sounds normal.  Abdominal: Soft. Bowel sounds are normal. She exhibits no distension. There is tenderness in the suprapubic area. There is no rebound, no guarding and no CVA tenderness.  Neurological: She is  alert and oriented to person, place, and time.  Skin: Skin is warm and dry. She is not diaphoretic.  Psychiatric: She has a normal mood and affect. Her behavior is normal.       Assessment & Plan:  1. Cystitis  Clinically looks better than last visit. She has had two weeks of cipro. Her symptoms more consistent with cystitis today, will try Macrobid as she has tolerated this well in the past. If she worsens significantly with fevers, back pain, N/V, should be re-evaluated.   - nitrofurantoin, macrocrystal-monohydrate, (MACROBID) 100 MG capsule; Take 1 capsule (100 mg total) by mouth 2 (two) times daily.  Dispense: 10 capsule; Refill: 0   Return if symptoms worsen or fail to improve.  The entirety of the information documented in the History of Present Illness, Review of Systems and Physical Exam were personally obtained by me. Portions of this information were initially documented by Ashley Royalty, CMA and reviewed by me for thoroughness and accuracy.

## 2017-05-15 LAB — CULTURE, URINE COMPREHENSIVE

## 2017-05-15 LAB — PLEASE NOTE

## 2017-05-16 ENCOUNTER — Telehealth: Payer: Self-pay

## 2017-05-16 DIAGNOSIS — Z87440 Personal history of urinary (tract) infections: Secondary | ICD-10-CM

## 2017-05-16 DIAGNOSIS — R350 Frequency of micturition: Secondary | ICD-10-CM

## 2017-05-16 DIAGNOSIS — R300 Dysuria: Secondary | ICD-10-CM

## 2017-05-16 NOTE — Telephone Encounter (Signed)
Referral placed, she should be contacted shortly.

## 2017-05-16 NOTE — Telephone Encounter (Signed)
-----   Message from [name], [place] sent at 05/15/2017  1:13 PM EDT ----- Urine culture showed no growth. If she continues to be symptomatic, I would suggest visiting urology.

## 2017-05-16 NOTE — Telephone Encounter (Signed)
Pt advised.  She would like to proceed with a Urology referral.   Thanks,   -Mickel Baas

## 2017-06-03 NOTE — Progress Notes (Addendum)
06/06/2017 9:49 AM   Jill Porter 24-Aug-1966 [id number]  Referring provider: Jerrol Porter., MD [address] [place], [place] [address]  Chief Complaint  Patient presents with  . New Patient (Initial Visit)    recurrent uti / fullness referred by Jill Collet PA    HPI: Patient is a 51 -year-old Caucasian female who is referred to [place] by, Jill Post PA-C, for recurrent urinary tract infections.  Patient states that she has had 5 to 6 urinary tract infections over the last year.  Reviewing her records,  she has had one positive urine culture for Escherichia coli.  She was placed on 2 rounds of Cipro.  She was then placed on Macrobid.    Her symptoms with a urinary tract infection consist of dysuria, urgency, cloudy urine and general malaise.  She denies dysuria, gross hematuria, suprapubic pain, back pain, abdominal pain or flank pain at this time.  She has not had any recent fevers, chills, nausea or vomiting.   She does not have a history of nephrolithiasis, GU surgery or GU trauma.   She is not sexually active.    She is postmenopausal.  She was on estrogen in the past x 6 weeks for hot flashes.  She discontinued the medication as she felt worse.    She admits to constipation.    She does engage in good perineal hygiene. She does not take tub baths.   She is having mild stress incontinence that started last summer.  She is not wearing pads.  She is having frequency x 8-10, urgency and nocturia x 1-2 that has been last standing.  She is not having pain with bladder filling.  Her UA is negative today.  Her PVR is 16 mL.    She underwent a contrast CT on 12/08/2016 for unexplained night sweats, like symptoms, fatigue and shortness of breath.  Findings were positive for small exophytic cyst in the midpole of the left kidney measuring 1.7 cm. No hydronephrosis or suspicious renal mass. Adrenal glands and urinary bladder are unremarkable.  No acute  cardiopulmonary disease.  Scattered hypodensities in the spleen, nonspecific, the largest 6 mm. These are most likely benign can't may reflect small cysts or hemangiomas. These could be followed with repeat CT or MRI in 6 months.  Prior hysterectomy and cholecystectomy.  Moderate stool burden throughout the colon.  She is drinking 2 16 ounce bottles  of water daily.   Diet sodas x 2.  Caffeine free lemon flavored diet drinks.     Reviewed referral notes - see above  PMH: Past Medical History:  Diagnosis Date  . Cervical dysphagia   . Climacteric   . Hypertension   . Melanoma ([place])   . Motion sickness    anytime moving  . Pelvic adhesions   . Recurrent cystitis     Surgical History: Past Surgical History:  Procedure Laterality Date  . ABDOMINAL HYSTERECTOMY  2000   due to pre cancerous cells  . BASAL CELL CARCINOMA EXCISION  2013   removed from forehead and Lt shooulder  . BREAST CYST ASPIRATION  01/2010  . CHOLECYSTECTOMY  06/19/2012  . COLONOSCOPY WITH PROPOFOL N/A 07/06/2015   Procedure: COLONOSCOPY WITH PROPOFOL;  Surgeon: [name], MD;  Location: [facility];  Service: Endoscopy;  Laterality: N/A;  . EXPLORATORY LAPAROTOMY     lysis of adhesions  . LIPOSUCTION    . MELANOMA EXCISION  06/2012   Removed from [place]  shoulder  . OVARY SURGERY  1997   hx of removal of ovary and adhesions  . PARATHYROIDECTOMY  04/14/2017  . ROTATOR CUFF REPAIR      Home Medications:  Allergies as of 06/06/2017      Reactions   Sulfa Antibiotics Hives   Penicillins Itching, Rash      Medication List       Accurate as of 06/06/17  9:49 AM. Always use your most recent med list.          conjugated estrogens vaginal cream Commonly known as:  PREMARIN Place 1 Applicatorful vaginally daily. Apply 0.3m (pea-sized amount)  just inside the vaginal introitus with a finger-tip every night for two weeks and then Monday, Wednesday and Friday nights.   estradiol 1 MG tablet Commonly  known as:  ESTRACE Take 1 tablet (1 mg total) by mouth daily.   hydrochlorothiazide 25 MG tablet Commonly known as:  HYDRODIURIL TAKE 1 TABLET BY MOUTH DAILY   losartan 50 MG tablet Commonly known as:  COZAAR TAKE 1 TABLET (50 MG TOTAL) BY MOUTH DAILY.   metoprolol tartrate 25 MG tablet Commonly known as:  LOPRESSOR Take 0.5 tablets (12.5 mg total) by mouth 2 (two) times daily.   naproxen 500 MG tablet Commonly known as:  NAPROSYN Take 1 tablet (500 mg total) by mouth 2 (two) times daily with a meal.   ondansetron 4 MG tablet Commonly known as:  ZOFRAN Take 1 tablet (4 mg total) by mouth every 8 (eight) hours as needed for nausea or vomiting.   zolpidem 10 MG tablet Commonly known as:  AMBIEN Take 1 tablet (10 mg total) by mouth at bedtime as needed for sleep.            Discharge Care Instructions        Start     Ordered   06/06/17 0000  Urinalysis, Complete     06/06/17 0902   06/06/17 0000  BLADDER SCAN AMB NON-IMAGING     06/06/17 0902   06/06/17 0000  conjugated estrogens (PREMARIN) vaginal cream  Daily    Question:  Supervising Provider  Answer:  BHollice Espy  06/06/17 [id number]     Allergies:  Allergies  Allergen Reactions  . Sulfa Antibiotics Hives  . Penicillins Itching and Rash    Family History: Family History  Problem Relation Age of Onset  . Hypertension Mother   . Hypertension Father   . Heart attack Maternal Grandfather 52       MI  . Cancer Neg Hx   . Diabetes Neg Hx   . Heart disease Neg Hx   . Kidney cancer Neg Hx   . Bladder Cancer Neg Hx     Social History:  reports that she has never smoked. She has never used smokeless tobacco. She reports that she does not drink alcohol or use drugs.  ROS: UROLOGY Frequent Urination?: Yes Hard to postpone urination?: Yes Burning/pain with urination?: Yes Get up at night to urinate?: Yes Leakage of urine?: Yes Urine stream starts and stops?: No Trouble starting stream?: No Do you  have to strain to urinate?: No Blood in urine?: No Urinary tract infection?: Yes Sexually transmitted disease?: No Injury to kidneys or bladder?: No Painful intercourse?: No Weak stream?: No Currently pregnant?: No Vaginal bleeding?: No Last menstrual period?: 2000  Gastrointestinal Nausea?: No Vomiting?: No Indigestion/heartburn?: Yes Diarrhea?: No Constipation?: No  Constitutional Fever: No Night sweats?: Yes Weight loss?: No Fatigue?: No  Skin  Skin rash/lesions?: No Itching?: No  Eyes Blurred vision?: Yes Double vision?: Yes  Ears/Nose/Throat Sore throat?: No Sinus problems?: No  Hematologic/Lymphatic Swollen glands?: No Easy bruising?: No  Cardiovascular Leg swelling?: No Chest pain?: No  Respiratory Cough?: No Shortness of breath?: No  Endocrine Excessive thirst?: Yes  Musculoskeletal Back pain?: Yes Joint pain?: Yes  Neurological Headaches?: Yes Dizziness?: No  Psychologic Depression?: No Anxiety?: No  Physical Exam: BP (!) 143/91   Pulse (!) 102   Ht _0  (1.626 m)   Wt 128 lb 11.2 oz (58.4 kg)   BMI 22.09 kg/m   Constitutional: Well nourished. Alert and oriented, No acute distress. HEENT: [place] AT, moist mucus membranes. Trachea midline, no masses. Cardiovascular: No clubbing, cyanosis, or edema. Respiratory: Normal respiratory effort, no increased work of breathing. GI: Abdomen is soft, non tender, non distended, no abdominal masses. Liver and spleen not palpable.  No hernias appreciated.  Stool sample for occult testing is not indicated.   GU: No CVA tenderness.  No bladder fullness or masses.  Atrophic external genitalia, normal pubic hair distribution, no lesions.  Normal urethral meatus, no lesions, no prolapse, no discharge.   No urethral masses, tenderness and/or tenderness. No bladder fullness, tenderness or masses. Normal vagina mucosa, good estrogen effect, no discharge, no lesions, good pelvic support, Grade I cystocele is  noted.  No rectocele noted.  Cervix and uterus are surgically absent.  Anus and perineum are without rashes or lesions.    Skin: No rashes, bruises or suspicious lesions. Lymph: No cervical or inguinal adenopathy. Neurologic: Grossly intact, no focal deficits, moving all 4 extremities. Psychiatric: Normal mood and affect.  Laboratory Data: Lab Results  Component Value Date   WBC 4.1 11/23/2016   HGB 11.7 11/23/2016   HCT 35.3 11/23/2016   MCV 84 11/23/2016   PLT 283 11/23/2016    Lab Results  Component Value Date   CREATININE 0.85 11/23/2016    Lab Results  Component Value Date   HGBA1C 5.3 11/29/2016    Lab Results  Component Value Date   TSH 1.160 11/23/2016       Component Value Date/Time   CHOL 238 (H) 11/29/2016 1000   HDL 56 11/29/2016 1000   CHOLHDL 4.1 06/10/2015 0953   LDLCALC 155 (H) 11/29/2016 1000    Lab Results  Component Value Date   AST 16 11/23/2016   Lab Results  Component Value Date   ALT 14 11/23/2016    Urinalysis Negative.  See EPIC.    I have reviewed the labs.   Pertinent Imaging: CLINICAL DATA:  Unexplained night sweats, flu-like symptoms. Fatigue. Shortness of breath.  EXAM: CT CHEST, ABDOMEN, AND PELVIS WITH CONTRAST  TECHNIQUE: Multidetector CT imaging of the chest, abdomen and pelvis was performed following the standard protocol during bolus administration of intravenous contrast.  CONTRAST:  180m ISOVUE-300 IOPAMIDOL (ISOVUE-300) INJECTION 61%  COMPARISON:  None.  FINDINGS: CT CHEST FINDINGS  Cardiovascular: Heart is normal size. Aorta is normal caliber.  Mediastinum/Nodes: No mediastinal, hilar, or axillary adenopathy.  Lungs/Pleura: Lungs are clear. No focal airspace opacities or suspicious nodules. No effusions.  Musculoskeletal: Bilateral breast implants noted. Chest wall soft tissues otherwise unremarkable. No bony abnormality.  CT ABDOMEN PELVIS FINDINGS  Hepatobiliary: Prior  cholecystectomy. Mild diffuse fatty infiltration of the liver without focal abnormality.  Pancreas: No focal abnormality or ductal dilatation.  Spleen: Small scattered hypodensities in the spleen, nonspecific, the largest in the superior aspect of the spleen measuring 6 mm. Splenic  size is normal.  Adrenals/Urinary Tract: Small exophytic cyst in the midpole of the left kidney measuring 1.7 cm. No hydronephrosis or suspicious renal mass. Adrenal glands and urinary bladder are unremarkable.  Stomach/Bowel: Normal appendix. Moderate stool burden in the colon. Stomach and small bowel decompressed, unremarkable.  Vascular/Lymphatic: No evidence of aneurysm or adenopathy.  Reproductive: Prior hysterectomy.  No adnexal masses.  Other: No free fluid or free air.  Musculoskeletal: No acute bony abnormality.  IMPRESSION: No acute cardiopulmonary disease.  Scattered hypodensities in the spleen, nonspecific, the largest 6 mm. These are most likely benign can't may reflect small cysts or hemangiomas. These could be followed with repeat CT or MRI in 6 months.  Prior hysterectomy and cholecystectomy.  Moderate stool burden throughout the colon.   Electronically Signed   By: Rolm Baptise M.D.   On: 12/08/2016 09:42  Results for KAMDEN, REBER (MRN [id number]) as of 06/06/2017 10:43  Ref. Range 06/06/2017 09:14  Scan Result Unknown 16   I have independently reviewed the films.    Assessment & Plan:    1. Frequency  - criteria for recurrent UTI has not been met with 2 or more infections in 6 months or 3 or greater infections in one year   - Patient is instructed to increase their water intake until the urine is pale yellow or clear (10 to 12 cups daily)   - probiotics (yogurt, oral pills or vaginal suppositories), take cranberry pills or drink the juice and Vitamin C 1,000 mg daily to acidify the urine should be added to their daily regimen   - avoid soaking in tubs and  wipe front to back after urinating   - refer to PT  - advised them to have CATH UA's for urinalysis and culture to prevent skin contamination of the specimen  - reviewed symptoms of UTI and advised not to have urine checked or be treated for UTI if not experiencing symptoms  - discussed antibiotic stewardship with the patient    2. Vaginal atrophy  - I explained to the patient that when women go through menopause and her estrogen levels are severely diminished, the normal vaginal flora will change.  This is due to an increase of the vaginal canal's pH. Because of this, the vaginal canal may be colonized by bacteria from the rectum instead of the protective lactobacillus.  This accompanied by the loss of the mucus barrier with vaginal atrophy is a cause of recurrent urinary tract infections.  - In some studies, the use of vaginal estrogen cream has been demonstrated to reduce  recurrent urinary tract infections to one a year.   - Patient was given a sample of vaginal estrogen cream (Premarin) and instructed to apply 0.70m (pea-sized amount)  just inside the vaginal introitus with a finger-tip every night for two weeks and then Monday, Wednesday and Friday nights.  I explained to the patient that vaginally administered estrogen, which causes only a slight increase in the blood estrogen levels, have fewer contraindications and adverse systemic effects that oral HT.  - She will follow up in three months for an exam.    3. SUI  - offered behavioral therapies, bladder training, bladder control strategies and pelvic floor muscle training - refer to PT  - fluid management - encouraged to increase water intake and eliminate diet drinks  - RTC in 3 months for PVR and OAB   4. Cystocele  - see SUI above  Return in about 3 months (around 09/05/2017) for OAB questionnaire, PVR and exam.  These notes generated with voice recognition software. I apologize for typographical  errors.  Zara Council, [facility] [address] [place], [place] [address] [phone]

## 2017-06-06 ENCOUNTER — Encounter: Payer: Self-pay | Admitting: Urology

## 2017-06-06 ENCOUNTER — Ambulatory Visit (INDEPENDENT_AMBULATORY_CARE_PROVIDER_SITE_OTHER): Payer: Managed Care, Other (non HMO) | Admitting: Urology

## 2017-06-06 VITALS — BP 143/91 | HR 102 | Ht 64.0 in | Wt 128.7 lb

## 2017-06-06 DIAGNOSIS — N393 Stress incontinence (female) (male): Secondary | ICD-10-CM

## 2017-06-06 DIAGNOSIS — R350 Frequency of micturition: Secondary | ICD-10-CM | POA: Diagnosis not present

## 2017-06-06 DIAGNOSIS — N952 Postmenopausal atrophic vaginitis: Secondary | ICD-10-CM | POA: Diagnosis not present

## 2017-06-06 DIAGNOSIS — N8111 Cystocele, midline: Secondary | ICD-10-CM

## 2017-06-06 DIAGNOSIS — R300 Dysuria: Secondary | ICD-10-CM

## 2017-06-06 LAB — URINALYSIS, COMPLETE
Bilirubin, UA: NEGATIVE
Glucose, UA: NEGATIVE
Ketones, UA: NEGATIVE
Nitrite, UA: NEGATIVE
Protein, UA: NEGATIVE
RBC, UA: NEGATIVE
Specific Gravity, UA: 1.01 (ref 1.005–1.030)
Urobilinogen, Ur: 0.2 mg/dL (ref 0.2–1.0)
pH, UA: 6.5 (ref 5.0–7.5)

## 2017-06-06 LAB — MICROSCOPIC EXAMINATION
RBC, UA: NONE SEEN /hpf (ref 0–?)
WBC, UA: NONE SEEN /hpf (ref 0–?)

## 2017-06-06 LAB — BLADDER SCAN AMB NON-IMAGING: Scan Result: 16

## 2017-06-06 MED ORDER — ESTROGENS, CONJUGATED 0.625 MG/GM VA CREA: 1.0000 | TOPICAL_CREAM | Freq: Every day | VAGINAL | 12 refills | Status: DC

## 2017-06-06 NOTE — Patient Instructions (Addendum)
                                             Urinary Tract Infection Prevention Patient Education Stay Hydrated: Urinary tract infections (UTIs) are less likely to occur in someone who is drinking enough water to promote regular urination, so it is very important to stay hydrated in order to help flush out bacteria from the urinary tract. Respond to "Nature's Call": It is always a good idea to urinate as soon as you feel the need. While "holding it in" does not directly cause an infection, it can cause overdistension that can damage the lining of the bladder, making it more vulnerable to bacteria. Remove Tampons Before Going: Remember to always take out tampons before urinating, and change tampons often.  Practice Proper Bathroom Hygiene: To keep bacteria near the urethral opening to a minimum, it is important to practice proper wiping techniques (i.e. front to back wiping) to help prevent rectal bacteria from entering the uretro-genital area. It can also be helpful to take showers and avoid soaking in the bathtub.  Take a Vitamin C Supplement: About 1,000 milligrams of vitamin C taken daily can help inhibit the growth of some bacteria by acidifying the urine. Maintain Control with Cranberries: Cranberries contain hippuronic acid, which is a natural antiseptic that may help prevent the adherence of bacteria to the bladder lining. Drinking 100% pure cranberry juice or taking over the counter cranberry supplements twice daily may help to prevent an infection. However, it is important to note that cranberry juices/supplements are not helpful once a urinary tract infection (UTI) is present. Strengthen Your Core: Often, a lazy bladder (unable to empty urine properly) occurs due to lower back problem, so consider doing exercises to help strengthen your back, pelvic floor, and stomach muscles. Take D-Mannose daily  Pay Attention to Your Urine: Your urine can change color for a variety of reasons, including from  the medications you take, so pay close attention to it to monitor your overall health. One key thing to note is that if your urine is typically a darker yellow, your body is dehydrated, so you need to step up your water intake.   Apply the vaginal estrogen cream Monday night, Wednesday night and Friday nights

## 2017-06-15 ENCOUNTER — Encounter: Payer: Self-pay | Admitting: Family Medicine

## 2017-06-22 ENCOUNTER — Other Ambulatory Visit: Payer: Self-pay | Admitting: Family Medicine

## 2017-06-26 ENCOUNTER — Other Ambulatory Visit: Payer: Self-pay | Admitting: Family Medicine

## 2017-06-26 MED ORDER — ZOLPIDEM TARTRATE 10 MG PO TABS: 10.0000 mg | ORAL_TABLET | Freq: Every evening | ORAL | 2 refills | Status: DC | PRN

## 2017-06-26 NOTE — Telephone Encounter (Signed)
Pt contacted office for refill request on the following medications:  zolpidem (AMBIEN) 10 MG tablet   [facility].  OY#[phone]

## 2017-07-04 ENCOUNTER — Other Ambulatory Visit: Payer: Self-pay

## 2017-07-04 MED ORDER — ESTROGENS, CONJUGATED 0.625 MG/GM VA CREA: 1.0000 | TOPICAL_CREAM | Freq: Every day | VAGINAL | 12 refills | Status: DC

## 2017-07-23 ENCOUNTER — Other Ambulatory Visit: Payer: Self-pay | Admitting: Family Medicine

## 2017-08-08 ENCOUNTER — Ambulatory Visit: Payer: Managed Care, Other (non HMO) | Admitting: Physical Therapy

## 2017-08-14 ENCOUNTER — Ambulatory Visit: Payer: Managed Care, Other (non HMO) | Admitting: Physical Therapy

## 2017-08-23 ENCOUNTER — Ambulatory Visit: Payer: Managed Care, Other (non HMO) | Admitting: Physical Therapy

## 2017-08-29 ENCOUNTER — Encounter: Payer: Managed Care, Other (non HMO) | Admitting: Physical Therapy

## 2017-09-04 NOTE — Progress Notes (Deleted)
09/05/2017 7:31 AM   Jill Porter 1966/05/20 [id number]  Referring provider: [name]., MD [address] [place], [place] [address]  No chief complaint on file.   HPI: Patient is a 51 -year-old Caucasian female with frequency, SUI, vaginal atrophy and a cystocele who presents today for a three month follow up.    Background history Patient states that she has had 5 to 6 urinary tract infections over the last year.  Reviewing her records,  she has had one positive urine culture for Escherichia coli.  She was placed on 2 rounds of Cipro.  She was then placed on Macrobid.  Her symptoms with a urinary tract infection consist of dysuria, urgency, cloudy urine and general malaise.  She denies dysuria, gross hematuria, suprapubic pain, back pain, abdominal pain or flank pain at this time.  She has not had any recent fevers, chills, nausea or vomiting.  She does not have a history of nephrolithiasis, GU surgery or GU trauma.   She is not sexually active.  She is postmenopausal.  She was on estrogen in the past x 6 weeks for hot flashes.  She discontinued the medication as she felt worse.  She admits to constipation.  She does engage in good perineal hygiene. She does not take tub baths.  She is having mild stress incontinence that started last summer.  She is not wearing pads.  She is having frequency x 8-10, urgency and nocturia x 1-2 that has been last standing.  She is not having pain with bladder filling.  Her UA is negative today.  Her PVR is 16 mL.  She underwent a contrast CT on 12/08/2016 for unexplained night sweats, like symptoms, fatigue and shortness of breath.  Findings were positive for small exophytic cyst in the midpole of the left kidney measuring 1.7 cm. No hydronephrosis or suspicious renal mass. Adrenal glands and urinary bladder are unremarkable.  No acute cardiopulmonary disease.  Scattered hypodensities in the spleen, nonspecific, the largest 6 mm. These  are most likely benign can't may reflect small cysts or hemangiomas. These could be followed with repeat CT or MRI in 6 months.  Prior hysterectomy and cholecystectomy.  Moderate stool burden throughout the colon.  She is drinking 2 16 ounce bottles  of water daily.   Diet sodas x 2.  Caffeine free lemon flavored diet drinks.  Reviewed referral notes - see above  At her visit on 06/06/2017, she was encouraged to eliminate diet drinks, referred to PT and started on Premarin cream for her symptoms.    Today, she is experiencing urgency x *** (***), frequency x *** (***), not/is restricting fluids to avoid visits to the restroom ***, not/is engaging in toilet mapping, incontinence x *** (***) and nocturia x *** (***).   Her PVR is ***.   She has not had gross hematuria, suprapubic pain or dysuria.  She does not have fevers, chills, nausea or vomiting.     PMH: Past Medical History:  Diagnosis Date  . Cervical dysphagia   . Climacteric   . Hypertension   . Melanoma ([place])   . Motion sickness    anytime moving  . Pelvic adhesions   . Recurrent cystitis     Surgical History: Past Surgical History:  Procedure Laterality Date  . ABDOMINAL HYSTERECTOMY  2000   due to pre cancerous cells  . BASAL CELL CARCINOMA EXCISION  2013   removed from forehead and Lt [place]  . BREAST  CYST ASPIRATION  01/2010  . CHOLECYSTECTOMY  06/19/2012  . COLONOSCOPY WITH PROPOFOL N/A 07/06/2015   Procedure: COLONOSCOPY WITH PROPOFOL;  Surgeon: [name], MD;  Location: [facility];  Service: Endoscopy;  Laterality: N/A;  . EXPLORATORY LAPAROTOMY     lysis of adhesions  . LIPOSUCTION    . MELANOMA EXCISION  06/2012   Removed from [place] shoulder  . OVARY SURGERY  1997   hx of removal of ovary and adhesions  . PARATHYROIDECTOMY  04/14/2017  . ROTATOR CUFF REPAIR      Home Medications:  Allergies as of 09/05/2017      Reactions   Sulfa Antibiotics Hives   Penicillins Itching, Rash      Medication  List        Accurate as of 09/04/17  7:31 AM. Always use your most recent med list.          conjugated estrogens vaginal cream Commonly known as:  PREMARIN Place 1 Applicatorful vaginally daily. Apply 0.5mg  (pea-sized amount)  just inside the vaginal introitus with a finger-tip every night for two weeks and then Monday, Wednesday and Friday nights.   estradiol 1 MG tablet Commonly known as:  ESTRACE Take 1 tablet (1 mg total) by mouth daily.   hydrochlorothiazide 25 MG tablet Commonly known as:  HYDRODIURIL TAKE 1 TABLET BY MOUTH DAILY   losartan 50 MG tablet Commonly known as:  COZAAR TAKE 1 TABLET (50 MG TOTAL) BY MOUTH DAILY.   metoprolol tartrate 25 MG tablet Commonly known as:  LOPRESSOR TAKE 1/2 TAB BY MOUTH TWICE DAILY   naproxen 500 MG tablet Commonly known as:  NAPROSYN TAKE 1 TABLET (500 MG TOTAL) BY MOUTH 2 (TWO) TIMES DAILY WITH A MEAL.   ondansetron 4 MG tablet Commonly known as:  ZOFRAN Take 1 tablet (4 mg total) by mouth every 8 (eight) hours as needed for nausea or vomiting.   zolpidem 10 MG tablet Commonly known as:  AMBIEN Take 1 tablet (10 mg total) by mouth at bedtime as needed for sleep.       Allergies:  Allergies  Allergen Reactions  . Sulfa Antibiotics Hives  . Penicillins Itching and Rash    Family History: Family History  Problem Relation Age of Onset  . Hypertension Mother   . Hypertension Father   . Heart attack Maternal Grandfather 52       MI  . Cancer Neg Hx   . Diabetes Neg Hx   . Heart disease Neg Hx   . Kidney cancer Neg Hx   . Bladder Cancer Neg Hx     Social History:  reports that  has never smoked. she has never used smokeless tobacco. She reports that she does not drink alcohol or use drugs.  ROS:                                        Physical Exam: There were no vitals taken for this visit.  Constitutional: Well nourished. Alert and oriented, No acute distress. HEENT: [place] AT, moist  mucus membranes. Trachea midline, no masses. Cardiovascular: No clubbing, cyanosis, or edema. Respiratory: Normal respiratory effort, no increased work of breathing. GI: Abdomen is soft, non tender, non distended, no abdominal masses. Liver and spleen not palpable.  No hernias appreciated.  Stool sample for occult testing is not indicated.   GU: No CVA tenderness.  No bladder fullness  or masses.  Atrophic external genitalia, normal pubic hair distribution, no lesions.  Normal urethral meatus, no lesions, no prolapse, no discharge.   No urethral masses, tenderness and/or tenderness. No bladder fullness, tenderness or masses. Normal vagina mucosa, good estrogen effect, no discharge, no lesions, good pelvic support, Grade I cystocele is noted.  No rectocele noted.  Cervix and uterus are surgically absent.  Anus and perineum are without rashes or lesions.    Skin: No rashes, bruises or suspicious lesions. Lymph: No cervical or inguinal adenopathy. Neurologic: Grossly intact, no focal deficits, moving all 4 extremities. Psychiatric: Normal mood and affect.  Laboratory Data: Lab Results  Component Value Date   WBC 4.1 11/23/2016   HGB 11.7 11/23/2016   HCT 35.3 11/23/2016   MCV 84 11/23/2016   PLT 283 11/23/2016    Lab Results  Component Value Date   CREATININE 0.85 11/23/2016    Lab Results  Component Value Date   HGBA1C 5.3 11/29/2016    Lab Results  Component Value Date   TSH 1.160 11/23/2016       Component Value Date/Time   CHOL 238 (H) 11/29/2016 1000   HDL 56 11/29/2016 1000   CHOLHDL 4.1 06/10/2015 0953   LDLCALC 155 (H) 11/29/2016 1000    Lab Results  Component Value Date   AST 16 11/23/2016   Lab Results  Component Value Date   ALT 14 11/23/2016    Urinalysis ***  See EPIC.    I have reviewed the labs.   Pertinent Imaging: ***  Assessment & Plan:    1. Frequency  -   2. Vaginal atrophy  -   3. SUI  -   4. Cystocele  -                                No Follow-up on file.  These notes generated with voice recognition software. I apologize for typographical errors.  Zara Council, [facility] [address] [place], [place] [address] [phone]

## 2017-09-05 ENCOUNTER — Ambulatory Visit: Payer: Managed Care, Other (non HMO) | Admitting: Urology

## 2017-09-12 ENCOUNTER — Encounter: Payer: Managed Care, Other (non HMO) | Admitting: Physical Therapy

## 2017-09-21 ENCOUNTER — Other Ambulatory Visit: Payer: Self-pay | Admitting: Family Medicine

## 2017-09-21 NOTE — Telephone Encounter (Signed)
Pt contacted office for refill request on the following medications:  zolpidem (AMBIEN) 10 MG tablet  [facility].  EK#[phone]  This is pt of Dr Wilmon Arms

## 2017-09-22 MED ORDER — ZOLPIDEM TARTRATE 10 MG PO TABS: 10.0000 mg | ORAL_TABLET | Freq: Every evening | ORAL | 5 refills | Status: DC | PRN

## 2017-09-27 ENCOUNTER — Encounter: Payer: Managed Care, Other (non HMO) | Admitting: Physical Therapy

## 2017-09-27 ENCOUNTER — Ambulatory Visit (INDEPENDENT_AMBULATORY_CARE_PROVIDER_SITE_OTHER): Payer: Managed Care, Other (non HMO) | Admitting: Family Medicine

## 2017-09-27 ENCOUNTER — Encounter: Payer: Self-pay | Admitting: Family Medicine

## 2017-09-27 VITALS — BP 150/82 | HR 106 | Temp 98.4°F | Resp 14 | Wt 129.0 lb

## 2017-09-27 DIAGNOSIS — G4700 Insomnia, unspecified: Secondary | ICD-10-CM | POA: Diagnosis not present

## 2017-09-27 MED ORDER — ZOLPIDEM TARTRATE 10 MG PO TABS: 10.0000 mg | ORAL_TABLET | Freq: Every evening | ORAL | 5 refills | Status: DC | PRN

## 2017-09-27 NOTE — Progress Notes (Signed)
Patient: Jill Porter Female    DOB: Aug 14, 1966   51 y.o.   MRN: [id number] Visit Date: 09/27/2017  Today's Provider: Wilhemena Durie, MD   Chief Complaint  Patient presents with  . Follow-up   Subjective:    HPI Pt reports that she is here for a refill on her Ambien. She takes it every night and helps her sleep.  She reports that she has been feeling well.      Allergies  Allergen Reactions  . Sulfa Antibiotics Hives  . Penicillins Itching and Rash     Current Outpatient Medications:  .  hydrochlorothiazide (HYDRODIURIL) 25 MG tablet, TAKE 1 TABLET BY MOUTH DAILY, Disp: 90 tablet, Rfl: 3 .  losartan (COZAAR) 50 MG tablet, TAKE 1 TABLET (50 MG TOTAL) BY MOUTH DAILY., Disp: 90 tablet, Rfl: 3 .  zolpidem (AMBIEN) 10 MG tablet, Take 1 tablet (10 mg total) by mouth at bedtime as needed for sleep., Disp: 30 tablet, Rfl: 5 .  conjugated estrogens (PREMARIN) vaginal cream, Place 1 Applicatorful vaginally daily. Apply 0.5mg  (pea-sized amount)  just inside the vaginal introitus with a finger-tip every night for two weeks and then Monday, Wednesday and Friday nights. (Patient not taking: Reported on 09/27/2017), Disp: 30 g, Rfl: 12 .  estradiol (ESTRACE) 1 MG tablet, Take 1 tablet (1 mg total) by mouth daily. (Patient not taking: Reported on 06/06/2017), Disp: 30 tablet, Rfl: 5 .  metoprolol tartrate (LOPRESSOR) 25 MG tablet, TAKE 1/2 TAB BY MOUTH TWICE DAILY (Patient not taking: Reported on 09/27/2017), Disp: 60 tablet, Rfl: 3 .  naproxen (NAPROSYN) 500 MG tablet, TAKE 1 TABLET (500 MG TOTAL) BY MOUTH 2 (TWO) TIMES DAILY WITH A MEAL. (Patient not taking: Reported on 09/27/2017), Disp: 60 tablet, Rfl: 5 .  ondansetron (ZOFRAN) 4 MG tablet, Take 1 tablet (4 mg total) by mouth every 8 (eight) hours as needed for nausea or vomiting. (Patient not taking: Reported on 06/06/2017), Disp: 20 tablet, Rfl: 0  Review of Systems  Constitutional: Negative.   HENT: Negative.   Eyes:  Negative.   Respiratory: Negative.   Cardiovascular: Negative.   Gastrointestinal: Negative.   Endocrine: Negative.   Genitourinary: Negative.   Musculoskeletal: Negative.   Skin: Negative.   Allergic/Immunologic: Negative.   Neurological: Negative.   Hematological: Negative.   Psychiatric/Behavioral: Positive for sleep disturbance.    Social History   Tobacco Use  . Smoking status: Never Smoker  . Smokeless tobacco: Never Used  Substance Use Topics  . Alcohol use: No    Alcohol/week: 0.0 oz   Objective:   BP (!) 150/82 (BP Location: Left Arm, Patient Position: Sitting, Cuff Size: Normal)   Pulse (!) 106   Temp 98.4 F (36.9 C) (Oral)   Resp 14   Wt 129 lb (58.5 kg)   BMI 22.14 kg/m  Vitals:   09/27/17 1630  BP: (!) 150/82  Pulse: (!) 106  Resp: 14  Temp: 98.4 F (36.9 C)  TempSrc: Oral  Weight: 129 lb (58.5 kg)     Physical Exam  Constitutional: She is oriented to person, place, and time. She appears well-developed and well-nourished.  HENT:  Head: Normocephalic and atraumatic.  Eyes: Conjunctivae are normal. No scleral icterus.  Neck: No thyromegaly present.  Cardiovascular: Normal rate, regular rhythm and normal heart sounds.  Pulmonary/Chest: Effort normal and breath sounds normal.  Abdominal: Soft. Bowel sounds are normal.  Neurological: She is alert and oriented to person, place, and time.  Skin: Skin is warm and dry.  Psychiatric: She has a normal mood and affect. Her behavior is normal. Judgment and thought content normal.  PHQ9 of 9 today.      Assessment & Plan:     1. Insomnia, unspecified type  - zolpidem (AMBIEN) 10 MG tablet; Take 1 tablet (10 mg total) by mouth at bedtime as needed for sleep.  Dispense: 30 tablet; Refill: 5 2.Hypercalcemia due to hyperparathyroidism/recent parathyroidectomy      I have done the exam and reviewed the chart and it is accurate to the best of my knowledge. Development worker, community has been used and  any errors  in dictation or transcription are unintentional. Miguel Aschoff M.D. [place], MD  [facility]

## 2017-10-10 ENCOUNTER — Encounter: Payer: Managed Care, Other (non HMO) | Admitting: Physical Therapy

## 2017-11-28 ENCOUNTER — Ambulatory Visit (INDEPENDENT_AMBULATORY_CARE_PROVIDER_SITE_OTHER): Payer: Managed Care, Other (non HMO) | Admitting: Family Medicine

## 2017-11-28 ENCOUNTER — Encounter: Payer: Self-pay | Admitting: Family Medicine

## 2017-11-28 VITALS — BP 132/86 | HR 98 | Temp 97.8°F | Resp 16 | Wt 126.0 lb

## 2017-11-28 DIAGNOSIS — E785 Hyperlipidemia, unspecified: Secondary | ICD-10-CM

## 2017-11-28 DIAGNOSIS — I10 Essential (primary) hypertension: Secondary | ICD-10-CM | POA: Diagnosis not present

## 2017-11-28 DIAGNOSIS — D351 Benign neoplasm of parathyroid gland: Secondary | ICD-10-CM | POA: Diagnosis not present

## 2017-11-28 NOTE — Progress Notes (Signed)
Patient: Jill Porter Female    DOB: [birth year]   52 y.o.   MRN: [id number] Visit Date: 11/28/2017  Today's Provider: Wilhemena Durie, MD   Chief Complaint  Patient presents with  . Hypertension   Subjective:    HPI Pt is here for a follow up of hypertension. Her Bp was elevated at last OV at 150/82. Pt reports that she is not checking it at home. She is feeling well, not having any chest pain, shortness of breath, dizziness, or headache.  Overall she is feeling well.     Allergies  Allergen Reactions  . Sulfa Antibiotics Hives  . Penicillins Itching and Rash     Current Outpatient Medications:  .  hydrochlorothiazide (HYDRODIURIL) 25 MG tablet, TAKE 1 TABLET BY MOUTH DAILY, Disp: 90 tablet, Rfl: 3 .  losartan (COZAAR) 50 MG tablet, TAKE 1 TABLET (50 MG TOTAL) BY MOUTH DAILY., Disp: 90 tablet, Rfl: 3 .  naproxen (NAPROSYN) 500 MG tablet, TAKE 1 TABLET (500 MG TOTAL) BY MOUTH 2 (TWO) TIMES DAILY WITH A MEAL. (Patient taking differently: Take 500 mg by mouth 2 (two) times daily with a meal. PRN), Disp: 60 tablet, Rfl: 5 .  OTEZLA 30 MG TABS, , Disp: , Rfl:  .  zolpidem (AMBIEN) 10 MG tablet, Take 1 tablet (10 mg total) by mouth at bedtime as needed for sleep., Disp: 30 tablet, Rfl: 5 .  conjugated estrogens (PREMARIN) vaginal cream, Place 1 Applicatorful vaginally daily. Apply 0.5mg  (pea-sized amount)  just inside the vaginal introitus with a finger-tip every night for two weeks and then Monday, Wednesday and Friday nights. (Patient not taking: Reported on 09/27/2017), Disp: 30 g, Rfl: 12 .  estradiol (ESTRACE) 1 MG tablet, Take 1 tablet (1 mg total) by mouth daily. (Patient not taking: Reported on 06/06/2017), Disp: 30 tablet, Rfl: 5 .  metoprolol tartrate (LOPRESSOR) 25 MG tablet, TAKE 1/2 TAB BY MOUTH TWICE DAILY (Patient not taking: Reported on 09/27/2017), Disp: 60 tablet, Rfl: 3 .  ondansetron (ZOFRAN) 4 MG tablet, Take 1 tablet (4 mg total) by mouth every 8 (eight)  hours as needed for nausea or vomiting. (Patient not taking: Reported on 06/06/2017), Disp: 20 tablet, Rfl: 0  Review of Systems  Constitutional: Negative.   HENT: Negative.   Eyes: Negative.   Respiratory: Negative.   Cardiovascular: Negative.   Gastrointestinal: Negative.   Endocrine: Negative.   Genitourinary: Negative.   Musculoskeletal: Negative.   Skin: Negative.   Allergic/Immunologic: Negative.   Neurological: Negative.   Hematological: Negative.   Psychiatric/Behavioral: Negative.     Social History   Tobacco Use  . Smoking status: Never Smoker  . Smokeless tobacco: Never Used  Substance Use Topics  . Alcohol use: No    Alcohol/week: 0.0 oz   Objective:   BP 132/86 (BP Location: Left Arm, Patient Position: Sitting, Cuff Size: Normal)   Pulse 98   Temp 97.8 F (36.6 C) (Oral)   Resp 16   Wt 126 lb (57.2 kg)   BMI 21.63 kg/m  Vitals:   11/28/17 1113  BP: 132/86  Pulse: 98  Resp: 16  Temp: 97.8 F (36.6 C)  TempSrc: Oral  Weight: 126 lb (57.2 kg)     Physical Exam  Constitutional: She is oriented to person, place, and time. She appears well-developed and well-nourished.  HENT:  Head: Normocephalic and atraumatic.  Eyes: Conjunctivae and EOM are normal. Pupils are equal, round, and reactive to light. No scleral  icterus.  Neck: Normal range of motion. Neck supple.  Cardiovascular: Normal rate, regular rhythm, normal heart sounds and intact distal pulses.  Pulmonary/Chest: Effort normal and breath sounds normal.  Abdominal: Soft.  Musculoskeletal: Normal range of motion.  Lymphadenopathy:    She has no cervical adenopathy.  Neurological: She is alert and oriented to person, place, and time. She has normal reflexes.  Skin: Skin is warm and dry.  Psychiatric: She has a normal mood and affect. Her behavior is normal. Judgment and thought content normal.        Assessment & Plan:     1. Essential hypertension Better today. Follow.  - CBC with  Differential/Platelet - TSH  2. Hyperlipidemia, unspecified hyperlipidemia type  - Lipid panel - Comprehensive metabolic panel 3.s/p parathyroidectomy  F/u calcium. 4.Chronic Insomnia    HPI, Exam, and A&P Transcribed under the direction and in the presence of Richard L. Cranford Mon, MD  Electronically Signed: Katina Dung, [place], MD  [facility]

## 2017-12-05 LAB — COMPREHENSIVE METABOLIC PANEL
ALT: 13 IU/L (ref 0–32)
AST: 15 IU/L (ref 0–40)
Albumin/Globulin Ratio: 1.6 (ref 1.2–2.2)
Albumin: 4.5 g/dL (ref 3.5–5.5)
Alkaline Phosphatase: 70 IU/L (ref 39–117)
BUN/Creatinine Ratio: 10 (ref 9–23)
BUN: 9 mg/dL (ref 6–24)
Bilirubin Total: 0.3 mg/dL (ref 0.0–1.2)
CO2: 26 mmol/L (ref 20–29)
Calcium: 9.6 mg/dL (ref 8.7–10.2)
Chloride: 97 mmol/L (ref 96–106)
Creatinine, Ser: 0.9 mg/dL (ref 0.57–1.00)
GFR calc Af Amer: 85 mL/min/{1.73_m2} (ref >59)
GFR calc non Af Amer: 74 mL/min/{1.73_m2} (ref >59)
Globulin, Total: 2.9 g/dL (ref 1.5–4.5)
Glucose: 91 mg/dL (ref 65–99)
Potassium: 3.7 mmol/L (ref 3.5–5.2)
Sodium: 140 mmol/L (ref 134–144)
Total Protein: 7.4 g/dL (ref 6.0–8.5)

## 2017-12-05 LAB — TSH: TSH: 0.809 u[IU]/mL (ref 0.450–4.500)

## 2017-12-05 LAB — CBC WITH DIFFERENTIAL/PLATELET
Basophils Absolute: 0 10*3/uL (ref 0.0–0.2)
Basos: 1 %
EOS (ABSOLUTE): 0.1 10*3/uL (ref 0.0–0.4)
Eos: 3 %
Hematocrit: 38.6 % (ref 34.0–46.6)
Hemoglobin: 13.1 g/dL (ref 11.1–15.9)
Immature Grans (Abs): 0 10*3/uL (ref 0.0–0.1)
Immature Granulocytes: 0 %
Lymphocytes Absolute: 2 10*3/uL (ref 0.7–3.1)
Lymphs: 39 %
MCH: 28.1 pg (ref 26.6–33.0)
MCHC: 33.9 g/dL (ref 31.5–35.7)
MCV: 83 fL (ref 79–97)
Monocytes Absolute: 0.4 10*3/uL (ref 0.1–0.9)
Monocytes: 7 %
Neutrophils Absolute: 2.7 10*3/uL (ref 1.4–7.0)
Neutrophils: 50 %
Platelets: 291 10*3/uL (ref 150–379)
RBC: 4.67 x10E6/uL (ref 3.77–5.28)
RDW: 14.1 % (ref 12.3–15.4)
WBC: 5.3 10*3/uL (ref 3.4–10.8)

## 2017-12-05 LAB — LIPID PANEL
Chol/HDL Ratio: 4.3 ratio (ref 0.0–4.4)
Cholesterol, Total: 243 mg/dL — ABNORMAL HIGH (ref 100–199)
HDL: 57 mg/dL (ref >39)
LDL Calculated: 153 mg/dL — ABNORMAL HIGH (ref 0–99)
Triglycerides: 167 mg/dL — ABNORMAL HIGH (ref 0–149)
VLDL Cholesterol Cal: 33 mg/dL (ref 5–40)

## 2017-12-18 ENCOUNTER — Other Ambulatory Visit: Payer: Self-pay | Admitting: Family Medicine

## 2018-02-28 ENCOUNTER — Ambulatory Visit (INDEPENDENT_AMBULATORY_CARE_PROVIDER_SITE_OTHER): Payer: Managed Care, Other (non HMO) | Admitting: Family Medicine

## 2018-02-28 ENCOUNTER — Telehealth: Payer: Self-pay

## 2018-02-28 ENCOUNTER — Ambulatory Visit
Admission: RE | Admit: 2018-02-28 | Discharge: 2018-02-28 | Disposition: A | Discharge status: Home / Self Care | Payer: Managed Care, Other (non HMO) | Source: Ambulatory Visit | Attending: Family Medicine | Admitting: Family Medicine

## 2018-02-28 ENCOUNTER — Other Ambulatory Visit: Payer: Self-pay

## 2018-02-28 VITALS — BP 150/100 | HR 96 | Temp 97.6°F | Resp 16 | Wt 126.0 lb

## 2018-02-28 DIAGNOSIS — M79642 Pain in left hand: Secondary | ICD-10-CM | POA: Diagnosis not present

## 2018-02-28 DIAGNOSIS — M79641 Pain in right hand: Secondary | ICD-10-CM | POA: Insufficient documentation

## 2018-02-28 DIAGNOSIS — M79673 Pain in unspecified foot: Secondary | ICD-10-CM

## 2018-02-28 DIAGNOSIS — G629 Polyneuropathy, unspecified: Secondary | ICD-10-CM

## 2018-02-28 DIAGNOSIS — M25539 Pain in unspecified wrist: Secondary | ICD-10-CM | POA: Diagnosis not present

## 2018-02-28 MED ORDER — MELOXICAM 15 MG PO TABS: 15.0000 mg | ORAL_TABLET | Freq: Every day | ORAL | 5 refills | Status: DC

## 2018-02-28 NOTE — Progress Notes (Signed)
Jill Porter  MRN: [id number] DOB: 1966/05/11  Subjective:  HPI   The patient is a 52 year old female who presents today for evaluation of bilateral hand pain.  She states that about 1.5 years ago she started noticing a heat intolerance.  This coincided with her parathyroid surgery. Since that time she has had several other associated symptoms which include:   Hand pain, right worse left, decreased mobility of hands, difficulty with fine motor movements, tingling in hands-forearms-feet up to her knees, and sometimes her face.  She states that her facial tingling is sometimes the whole left side of her face.  She states this is daily with it being gradually progressively worsening and especially bothersome when triggered by heat.   The patient also complains of twitching in her left calf, bilateral hands in the thumb area and mid back.  She notes this is daily as well.  She sometinmes drops things in her hands.  The patient notices that the joint of her big toe bilateral hurts daily as well.   Pt has been stressed recently as her son has been dealing with seizures.  Patient Active Problem List   Diagnosis Date Noted  . Special screening for malignant neoplasms, colon   . Perimenopausal vasomotor symptoms 06/10/2015  . Status post hysterectomy 06/10/2015  . History of cervical dysplasia 06/10/2015  . History of oophorectomy, unilateral 06/10/2015  . Cholelithiasis 03/31/2015  . Melanoma ([place]) 03/31/2015  . Hx of abnormal cervical Pap smear 03/31/2015  . Tinea versicolor 03/31/2015  . Vitamin D deficiency 03/31/2015  . Hyperlipemia 03/31/2015  . Depression 03/31/2015  . Acute anxiety 03/31/2015  . Insomnia 03/31/2015  . Hypertension 03/31/2015  . Plantar fasciitis 03/31/2015  . Frequent UTI 03/31/2015  . Discoid eczema 03/31/2015    Past Medical History:  Diagnosis Date  . Cervical dysphagia   . Climacteric   . Hypertension   . Melanoma ([place])   . Motion sickness    anytime moving  . Pelvic adhesions   . Recurrent cystitis     Social History   Socioeconomic History  . Marital status: Married    Spouse name: Not on file  . Number of children: Not on file  . Years of education: Not on file  . Highest education level: Not on file  Occupational History  . Not on file  Social Needs  . Financial resource strain: Not on file  . Food insecurity:    Worry: Not on file    Inability: Not on file  . Transportation needs:    Medical: Not on file    Non-medical: Not on file  Tobacco Use  . Smoking status: Never Smoker  . Smokeless tobacco: Never Used  Substance and Sexual Activity  . Alcohol use: No    Alcohol/week: 0.0 oz  . Drug use: No  . Sexual activity: Yes    Birth control/protection: Surgical  Lifestyle  . Physical activity:    Days per week: Not on file    Minutes per session: Not on file  . Stress: Not on file  Relationships  . Social connections:    Talks on phone: Not on file    Gets together: Not on file    Attends religious service: Not on file    Active member of club or organization: Not on file    Attends meetings of clubs or organizations: Not on file    Relationship status: Not on file  . Intimate partner violence:  Fear of current or ex partner: Not on file    Emotionally abused: Not on file    Physically abused: Not on file    Forced sexual activity: Not on file  Other Topics Concern  . Not on file  Social History Narrative  . Not on file    Outpatient Encounter Medications as of 02/28/2018  Medication Sig  . hydrochlorothiazide (HYDRODIURIL) 25 MG tablet TAKE 1 TABLET BY MOUTH DAILY  . losartan (COZAAR) 50 MG tablet TAKE 1 TABLET (50 MG TOTAL) BY MOUTH DAILY.  . naproxen (NAPROSYN) 500 MG tablet TAKE 1 TABLET (500 MG TOTAL) BY MOUTH 2 (TWO) TIMES DAILY WITH A MEAL. (Patient taking differently: Take 500 mg by mouth 2 (two) times daily with a meal. PRN)  . zolpidem (AMBIEN) 10 MG tablet Take 1 tablet (10 mg  total) by mouth at bedtime as needed for sleep.  . [DISCONTINUED] conjugated estrogens (PREMARIN) vaginal cream Place 1 Applicatorful vaginally daily. Apply 0.5mg  (pea-sized amount)  just inside the vaginal introitus with a finger-tip every night for two weeks and then Monday, Wednesday and Friday nights. (Patient not taking: Reported on 09/27/2017)  . [DISCONTINUED] estradiol (ESTRACE) 1 MG tablet Take 1 tablet (1 mg total) by mouth daily. (Patient not taking: Reported on 06/06/2017)  . [DISCONTINUED] metoprolol tartrate (LOPRESSOR) 25 MG tablet TAKE 1/2 TAB BY MOUTH TWICE DAILY (Patient not taking: Reported on 09/27/2017)  . [DISCONTINUED] ondansetron (ZOFRAN) 4 MG tablet Take 1 tablet (4 mg total) by mouth every 8 (eight) hours as needed for nausea or vomiting. (Patient not taking: Reported on 06/06/2017)  . [DISCONTINUED] OTEZLA 30 MG TABS    No facility-administered encounter medications on file as of 02/28/2018.     Allergies  Allergen Reactions  . Sulfa Antibiotics Hives  . Penicillins Itching and Rash    Review of Systems  Constitutional: Positive for malaise/fatigue. Negative for fever.  Eyes: Negative.   Respiratory: Negative for cough, shortness of breath and wheezing.   Cardiovascular: Negative for chest pain, palpitations, orthopnea, claudication and leg swelling.  Gastrointestinal: Negative.   Musculoskeletal: Positive for joint pain (toe joint). Negative for back pain, falls, myalgias and neck pain.  Skin: Negative for rash.  Neurological: Positive for dizziness (several days in April she had vertigo), tingling, tremors (a few times she has noticed this.), sensory change, focal weakness (arms) and headaches (possibly allergy/sinus related). Negative for speech change, seizures, loss of consciousness and weakness.  Endo/Heme/Allergies: Negative.   Psychiatric/Behavioral: Negative.     Objective:  BP (!) 150/100 (BP Location: Right Arm, Patient Position: Sitting, Cuff Size:  Normal)   Pulse 96   Temp 97.6 F (36.4 C) (Oral)   Resp 16   Wt 126 lb (57.2 kg)   SpO2 98%   BMI 21.63 kg/m   Physical Exam  Constitutional: She is oriented to person, place, and time and well-developed, well-nourished, and in no distress.  HENT:  Head: Normocephalic.  Eyes: Conjunctivae are normal. No scleral icterus.  Neck: No thyromegaly present.  Cardiovascular: Normal rate, regular rhythm and normal heart sounds.  Pulmonary/Chest: Effort normal and breath sounds normal.  Abdominal: Soft.  Neurological: She is alert and oriented to person, place, and time. Gait normal. GCS score is 15.  Nonfocal.  Skin: Skin is warm and dry.  Psychiatric: Mood, memory, affect and judgment normal.    Assessment and Plan :   1. Pain in both hands  - Comprehensive metabolic panel - ANA - Sedimentation rate - Rheumatoid  factor - DG Hand Complete Right; Future - DG Hand Complete Left; Future - meloxicam (MOBIC) 15 MG tablet; Take 1 tablet (15 mg total) by mouth daily.  Dispense: 30 tablet; Refill: 5  2. Pain of foot, unspecified laterality  - Comprehensive metabolic panel - ANA - Sedimentation rate - Rheumatoid factor - meloxicam (MOBIC) 15 MG tablet; Take 1 tablet (15 mg total) by mouth daily.  Dispense: 30 tablet; Refill: 5  3. Pain in wrist, unspecified laterality  - Comprehensive metabolic panel - ANA - Sedimentation rate - Rheumatoid factor - meloxicam (MOBIC) 15 MG tablet; Take 1 tablet (15 mg total) by mouth daily.  Dispense: 30 tablet; Refill: 5  4. Neuropathy May need referral. - Vitamin B12 5.s/p parathyroidectomy  I have done the exam and reviewed the chart and it is accurate to the best of my knowledge. Development worker, community has been used and  any errors in dictation or transcription are unintentional. Miguel Aschoff M.D. [facility]

## 2018-02-28 NOTE — Patient Instructions (Signed)
Stop naproxen and start Meloxicam. Get labs drawn across the hall and X-rays done across the street. Follow up in 2 weeks.

## 2018-02-28 NOTE — Telephone Encounter (Signed)
-----   Message from [name]., MD sent at 02/28/2018  1:34 PM EDT ----- Mild arthritic changes of left hand.

## 2018-02-28 NOTE — Telephone Encounter (Signed)
Patient advised.KW 

## 2018-03-01 LAB — COMPREHENSIVE METABOLIC PANEL
ALT: 13 IU/L (ref 0–32)
AST: 14 IU/L (ref 0–40)
Albumin/Globulin Ratio: 1.6 (ref 1.2–2.2)
Albumin: 4.5 g/dL (ref 3.5–5.5)
Alkaline Phosphatase: 72 IU/L (ref 39–117)
BUN/Creatinine Ratio: 11 (ref 9–23)
BUN: 10 mg/dL (ref 6–24)
Bilirubin Total: <0.2 mg/dL (ref 0.0–1.2)
CO2: 27 mmol/L (ref 20–29)
Calcium: 9.5 mg/dL (ref 8.7–10.2)
Chloride: 97 mmol/L (ref 96–106)
Creatinine, Ser: 0.92 mg/dL (ref 0.57–1.00)
GFR calc Af Amer: 83 mL/min/{1.73_m2} (ref >59)
GFR calc non Af Amer: 72 mL/min/{1.73_m2} (ref >59)
Globulin, Total: 2.8 g/dL (ref 1.5–4.5)
Glucose: 88 mg/dL (ref 65–99)
Potassium: 3.6 mmol/L (ref 3.5–5.2)
Sodium: 140 mmol/L (ref 134–144)
Total Protein: 7.3 g/dL (ref 6.0–8.5)

## 2018-03-01 LAB — RHEUMATOID FACTOR: Rhuematoid fact SerPl-aCnc: <10 IU/mL (ref 0.0–13.9)

## 2018-03-01 LAB — VITAMIN B12: Vitamin B-12: 518 pg/mL (ref 232–1245)

## 2018-03-01 LAB — ANA: Anti Nuclear Antibody(ANA): NEGATIVE

## 2018-03-01 LAB — SEDIMENTATION RATE: Sed Rate: 6 mm/hr (ref 0–40)

## 2018-03-07 ENCOUNTER — Telehealth: Payer: Self-pay

## 2018-03-07 NOTE — Telephone Encounter (Signed)
Advised  ED 

## 2018-03-07 NOTE — Telephone Encounter (Signed)
-----   Message from [name]., MD sent at 03/06/2018  5:31 PM EDT ----- Labs OK.

## 2018-03-07 NOTE — Telephone Encounter (Signed)
[facility]  ED   ----- Message from [name]., MD sent at 03/06/2018  5:31 PM EDT ----- Labs OK.

## 2018-03-14 ENCOUNTER — Ambulatory Visit (INDEPENDENT_AMBULATORY_CARE_PROVIDER_SITE_OTHER): Payer: Managed Care, Other (non HMO) | Admitting: Family Medicine

## 2018-03-14 ENCOUNTER — Encounter: Payer: Self-pay | Admitting: Family Medicine

## 2018-03-14 VITALS — BP 122/82 | HR 104 | Temp 97.8°F | Resp 14 | Wt 127.0 lb

## 2018-03-14 DIAGNOSIS — I10 Essential (primary) hypertension: Secondary | ICD-10-CM

## 2018-03-14 DIAGNOSIS — M79641 Pain in right hand: Secondary | ICD-10-CM | POA: Diagnosis not present

## 2018-03-14 DIAGNOSIS — M79642 Pain in left hand: Secondary | ICD-10-CM

## 2018-03-14 DIAGNOSIS — M25539 Pain in unspecified wrist: Secondary | ICD-10-CM

## 2018-03-14 NOTE — Progress Notes (Signed)
Patient: Jill Porter Female    DOB: [birth year]   53 y.o.   MRN: [id number] Visit Date: 03/14/2018  Today's Provider: Wilhemena Durie, MD   Chief Complaint  Patient presents with  . Follow-up   Subjective:    HPI Pt is here today for a 2 week follow up of hand pain, wrist pain and neuropathy.  She had RA labs drawn and they were normal, X-rays showed arthritic changes of left hand. Right hand normal. She was started on Meloxicam. She reports that she can not tell much of a difference. She has some good days and some bad days. She recently had a chemo treatment on her face and it is burning today.      Allergies  Allergen Reactions  . Sulfa Antibiotics Hives  . Penicillins Itching and Rash     Current Outpatient Medications:  .  hydrochlorothiazide (HYDRODIURIL) 25 MG tablet, TAKE 1 TABLET BY MOUTH DAILY, Disp: 90 tablet, Rfl: 3 .  losartan (COZAAR) 50 MG tablet, TAKE 1 TABLET (50 MG TOTAL) BY MOUTH DAILY., Disp: 90 tablet, Rfl: 3 .  meloxicam (MOBIC) 15 MG tablet, Take 1 tablet (15 mg total) by mouth daily., Disp: 30 tablet, Rfl: 5 .  zolpidem (AMBIEN) 10 MG tablet, Take 1 tablet (10 mg total) by mouth at bedtime as needed for sleep., Disp: 30 tablet, Rfl: 5  Review of Systems  Constitutional: Negative.   HENT: Negative.   Eyes: Negative.   Respiratory: Negative.   Cardiovascular: Negative.   Gastrointestinal: Negative.   Endocrine: Negative.   Genitourinary: Negative.   Musculoskeletal: Positive for arthralgias.  Skin: Negative.   Allergic/Immunologic: Negative.   Neurological: Negative.   Hematological: Negative.   Psychiatric/Behavioral: Negative.     Social History   Tobacco Use  . Smoking status: Never Smoker  . Smokeless tobacco: Never Used  Substance Use Topics  . Alcohol use: No    Alcohol/week: 0.0 oz   Objective:   BP 122/82 (BP Location: Left Arm, Patient Position: Sitting, Cuff Size: Normal)   Pulse (!) 104   Temp 97.8 F (36.6 C)  (Oral)   Resp 14   Wt 127 lb (57.6 kg)   BMI 21.80 kg/m  Vitals:   03/14/18 0817  BP: 122/82  Pulse: (!) 104  Resp: 14  Temp: 97.8 F (36.6 C)  TempSrc: Oral  Weight: 127 lb (57.6 kg)     Physical Exam  Constitutional: She is oriented to person, place, and time. She appears well-developed and well-nourished.  HENT:  Head: Normocephalic and atraumatic.  Right Ear: External ear normal.  Left Ear: External ear normal.  Nose: Nose normal.  Eyes: Conjunctivae are normal. No scleral icterus.  Neck: No thyromegaly present.  Cardiovascular: Normal rate, regular rhythm and normal heart sounds.  Pulmonary/Chest: Effort normal and breath sounds normal.  Abdominal: Soft.  Musculoskeletal: She exhibits deformity.  Mild synovial swelling of both hands--first and second MCPs.  Neurological: She is alert and oriented to person, place, and time.  Skin: Skin is warm and dry.  Psychiatric: She has a normal mood and affect. Her behavior is normal. Judgment and thought content normal.        Assessment & Plan:     1. Pain in both hands  - Ambulatory referral to Rheumatology - predniSONE (STERAPRED UNI-PAK 21 TAB) 10 MG (21) TBPK tablet; Take 6 tablets the first day then decrease by one daily until finished.  Dispense: 21 tablet; Refill:  0  2. Pain in wrist, unspecified laterality  - Ambulatory referral to Rheumatology - predniSONE (STERAPRED UNI-PAK 21 TAB) 10 MG (21) TBPK tablet; Take 6 tablets the first day then decrease by one daily until finished.  Dispense: 21 tablet; Refill: 0 3.health Maintenance Pt has finished Shingrix. 4.HTN     I have done the exam and reviewed the above chart and it is accurate to the best of my knowledge. Development worker, community has been used in this note in any air is in the dictation or transcription are unintentional.  Wilhemena Durie, MD  [place]

## 2018-03-15 MED ORDER — PREDNISONE 10 MG (21) PO TBPK: ORAL_TABLET | ORAL | 0 refills | Status: DC

## 2018-03-26 ENCOUNTER — Telehealth: Payer: Self-pay | Admitting: Family Medicine

## 2018-03-26 NOTE — Telephone Encounter (Signed)
Pt requesting a call back regarding the status of her referral.

## 2018-03-28 NOTE — Telephone Encounter (Signed)
LMTCB-PT Pt scheduled at [facility] rheumatology department 04/03/18 at 10:00

## 2018-04-03 DIAGNOSIS — H532 Diplopia: Secondary | ICD-10-CM | POA: Insufficient documentation

## 2018-04-03 DIAGNOSIS — M79641 Pain in right hand: Secondary | ICD-10-CM | POA: Insufficient documentation

## 2018-04-03 DIAGNOSIS — R200 Anesthesia of skin: Secondary | ICD-10-CM | POA: Insufficient documentation

## 2018-04-03 DIAGNOSIS — R202 Paresthesia of skin: Secondary | ICD-10-CM | POA: Insufficient documentation

## 2018-04-09 ENCOUNTER — Other Ambulatory Visit: Payer: Self-pay | Admitting: Family Medicine

## 2018-04-09 DIAGNOSIS — I10 Essential (primary) hypertension: Secondary | ICD-10-CM

## 2018-04-12 ENCOUNTER — Other Ambulatory Visit: Payer: Self-pay | Admitting: Family Medicine

## 2018-04-12 DIAGNOSIS — G4700 Insomnia, unspecified: Secondary | ICD-10-CM

## 2018-04-12 MED ORDER — ZOLPIDEM TARTRATE 10 MG PO TABS: 10.0000 mg | ORAL_TABLET | Freq: Every evening | ORAL | 5 refills | Status: DC | PRN

## 2018-04-12 NOTE — Telephone Encounter (Signed)
Please review. Thanks!  

## 2018-04-12 NOTE — Telephone Encounter (Signed)
Needs a refill on  Zolpidem 10 mg  She uses [facility]

## 2018-05-29 ENCOUNTER — Encounter: Payer: Self-pay | Admitting: Family Medicine

## 2018-05-30 DIAGNOSIS — R200 Anesthesia of skin: Secondary | ICD-10-CM | POA: Insufficient documentation

## 2018-05-30 DIAGNOSIS — R202 Paresthesia of skin: Secondary | ICD-10-CM | POA: Insufficient documentation

## 2018-05-31 ENCOUNTER — Other Ambulatory Visit: Payer: Self-pay | Admitting: Neurology

## 2018-05-31 DIAGNOSIS — R200 Anesthesia of skin: Secondary | ICD-10-CM

## 2018-06-05 ENCOUNTER — Ambulatory Visit
Admission: RE | Admit: 2018-06-05 | Discharge: 2018-06-05 | Disposition: A | Discharge status: Home / Self Care | Payer: Managed Care, Other (non HMO) | Source: Ambulatory Visit | Attending: Neurology | Admitting: Neurology

## 2018-06-05 DIAGNOSIS — M79642 Pain in left hand: Secondary | ICD-10-CM | POA: Diagnosis not present

## 2018-06-05 DIAGNOSIS — H532 Diplopia: Secondary | ICD-10-CM | POA: Diagnosis not present

## 2018-06-05 DIAGNOSIS — M79641 Pain in right hand: Secondary | ICD-10-CM | POA: Insufficient documentation

## 2018-06-05 DIAGNOSIS — R202 Paresthesia of skin: Secondary | ICD-10-CM | POA: Diagnosis not present

## 2018-06-05 DIAGNOSIS — R200 Anesthesia of skin: Secondary | ICD-10-CM | POA: Diagnosis not present

## 2018-06-20 ENCOUNTER — Encounter: Payer: Self-pay | Admitting: Obstetrics and Gynecology

## 2018-06-25 ENCOUNTER — Ambulatory Visit (INDEPENDENT_AMBULATORY_CARE_PROVIDER_SITE_OTHER): Payer: Managed Care, Other (non HMO) | Admitting: Family Medicine

## 2018-06-25 ENCOUNTER — Encounter: Payer: Self-pay | Admitting: Family Medicine

## 2018-06-25 VITALS — BP 128/86 | HR 104 | Temp 98.6°F | Ht 64.0 in | Wt 123.0 lb

## 2018-06-25 DIAGNOSIS — Z0000 Encounter for general adult medical examination without abnormal findings: Secondary | ICD-10-CM

## 2018-06-25 DIAGNOSIS — Z23 Encounter for immunization: Secondary | ICD-10-CM

## 2018-06-25 NOTE — Progress Notes (Signed)
Patient: Jill Porter, Female    DOB: 04/02/1966, 52 y.o.   MRN: [id number] Visit Date: 06/27/2018  Today's Provider: Wilhemena Durie, MD   Chief Complaint  Patient presents with  . Annual Exam   Subjective:    Annual physical exam Jill Porter is a 52 y.o. female who presents today for health maintenance and complete physical. She feels fairly well. She reports exercising regularly. She reports she is sleeping poorly.  (Chronic issue.)  -----------------------------------------------------------------   Review of Systems  Constitutional: Negative.   HENT: Negative.   Eyes: Negative.   Respiratory: Negative.   Cardiovascular: Negative.   Gastrointestinal: Negative.   Endocrine: Positive for heat intolerance. Negative for cold intolerance, polydipsia, polyphagia and polyuria.  Genitourinary: Negative.   Musculoskeletal: Positive for arthralgias. Negative for back pain, gait problem, joint swelling, myalgias, neck pain and neck stiffness.  Skin: Negative.   Allergic/Immunologic: Negative.   Neurological: Positive for numbness. Negative for dizziness, tremors, seizures, syncope, facial asymmetry, speech difficulty, weakness, light-headedness and headaches.  Hematological: Negative.   Psychiatric/Behavioral: Negative.     Social History      She  reports that she has never smoked. She has never used smokeless tobacco. She reports that she does not drink alcohol or use drugs.       Social History   Socioeconomic History  . Marital status: Married    Spouse name: Not on file  . Number of children: Not on file  . Years of education: Not on file  . Highest education level: Not on file  Occupational History  . Not on file  Social Needs  . Financial resource strain: Not on file  . Food insecurity:    Worry: Not on file    Inability: Not on file  . Transportation needs:    Medical: Not on file    Non-medical: Not on file  Tobacco Use  . Smoking status:  Never Smoker  . Smokeless tobacco: Never Used  Substance and Sexual Activity  . Alcohol use: No    Alcohol/week: 0.0 standard drinks  . Drug use: No  . Sexual activity: Yes    Birth control/protection: Surgical  Lifestyle  . Physical activity:    Days per week: Not on file    Minutes per session: Not on file  . Stress: Not on file  Relationships  . Social connections:    Talks on phone: Not on file    Gets together: Not on file    Attends religious service: Not on file    Active member of club or organization: Not on file    Attends meetings of clubs or organizations: Not on file    Relationship status: Not on file  Other Topics Concern  . Not on file  Social History Narrative  . Not on file    Past Medical History:  Diagnosis Date  . Cervical dysphagia   . Climacteric   . Hypertension   . Melanoma ([place])   . Motion sickness    anytime moving  . Pelvic adhesions   . Recurrent cystitis      Patient Active Problem List   Diagnosis Date Noted  . Special screening for malignant neoplasms, colon   . Perimenopausal vasomotor symptoms 06/10/2015  . Status post hysterectomy 06/10/2015  . History of cervical dysplasia 06/10/2015  . History of oophorectomy, unilateral 06/10/2015  . Cholelithiasis 03/31/2015  . Melanoma ([place]) 03/31/2015  . Hx of abnormal cervical Pap smear  03/31/2015  . Tinea versicolor 03/31/2015  . Vitamin D deficiency 03/31/2015  . Hyperlipemia 03/31/2015  . Depression 03/31/2015  . Acute anxiety 03/31/2015  . Insomnia 03/31/2015  . Hypertension 03/31/2015  . Plantar fasciitis 03/31/2015  . Frequent UTI 03/31/2015  . Discoid eczema 03/31/2015    Past Surgical History:  Procedure Laterality Date  . ABDOMINAL HYSTERECTOMY  2000   due to pre cancerous cells  . BASAL CELL CARCINOMA EXCISION  2013   removed from forehead and Lt shooulder  . BREAST CYST ASPIRATION  01/2010  . CHOLECYSTECTOMY  06/19/2012  . COLONOSCOPY WITH PROPOFOL N/A 07/06/2015    Procedure: COLONOSCOPY WITH PROPOFOL;  Surgeon: [name], MD;  Location: [facility];  Service: Endoscopy;  Laterality: N/A;  . EXPLORATORY LAPAROTOMY     lysis of adhesions  . LIPOSUCTION    . MELANOMA EXCISION  06/2012   Removed from Rt shoulder  . OVARY SURGERY  1997   hx of removal of ovary and adhesions  . PARATHYROIDECTOMY  04/14/2017  . ROTATOR CUFF REPAIR      Family History        Family Status  Relation Name Status  . Mother  Alive  . Father  Alive  . Other  (Not Specified)       Died from MI  . Brother  Alive  . MGF  Deceased  . Neg Hx  (Not Specified)        Her family history includes Heart attack (age of onset: 31) in her maternal grandfather; Hypertension in her father and mother. There is no history of Cancer, Diabetes, Heart disease, Kidney cancer, or Bladder Cancer.      Allergies  Allergen Reactions  . Sulfa Antibiotics Hives  . Penicillins Itching and Rash     Current Outpatient Medications:  .  gabapentin (NEURONTIN) 100 MG capsule, TAKE ONE CAPSULE BY MOUTH TWICE A DAY FOR 7 DAYS THEN TAKE 2 CAPSULES BY MOUTH TWICE A DAY, Disp: , Rfl: 3 .  hydrochlorothiazide (HYDRODIURIL) 25 MG tablet, TAKE 1 TABLET BY MOUTH DAILY, Disp: 90 tablet, Rfl: 3 .  losartan (COZAAR) 50 MG tablet, TAKE 1 TABLET BY MOUTH EVERY DAY, Disp: 30 tablet, Rfl: 11 .  zolpidem (AMBIEN) 10 MG tablet, Take 1 tablet (10 mg total) by mouth at bedtime as needed for sleep., Disp: 30 tablet, Rfl: 5 .  meloxicam (MOBIC) 15 MG tablet, Take 1 tablet (15 mg total) by mouth daily. (Patient not taking: Reported on 06/25/2018), Disp: 30 tablet, Rfl: 5 .  predniSONE (STERAPRED UNI-PAK 21 TAB) 10 MG (21) TBPK tablet, Take 6 tablets the first day then decrease by one daily until finished., Disp: 21 tablet, Rfl: 0   Patient Care Team: [name]., MD as PCP - General (Family Medicine) Rockey Situ, Kathlene November, MD as Consulting Physician (Cardiology)      Objective:   Vitals: BP  128/86 (BP Location: Left Arm, Patient Position: Sitting, Cuff Size: Normal)   Pulse (!) 104   Temp 98.6 F (37 C) (Oral)   Ht 5\' 4"  (1.626 m)   Wt 123 lb (55.8 kg)   SpO2 99%   BMI 21.11 kg/m    Vitals:   06/25/18 1410  BP: 128/86  Pulse: (!) 104  Temp: 98.6 F (37 C)  TempSrc: Oral  SpO2: 99%  Weight: 123 lb (55.8 kg)  Height: 5\' 4"  (1.626 m)     Physical Exam  Constitutional: She is oriented to person, place, and  time. She appears well-developed and well-nourished.  HENT:  Head: Normocephalic and atraumatic.  Right Ear: External ear normal.  Left Ear: External ear normal.  Nose: Nose normal.  Mouth/Throat: Oropharynx is clear and moist.  Eyes: Pupils are equal, round, and reactive to light. Conjunctivae and EOM are normal.  Neck: Normal range of motion. Neck supple.  Cardiovascular: Normal rate, regular rhythm, normal heart sounds and intact distal pulses.  Pulmonary/Chest: Effort normal and breath sounds normal.  Abdominal: Soft. Bowel sounds are normal.  Musculoskeletal: Normal range of motion.  Neurological: She is alert and oriented to person, place, and time.  Skin: Skin is warm and dry.  Tanned skin.  Psychiatric: She has a normal mood and affect. Her behavior is normal. Judgment and thought content normal.     Depression Screen PHQ 2/9 Scores 06/27/2018 11/28/2017 12/14/2016 11/29/2016  PHQ - 2 Score 1 0 5 3  PHQ- 9 Score 6 3 11 10       Assessment & Plan:     Routine Health Maintenance and Physical Exam  Exercise Activities and Dietary recommendations Goals   None     Immunization History  Administered Date(s) Administered  . Influenza Inj Mdck Quad Pf 07/20/2017  . Influenza,inj,Quad PF,6+ Mos 06/10/2015, 06/25/2018  . Influenza-Unspecified 12/01/2017  . Tdap 03/18/2013  . Zoster Recombinat (Shingrix) 09/23/2017, 03/01/2018    Health Maintenance  Topic Date Due  . PAP SMEAR  06/03/2018  . MAMMOGRAM  06/20/2020  . TETANUS/TDAP   03/19/2023  . COLONOSCOPY  07/05/2025  . INFLUENZA VACCINE  Completed  . HIV Screening  Completed    Gynecology October 2. Discussed health benefits of physical activity, and encouraged her to engage in regular exercise appropriate for her age and condition. H/o Melanoma Bilateral CTS ENG tomorrow by Dr Melrose Nakayama.   --------------------------------------------------------------------   I have done the exam and reviewed the above chart and it is accurate to the best of my knowledge. Development worker, community has been used in this note in any air is in the dictation or transcription are unintentional.  Wilhemena Durie, MD  [place]

## 2018-06-26 DIAGNOSIS — R29898 Other symptoms and signs involving the musculoskeletal system: Secondary | ICD-10-CM | POA: Insufficient documentation

## 2018-06-28 NOTE — Progress Notes (Signed)
Patient ID: Jill Porter, female   DOB: 1966-09-30, 52 y.o.   MRN: [id number] ANNUAL PREVENTATIVE CARE GYN  ENCOUNTER NOTE  Subjective:       Jill Porter is a 52 y.o. G43P4 female here for a routine annual gynecologic exam.  Current complaints: Jill Porter presents today for gynecologic physical.  She has not been seen by me for 3 years.  Major interval health issues include surgery for parathyroid condition-reportedly normal at this time. Jill Porter is exercising daily. Bowel and bladder function are normal. She does report vaginal dryness with intercourse.  Currently she is not on any ERT therapy. Jill Porter is status post TAH for severe dysplasia in 2000.   Gynecologic History No LMP recorded. Patient has had a hysterectomy.TAH in 2000 for severe dysplasia Contraception: status post hysterectomyTAH Last Pap: pap w rflx- neg. Results were: normal Last mammogram: 06/2018  wnl . Results were: normal History of severe dysplasia with last abnormal Pap smear approximately 2010; we'll recommend repeat Pap smear testing until 2030 (20 years post-last abnormal Pap smear)  Obstetric History OB History  Gravida Para Term Preterm AB Living  4 4          SAB TAB Ectopic Multiple Live Births               # Outcome Date GA Lbr Len/2nd Weight Sex Delivery Anes PTL Lv  4 Para           3 Para           2 Para           1 Para             Past Medical History:  Diagnosis Date  . Cervical dysphagia   . Climacteric   . Hypertension   . Melanoma ([place])   . Motion sickness    anytime moving  . Pelvic adhesions   . Recurrent cystitis     Past Surgical History:  Procedure Laterality Date  . ABDOMINAL HYSTERECTOMY  2000   due to pre cancerous cells  . BASAL CELL CARCINOMA EXCISION  2013   removed from forehead and Lt shooulder  . BREAST CYST ASPIRATION  01/2010  . CHOLECYSTECTOMY  06/19/2012  . COLONOSCOPY WITH PROPOFOL N/A 07/06/2015   Procedure: COLONOSCOPY WITH PROPOFOL;  Surgeon: [name],  MD;  Location: [facility];  Service: Endoscopy;  Laterality: N/A;  . EXPLORATORY LAPAROTOMY     lysis of adhesions  . LIPOSUCTION    . MELANOMA EXCISION  06/2012   Removed from Rt shoulder  . OVARY SURGERY  1997   hx of removal of ovary and adhesions  . PARATHYROIDECTOMY  04/14/2017  . ROTATOR CUFF REPAIR      Current Outpatient Medications on File Prior to Visit  Medication Sig Dispense Refill  . gabapentin (NEURONTIN) 100 MG capsule TAKE ONE CAPSULE BY MOUTH TWICE A DAY FOR 7 DAYS THEN TAKE 2 CAPSULES BY MOUTH TWICE A DAY  3  . hydrochlorothiazide (HYDRODIURIL) 25 MG tablet TAKE 1 TABLET BY MOUTH DAILY 90 tablet 3  . losartan (COZAAR) 50 MG tablet TAKE 1 TABLET BY MOUTH EVERY DAY 30 tablet 11  . meloxicam (MOBIC) 15 MG tablet Take 1 tablet (15 mg total) by mouth daily. (Patient not taking: Reported on 06/25/2018) 30 tablet 5  . predniSONE (STERAPRED UNI-PAK 21 TAB) 10 MG (21) TBPK tablet Take 6 tablets the first day then decrease by one daily until finished. 21 tablet 0  .  zolpidem (AMBIEN) 10 MG tablet Take 1 tablet (10 mg total) by mouth at bedtime as needed for sleep. 30 tablet 5   No current facility-administered medications on file prior to visit.     Allergies  Allergen Reactions  . Sulfa Antibiotics Hives  . Penicillins Itching and Rash    Social History   Socioeconomic History  . Marital status: Married    Spouse name: Not on file  . Number of children: Not on file  . Years of education: Not on file  . Highest education level: Not on file  Occupational History  . Not on file  Social Needs  . Financial resource strain: Not on file  . Food insecurity:    Worry: Not on file    Inability: Not on file  . Transportation needs:    Medical: Not on file    Non-medical: Not on file  Tobacco Use  . Smoking status: Never Smoker  . Smokeless tobacco: Never Used  Substance and Sexual Activity  . Alcohol use: No    Alcohol/week: 0.0 standard drinks  . Drug  use: No  . Sexual activity: Yes    Birth control/protection: Surgical  Lifestyle  . Physical activity:    Days per week: Not on file    Minutes per session: Not on file  . Stress: Not on file  Relationships  . Social connections:    Talks on phone: Not on file    Gets together: Not on file    Attends religious service: Not on file    Active member of club or organization: Not on file    Attends meetings of clubs or organizations: Not on file    Relationship status: Not on file  . Intimate partner violence:    Fear of current or ex partner: Not on file    Emotionally abused: Not on file    Physically abused: Not on file    Forced sexual activity: Not on file  Other Topics Concern  . Not on file  Social History Narrative  . Not on file    Family History  Problem Relation Age of Onset  . Hypertension Mother   . Hypertension Father   . Heart attack Maternal Grandfather 52       MI  . Cancer Neg Hx   . Diabetes Neg Hx   . Heart disease Neg Hx   . Kidney cancer Neg Hx   . Bladder Cancer Neg Hx     The following portions of the patient's history were reviewed and updated as appropriate: allergies, current medications, past family history, past medical history, past social history, past surgical history and problem list.  Review of Systems Review of Systems  Constitutional:       Mild vasomotor symptoms  HENT: Negative.   Eyes: Negative.   Respiratory: Negative.   Cardiovascular: Negative.   Genitourinary: Negative.   Musculoskeletal: Negative.   Skin: Negative.   Neurological: Negative.   Endo/Heme/Allergies: Negative.   Psychiatric/Behavioral: Negative.      Objective:   BP (!) 148/89   Pulse (!) 101   Ht 5\' 4"  (1.626 m)   Wt 124 lb 4.8 oz (56.4 kg)   BMI 21.34 kg/m  CONSTITUTIONAL: Well-developed, well-nourished female in no acute distress.  PSYCHIATRIC: Normal mood and affect. Normal behavior. Normal judgment and thought content. [place]: Alert and  oriented to person, place, and time. Normal muscle tone coordination. No cranial nerve deficit noted. HENT:  Normocephalic, atraumatic, External right and  left ear normal.  EYES: Conjunctivae and EOM are normal.  No scleral icterus.  NECK: Normal range of motion, supple, no masses.  Normal thyroid.  SKIN: Skin is warm and dry. No rash noted. Not diaphoretic. No erythema. No pallor. CARDIOVASCULAR: Normal heart rate noted, regular rhythm, no murmur. RESPIRATORY: Clear to auscultation bilaterally. Effort and breath sounds normal, no problems with respiration noted. BREASTS: Symmetric in size. No masses, skin changes, nipple drainage, or lymphadenopathy.breasts implants are present with well-healed surgical scars ABDOMEN: Soft, no distention noted.  No tenderness, rebound or guarding. Midline incision is well-healed along with a well-healed abdominoplasty scar (transverse) BLADDER: Normal PELVIC:  External Genitalia: Normal  BUS: Normal  Vagina: Moderate atrophy; good vaginal vault support; single digit exam performed  Cervix: surgically absent  Uterus: surgically absent  Adnexa: Normal; nonpalpable, nontender  RV: External Exam NormaI, No Rectal Masses and Normal Sphincter tone  MUSCULOSKELETAL: Normal range of motion. No tenderness.  No cyanosis, clubbing, or edema.  2+ distal pulses. LYMPHATIC: No Axillary, Supraclavicular, or Inguinal Adenopathy.    Assessment:   Annual gynecologic examination 52 y.o. Contraception: status post hysterectomy Normal BMI Vasomotor symptoms, mild, not desiring systemic estrogen replacement therapy Vaginal atrophy, mildly symptomatic History of breast augmentation and abdominoplasty History of TAH and LSO  Plan:  Pap: pap w/rflx Mammogram: UTD- 06/2018 [facility] wnl Stool Guaiac Testing:  Ordered - colonoscopy- 2016- wnl Labs:Per pcp Routine preventative health maintenance measures emphasized: Exercise/Diet/Weight control, Tobacco Warnings and  Alcohol/Substance use risks Calcium with vitamin D twice a day encouraged Premarin cream 1/2 to 1 g intravaginal twice a week Return to [place]  am acting as a Education administrator for [employer].  I have reviewed and concur with documentation of Joyice Faster, CMA Brayton Mars, MD   Note: This dictation was prepared with Dragon dictation along with smaller phrase technology. Any transcriptional errors that result from this process are unintentional.

## 2018-07-04 ENCOUNTER — Encounter: Payer: Self-pay | Admitting: Obstetrics and Gynecology

## 2018-07-04 ENCOUNTER — Other Ambulatory Visit (HOSPITAL_COMMUNITY)
Admission: RE | Admit: 2018-07-04 | Discharge: 2018-07-04 | Disposition: A | Discharge status: Home / Self Care | Payer: Managed Care, Other (non HMO) | Source: Ambulatory Visit | Attending: Obstetrics and Gynecology | Admitting: Obstetrics and Gynecology

## 2018-07-04 ENCOUNTER — Ambulatory Visit (INDEPENDENT_AMBULATORY_CARE_PROVIDER_SITE_OTHER): Payer: Managed Care, Other (non HMO) | Admitting: Obstetrics and Gynecology

## 2018-07-04 VITALS — BP 148/89 | HR 101 | Ht 64.0 in | Wt 124.3 lb

## 2018-07-04 DIAGNOSIS — Z87410 Personal history of cervical dysplasia: Secondary | ICD-10-CM | POA: Diagnosis not present

## 2018-07-04 DIAGNOSIS — Z1211 Encounter for screening for malignant neoplasm of colon: Secondary | ICD-10-CM | POA: Diagnosis not present

## 2018-07-04 DIAGNOSIS — Z01419 Encounter for gynecological examination (general) (routine) without abnormal findings: Secondary | ICD-10-CM | POA: Diagnosis not present

## 2018-07-04 DIAGNOSIS — Z90710 Acquired absence of both cervix and uterus: Secondary | ICD-10-CM | POA: Diagnosis not present

## 2018-07-04 DIAGNOSIS — N952 Postmenopausal atrophic vaginitis: Secondary | ICD-10-CM

## 2018-07-04 DIAGNOSIS — Z90721 Acquired absence of ovaries, unilateral: Secondary | ICD-10-CM

## 2018-07-04 NOTE — Patient Instructions (Addendum)
1.  Pap smear is done. 2.  Mammogram has already been done this year. 3.  Screening stool guaiac cards for colon cancer given. 4.  Screening labs are to be obtained today. 5.  Trial of Premarin cream 1/2 g intravaginal twice weekly as recommended for vaginal atrophy 6.  Calcium 600 mg twice a day and vitamin D 400 international units twice a day as recommended for osteoporosis prevention. 7.  Continue with healthy eating and exercise daily. 8.  Lubricant options include:  Virgin olive oil  Coconut oil  Astroglide  Jo H2O lubricant 8.  Return in 1 year for annual exam-patient to be seen by Dr. [name]   Health Maintenance for Postmenopausal Women Menopause is a normal process in which your reproductive ability comes to an end. This process happens gradually over a span of months to years, usually between the ages of 71 and 45. Menopause is complete when you have missed 12 consecutive menstrual periods. It is important to talk with your health care provider about some of the most common conditions that affect postmenopausal women, such as heart disease, cancer, and bone loss (osteoporosis). Adopting a healthy lifestyle and getting preventive care can help to promote your health and wellness. Those actions can also lower your chances of developing some of these common conditions. What should I know about menopause? During menopause, you may experience a number of symptoms, such as:  Moderate-to-severe hot flashes.  Night sweats.  Decrease in sex drive.  Mood swings.  Headaches.  Tiredness.  Irritability.  Memory problems.  Insomnia.  Choosing to treat or not to treat menopausal changes is an individual decision that you make with your health care provider. What should I know about hormone replacement therapy and supplements? Hormone therapy products are effective for treating symptoms that are associated with menopause, such as hot flashes and night sweats. Hormone replacement  carries certain risks, especially as you become older. If you are thinking about using estrogen or estrogen with progestin treatments, discuss the benefits and risks with your health care provider. What should I know about heart disease and stroke? Heart disease, heart attack, and stroke become more likely as you age. This may be due, in part, to the hormonal changes that your body experiences during menopause. These can affect how your body processes dietary fats, triglycerides, and cholesterol. Heart attack and stroke are both medical emergencies. There are many things that you can do to help prevent heart disease and stroke:  Have your blood pressure checked at least every 1-2 years. High blood pressure causes heart disease and increases the risk of stroke.  If you are 87-46 years old, ask your health care provider if you should take aspirin to prevent a heart attack or a stroke.  Do not use any tobacco products, including cigarettes, chewing tobacco, or electronic cigarettes. If you need help quitting, ask your health care provider.  It is important to eat a healthy diet and maintain a healthy weight. ? Be sure to include plenty of vegetables, fruits, low-fat dairy products, and lean protein. ? Avoid eating foods that are high in solid fats, added sugars, or salt (sodium).  Get regular exercise. This is one of the most important things that you can do for your health. ? Try to exercise for at least 150 minutes each week. The type of exercise that you do should increase your heart rate and make you sweat. This is known as moderate-intensity exercise. ? Try to do strengthening exercises at  least twice each week. Do these in addition to the moderate-intensity exercise.  Know your numbers.Ask your health care provider to check your cholesterol and your blood glucose. Continue to have your blood tested as directed by your health care provider.  What should I know about cancer screening? There  are several types of cancer. Take the following steps to reduce your risk and to catch any cancer development as early as possible. Breast Cancer  Practice breast self-awareness. ? This means understanding how your breasts normally appear and feel. ? It also means doing regular breast self-exams. Let your health care provider know about any changes, no matter how small.  If you are 54 or older, have a clinician do a breast exam (clinical breast exam or CBE) every year. Depending on your age, family history, and medical history, it may be recommended that you also have a yearly breast X-ray (mammogram).  If you have a family history of breast cancer, talk with your health care provider about genetic screening.  If you are at high risk for breast cancer, talk with your health care provider about having an MRI and a mammogram every year.  Breast cancer (BRCA) gene test is recommended for women who have family members with BRCA-related cancers. Results of the assessment will determine the need for genetic counseling and BRCA1 and for BRCA2 testing. BRCA-related cancers include these types: ? Breast. This occurs in males or females. ? Ovarian. ? Tubal. This may also be called fallopian tube cancer. ? Cancer of the abdominal or pelvic lining (peritoneal cancer). ? Prostate. ? Pancreatic.  Cervical, Uterine, and Ovarian Cancer Your health care provider may recommend that you be screened regularly for cancer of the pelvic organs. These include your ovaries, uterus, and vagina. This screening involves a pelvic exam, which includes checking for microscopic changes to the surface of your cervix (Pap test).  For women ages 21-65, health care providers may recommend a pelvic exam and a Pap test every three years. For women ages 67-65, they may recommend the Pap test and pelvic exam, combined with testing for human papilloma virus (HPV), every five years. Some types of HPV increase your risk of cervical  cancer. Testing for HPV may also be done on women of any age who have unclear Pap test results.  Other health care providers may not recommend any screening for nonpregnant women who are considered low risk for pelvic cancer and have no symptoms. Ask your health care provider if a screening pelvic exam is right for you.  If you have had past treatment for cervical cancer or a condition that could lead to cancer, you need Pap tests and screening for cancer for at least 20 years after your treatment. If Pap tests have been discontinued for you, your risk factors (such as having a new sexual partner) need to be reassessed to determine if you should start having screenings again. Some women have medical problems that increase the chance of getting cervical cancer. In these cases, your health care provider may recommend that you have screening and Pap tests more often.  If you have a family history of uterine cancer or ovarian cancer, talk with your health care provider about genetic screening.  If you have vaginal bleeding after reaching menopause, tell your health care provider.  There are currently no reliable tests available to screen for ovarian cancer.  Lung Cancer Lung cancer screening is recommended for adults 37-66 years old who are at high risk for lung cancer  because of a history of smoking. A yearly low-dose CT scan of the lungs is recommended if you:  Currently smoke.  Have a history of at least 30 pack-years of smoking and you currently smoke or have quit within the past 15 years. A pack-year is smoking an average of one pack of cigarettes per day for one year.  Yearly screening should:  Continue until it has been 15 years since you quit.  Stop if you develop a health problem that would prevent you from having lung cancer treatment.  Colorectal Cancer  This type of cancer can be detected and can often be prevented.  Routine colorectal cancer screening usually begins at age 82  and continues through age 37.  If you have risk factors for colon cancer, your health care provider may recommend that you be screened at an earlier age.  If you have a family history of colorectal cancer, talk with your health care provider about genetic screening.  Your health care provider may also recommend using home test kits to check for hidden blood in your stool.  A small camera at the end of a tube can be used to examine your colon directly (sigmoidoscopy or colonoscopy). This is done to check for the earliest forms of colorectal cancer.  Direct examination of the colon should be repeated every 5-10 years until age 41. However, if early forms of precancerous polyps or small growths are found or if you have a family history or genetic risk for colorectal cancer, you may need to be screened more often.  Skin Cancer  Check your skin from head to toe regularly.  Monitor any moles. Be sure to tell your health care provider: ? About any new moles or changes in moles, especially if there is a change in a mole's shape or color. ? If you have a mole that is larger than the size of a pencil eraser.  If any of your family members has a history of skin cancer, especially at a young age, talk with your health care provider about genetic screening.  Always use sunscreen. Apply sunscreen liberally and repeatedly throughout the day.  Whenever you are outside, protect yourself by wearing long sleeves, pants, a wide-brimmed hat, and sunglasses.  What should I know about osteoporosis? Osteoporosis is a condition in which bone destruction happens more quickly than new bone creation. After menopause, you may be at an increased risk for osteoporosis. To help prevent osteoporosis or the bone fractures that can happen because of osteoporosis, the following is recommended:  If you are 40-16 years old, get at least 1,000 mg of calcium and at least 600 mg of vitamin D per day.  If you are older than  age 86 but younger than age 73, get at least 1,200 mg of calcium and at least 600 mg of vitamin D per day.  If you are older than age 58, get at least 1,200 mg of calcium and at least 800 mg of vitamin D per day.  Smoking and excessive alcohol intake increase the risk of osteoporosis. Eat foods that are rich in calcium and vitamin D, and do weight-bearing exercises several times each week as directed by your health care provider. What should I know about how menopause affects my mental health? Depression may occur at any age, but it is more common as you become older. Common symptoms of depression include:  Low or sad mood.  Changes in sleep patterns.  Changes in appetite or eating patterns.  Feeling an overall lack of motivation or enjoyment of activities that you previously enjoyed.  Frequent crying spells.  Talk with your health care provider if you think that you are experiencing depression. What should I know about immunizations? It is important that you get and maintain your immunizations. These include:  Tetanus, diphtheria, and pertussis (Tdap) booster vaccine.  Influenza every year before the flu season begins.  Pneumonia vaccine.  Shingles vaccine.  Your health care provider may also recommend other immunizations. This information is not intended to replace advice given to you by your health care provider. Make sure you discuss any questions you have with your health care provider. Document Released: 11/11/2005 Document Revised: 04/08/2016 Document Reviewed: 06/23/2015 Elsevier Interactive Patient Education  2018 [employer].

## 2018-07-05 LAB — COMPREHENSIVE METABOLIC PANEL
ALT: 14 IU/L (ref 0–32)
AST: 17 IU/L (ref 0–40)
Albumin/Globulin Ratio: 1.5 (ref 1.2–2.2)
Albumin: 4.4 g/dL (ref 3.5–5.5)
Alkaline Phosphatase: 68 IU/L (ref 39–117)
BUN/Creatinine Ratio: 10 (ref 9–23)
BUN: 10 mg/dL (ref 6–24)
Bilirubin Total: 0.3 mg/dL (ref 0.0–1.2)
CO2: 28 mmol/L (ref 20–29)
Calcium: 9.6 mg/dL (ref 8.7–10.2)
Chloride: 96 mmol/L (ref 96–106)
Creatinine, Ser: 1.04 mg/dL — ABNORMAL HIGH (ref 0.57–1.00)
GFR calc Af Amer: 71 mL/min/{1.73_m2} (ref >59)
GFR calc non Af Amer: 61 mL/min/{1.73_m2} (ref >59)
Globulin, Total: 2.9 g/dL (ref 1.5–4.5)
Glucose: 88 mg/dL (ref 65–99)
Potassium: 3.6 mmol/L (ref 3.5–5.2)
Sodium: 141 mmol/L (ref 134–144)
Total Protein: 7.3 g/dL (ref 6.0–8.5)

## 2018-07-05 LAB — CBC WITH DIFFERENTIAL/PLATELET
Basophils Absolute: 0 10*3/uL (ref 0.0–0.2)
Basos: 1 %
EOS (ABSOLUTE): 0.1 10*3/uL (ref 0.0–0.4)
Eos: 2 %
Hematocrit: 39.3 % (ref 34.0–46.6)
Hemoglobin: 13 g/dL (ref 11.1–15.9)
Immature Grans (Abs): 0 10*3/uL (ref 0.0–0.1)
Immature Granulocytes: 0 %
Lymphocytes Absolute: 2.2 10*3/uL (ref 0.7–3.1)
Lymphs: 42 %
MCH: 28 pg (ref 26.6–33.0)
MCHC: 33.1 g/dL (ref 31.5–35.7)
MCV: 85 fL (ref 79–97)
Monocytes Absolute: 0.5 10*3/uL (ref 0.1–0.9)
Monocytes: 9 %
Neutrophils Absolute: 2.5 10*3/uL (ref 1.4–7.0)
Neutrophils: 46 %
Platelets: 283 10*3/uL (ref 150–450)
RBC: 4.65 x10E6/uL (ref 3.77–5.28)
RDW: 13.1 % (ref 12.3–15.4)
WBC: 5.3 10*3/uL (ref 3.4–10.8)

## 2018-07-05 LAB — LIPID PANEL
Chol/HDL Ratio: 5.1 ratio — ABNORMAL HIGH (ref 0.0–4.4)
Cholesterol, Total: 270 mg/dL — ABNORMAL HIGH (ref 100–199)
HDL: 53 mg/dL (ref >39)
LDL Calculated: 171 mg/dL — ABNORMAL HIGH (ref 0–99)
Triglycerides: 230 mg/dL — ABNORMAL HIGH (ref 0–149)
VLDL Cholesterol Cal: 46 mg/dL — ABNORMAL HIGH (ref 5–40)

## 2018-07-05 LAB — TSH: TSH: 0.971 u[IU]/mL (ref 0.450–4.500)

## 2018-07-09 LAB — CYTOLOGY - PAP
Diagnosis: NEGATIVE
HPV: NOT DETECTED

## 2018-09-04 ENCOUNTER — Other Ambulatory Visit: Payer: Self-pay | Admitting: Family Medicine

## 2018-09-04 DIAGNOSIS — M79641 Pain in right hand: Secondary | ICD-10-CM

## 2018-09-04 DIAGNOSIS — M79642 Pain in left hand: Principal | ICD-10-CM

## 2018-09-04 DIAGNOSIS — M79673 Pain in unspecified foot: Secondary | ICD-10-CM

## 2018-09-04 DIAGNOSIS — M25539 Pain in unspecified wrist: Secondary | ICD-10-CM

## 2018-10-01 ENCOUNTER — Other Ambulatory Visit: Payer: Self-pay | Admitting: Family Medicine

## 2018-10-08 ENCOUNTER — Other Ambulatory Visit: Payer: Self-pay | Admitting: Family Medicine

## 2018-10-08 DIAGNOSIS — G4700 Insomnia, unspecified: Secondary | ICD-10-CM

## 2018-10-08 NOTE — Telephone Encounter (Signed)
Pt requesting refill of zolpidem (AMBIEN) 10 MG tablet sent to [facility] [address]

## 2018-10-08 NOTE — Telephone Encounter (Signed)
Please review. Thanks!  

## 2018-10-09 ENCOUNTER — Other Ambulatory Visit: Payer: Self-pay | Admitting: Family Medicine

## 2018-10-09 DIAGNOSIS — G4700 Insomnia, unspecified: Secondary | ICD-10-CM

## 2018-10-09 MED ORDER — ZOLPIDEM TARTRATE 10 MG PO TABS: 10.0000 mg | ORAL_TABLET | Freq: Every evening | ORAL | 5 refills | Status: DC | PRN

## 2018-12-07 DIAGNOSIS — C44612 Basal cell carcinoma of skin of right upper limb, including shoulder: Secondary | ICD-10-CM | POA: Diagnosis not present

## 2018-12-07 DIAGNOSIS — L570 Actinic keratosis: Secondary | ICD-10-CM | POA: Diagnosis not present

## 2018-12-07 DIAGNOSIS — X32XXXA Exposure to sunlight, initial encounter: Secondary | ICD-10-CM | POA: Diagnosis not present

## 2018-12-07 DIAGNOSIS — D485 Neoplasm of uncertain behavior of skin: Secondary | ICD-10-CM | POA: Diagnosis not present

## 2018-12-07 DIAGNOSIS — Z85820 Personal history of malignant melanoma of skin: Secondary | ICD-10-CM | POA: Diagnosis not present

## 2018-12-07 DIAGNOSIS — Z85828 Personal history of other malignant neoplasm of skin: Secondary | ICD-10-CM | POA: Diagnosis not present

## 2018-12-07 DIAGNOSIS — L905 Scar conditions and fibrosis of skin: Secondary | ICD-10-CM | POA: Diagnosis not present

## 2018-12-07 DIAGNOSIS — Z08 Encounter for follow-up examination after completed treatment for malignant neoplasm: Secondary | ICD-10-CM | POA: Diagnosis not present

## 2018-12-19 DIAGNOSIS — C44612 Basal cell carcinoma of skin of right upper limb, including shoulder: Secondary | ICD-10-CM | POA: Diagnosis not present

## 2018-12-26 ENCOUNTER — Ambulatory Visit: Payer: Managed Care, Other (non HMO) | Admitting: Family Medicine

## 2018-12-26 ENCOUNTER — Ambulatory Visit: Payer: 59 | Admitting: Family Medicine

## 2018-12-26 ENCOUNTER — Other Ambulatory Visit: Payer: Self-pay

## 2019-02-19 ENCOUNTER — Ambulatory Visit: Payer: Self-pay | Admitting: Family Medicine

## 2019-03-12 ENCOUNTER — Other Ambulatory Visit: Payer: Self-pay

## 2019-03-12 ENCOUNTER — Encounter: Payer: Self-pay | Admitting: Family Medicine

## 2019-03-12 ENCOUNTER — Ambulatory Visit (INDEPENDENT_AMBULATORY_CARE_PROVIDER_SITE_OTHER): Payer: 59 | Admitting: Family Medicine

## 2019-03-12 VITALS — BP 126/78 | HR 96 | Temp 98.3°F | Resp 16 | Ht 64.0 in | Wt 129.0 lb

## 2019-03-12 DIAGNOSIS — I10 Essential (primary) hypertension: Secondary | ICD-10-CM

## 2019-03-12 DIAGNOSIS — G4700 Insomnia, unspecified: Secondary | ICD-10-CM

## 2019-03-12 DIAGNOSIS — E559 Vitamin D deficiency, unspecified: Secondary | ICD-10-CM | POA: Diagnosis not present

## 2019-03-12 MED ORDER — ZOLPIDEM TARTRATE 10 MG PO TABS: 10.0000 mg | ORAL_TABLET | Freq: Every evening | ORAL | 5 refills | Status: DC | PRN

## 2019-03-12 NOTE — Progress Notes (Signed)
Patient: Jill Porter Female    DOB: [birth year]   53 y.o.   MRN: [id number] Visit Date: 03/12/2019  Today's Provider: Wilhemena Durie, MD   Chief Complaint  Patient presents with  . Hypertension  . Sore Throat   Subjective:     HPI  Hypertension, follow-up:  BP Readings from Last 3 Encounters:  03/12/19 126/78  07/04/18 (!) 148/89  06/25/18 128/86    She was last seen for hypertension 6 months ago.  BP at that visit was 128/86. Management since that visit includes no changes. She reports good compliance with treatment. She is not having side effects.  She is exercising. She is adherent to low salt diet.   Outside blood pressures are not being checked. She is experiencing none.  Patient denies exertional chest pressure/discomfort, lower extremity edema and palpitations.    Weight trend: stable Wt Readings from Last 3 Encounters:  03/12/19 129 lb (58.5 kg)  07/04/18 124 lb 4.8 oz (56.4 kg)  06/25/18 123 lb (55.8 kg)   She continues to be stressed by sons decisions and health issues.  Allergies  Allergen Reactions  . Sulfa Antibiotics Hives  . Penicillins Itching and Rash     Current Outpatient Medications:  .  gabapentin (NEURONTIN) 100 MG capsule, TAKE ONE CAPSULE BY MOUTH TWICE A DAY FOR 7 DAYS THEN TAKE 2 CAPSULES BY MOUTH TWICE A DAY, Disp: , Rfl: 3 .  hydrochlorothiazide (HYDRODIURIL) 25 MG tablet, TAKE 1 TABLET BY MOUTH DAILY, Disp: 30 tablet, Rfl: 11 .  losartan (COZAAR) 50 MG tablet, TAKE 1 TABLET BY MOUTH EVERY DAY, Disp: 30 tablet, Rfl: 11 .  meloxicam (MOBIC) 15 MG tablet, TAKE 1 TABLET BY MOUTH EVERY DAY, Disp: 30 tablet, Rfl: 5 .  zolpidem (AMBIEN) 10 MG tablet, Take 1 tablet (10 mg total) by mouth at bedtime as needed for sleep., Disp: 30 tablet, Rfl: 5  Review of Systems  Constitutional: Negative for activity change, appetite change, chills, diaphoresis, fatigue, fever and unexpected weight change.  HENT: Positive for postnasal  drip, rhinorrhea, sore throat and trouble swallowing. Negative for voice change.   Respiratory: Negative for cough and shortness of breath.   Cardiovascular: Negative for chest pain, palpitations and leg swelling.  Musculoskeletal: Negative for arthralgias and myalgias.  Allergic/Immunologic: Positive for environmental allergies.  Neurological: Negative for dizziness, light-headedness and headaches.  Psychiatric/Behavioral: Negative.  Negative for agitation, self-injury, sleep disturbance and suicidal ideas. The patient is not nervous/anxious.     Social History   Tobacco Use  . Smoking status: Never Smoker  . Smokeless tobacco: Never Used  Substance Use Topics  . Alcohol use: No    Alcohol/week: 0.0 standard drinks      Objective:   BP 126/78   Pulse 96   Temp 98.3 F (36.8 C)   Resp 16   Ht 5\' 4"  (1.626 m)   Wt 129 lb (58.5 kg)   SpO2 100%   BMI 22.14 kg/m  Vitals:   03/12/19 1426  BP: 126/78  Pulse: 96  Resp: 16  Temp: 98.3 F (36.8 C)  SpO2: 100%  Weight: 129 lb (58.5 kg)  Height: 5\' 4"  (1.626 m)     Physical Exam Vitals signs reviewed.  Constitutional:      Appearance: She is well-developed.  HENT:     Head: Normocephalic and atraumatic.  Eyes:     General: No scleral icterus.    Conjunctiva/sclera: Conjunctivae normal.  Pupils: Pupils are equal, round, and reactive to light.  Neck:     Musculoskeletal: Normal range of motion and neck supple.  Cardiovascular:     Rate and Rhythm: Normal rate and regular rhythm.     Heart sounds: Normal heart sounds.  Pulmonary:     Effort: Pulmonary effort is normal.     Breath sounds: Normal breath sounds.  Abdominal:     Palpations: Abdomen is soft.  Musculoskeletal: Normal range of motion.  Lymphadenopathy:     Cervical: No cervical adenopathy.  Skin:    General: Skin is warm and dry.  Neurological:     Mental Status: She is alert and oriented to person, place, and time.     Deep Tendon Reflexes:  Reflexes are normal and symmetric.  Psychiatric:        Behavior: Behavior normal.        Thought Content: Thought content normal.        Judgment: Judgment normal.         Assessment & Plan    1. Insomnia, unspecified type Refill Ambien. - zolpidem (AMBIEN) 10 MG tablet; Take 1 tablet (10 mg total) by mouth at bedtime as needed for sleep.  Dispense: 30 tablet; Refill: 5    I have done the exam and reviewed the above chart and it is accurate to the best of my knowledge. Development worker, community has been used in this note in any air is in the dictation or transcription are unintentional.  Wilhemena Durie, MD  [place]

## 2019-03-21 ENCOUNTER — Other Ambulatory Visit: Payer: Self-pay | Admitting: Family Medicine

## 2019-03-21 DIAGNOSIS — M79673 Pain in unspecified foot: Secondary | ICD-10-CM

## 2019-03-21 DIAGNOSIS — M79642 Pain in left hand: Secondary | ICD-10-CM

## 2019-03-21 DIAGNOSIS — M25539 Pain in unspecified wrist: Secondary | ICD-10-CM

## 2019-03-21 DIAGNOSIS — M79641 Pain in right hand: Secondary | ICD-10-CM

## 2019-04-17 ENCOUNTER — Encounter: Payer: Self-pay | Admitting: Family Medicine

## 2019-04-19 ENCOUNTER — Telehealth: Payer: Self-pay

## 2019-04-19 NOTE — Telephone Encounter (Signed)
Patient of Dr. Rosanna Randy, patient  states that her chiropractor is seen her for hip/leg pain and advised her to contact Dr. Rosanna Randy for a muscle relaxer medication. Patient states she send Dr. Rosanna Randy a mychart message on Wednesday (04/17/2019)  and has not heard anything back from him yet. L.O.V. was 03/12/2019, please advise.

## 2019-04-22 MED ORDER — CYCLOBENZAPRINE HCL 10 MG PO TABS: 10.0000 mg | ORAL_TABLET | Freq: Three times a day (TID) | ORAL | 1 refills | Status: DC | PRN

## 2019-04-22 NOTE — Telephone Encounter (Signed)
Medication was sent into the pharmacy.  

## 2019-04-22 NOTE — Telephone Encounter (Signed)
Pt is calling back regarding her muscle relaxer.  Please call her back asap.  Thanks, [employer]

## 2019-04-22 NOTE — Telephone Encounter (Signed)
Please review. Thanks!  

## 2019-04-22 NOTE — Telephone Encounter (Signed)
Flexeril 10mg  TID prn,#45,1rf.

## 2019-04-23 ENCOUNTER — Other Ambulatory Visit: Payer: Self-pay

## 2019-04-23 ENCOUNTER — Ambulatory Visit
Admission: RE | Admit: 2019-04-23 | Discharge: 2019-04-23 | Disposition: A | Discharge status: Home / Self Care | Payer: 59 | Source: Ambulatory Visit | Attending: Family Medicine | Admitting: Family Medicine

## 2019-04-23 ENCOUNTER — Ambulatory Visit: Payer: 59 | Admitting: Family Medicine

## 2019-04-23 ENCOUNTER — Encounter: Payer: Self-pay | Admitting: Family Medicine

## 2019-04-23 ENCOUNTER — Ambulatory Visit (INDEPENDENT_AMBULATORY_CARE_PROVIDER_SITE_OTHER): Payer: 59 | Admitting: Family Medicine

## 2019-04-23 VITALS — BP 142/80 | HR 102 | Temp 99.0°F | Resp 16 | Ht 64.0 in | Wt 129.0 lb

## 2019-04-23 DIAGNOSIS — M25551 Pain in right hip: Secondary | ICD-10-CM

## 2019-04-23 DIAGNOSIS — M5431 Sciatica, right side: Secondary | ICD-10-CM

## 2019-04-23 MED ORDER — HYDROCODONE-ACETAMINOPHEN 5-325 MG PO TABS: 1.0000 | ORAL_TABLET | ORAL | 0 refills | Status: DC | PRN

## 2019-04-23 MED ORDER — PREDNISONE 10 MG (21) PO TBPK: ORAL_TABLET | ORAL | 0 refills | Status: DC

## 2019-04-23 NOTE — Progress Notes (Signed)
Patient: Jill Porter Female    DOB: 01-25-1966   53 y.o.   MRN: [id number] Visit Date: 04/23/2019  Today's Provider: Wilhemena Durie, MD   Chief Complaint  Patient presents with  . Back Pain  . Leg Pain   Subjective:   HPI Patient comes in today c/o severe lower back and right leg pain. She reports that he had a fall about 8 weeks ago, and her pain has been worse ever since. She has taken her husbands diclofenac and reports that it has helped a little. She denies numbness or tingling. She does have trouble bearing weight on her right leg. She denies any swelling. She has been seeing a chiropractor which she thinks has made her worse. BP Readings from Last 3 Encounters:  04/23/19 (!) 142/80  03/12/19 126/78  07/04/18 (!) 148/89   Wt Readings from Last 3 Encounters:  04/23/19 129 lb (58.5 kg)  03/12/19 129 lb (58.5 kg)  07/04/18 124 lb 4.8 oz (56.4 kg)    Allergies  Allergen Reactions  . Sulfa Antibiotics Hives  . Penicillins Itching and Rash     Current Outpatient Medications:  .  cyclobenzaprine (FLEXERIL) 10 MG tablet, Take 1 tablet (10 mg total) by mouth 3 (three) times daily as needed for muscle spasms., Disp: 45 tablet, Rfl: 1 .  hydrochlorothiazide (HYDRODIURIL) 25 MG tablet, TAKE 1 TABLET BY MOUTH DAILY, Disp: 30 tablet, Rfl: 11 .  losartan (COZAAR) 50 MG tablet, TAKE 1 TABLET BY MOUTH EVERY DAY, Disp: 30 tablet, Rfl: 11 .  zolpidem (AMBIEN) 10 MG tablet, Take 1 tablet (10 mg total) by mouth at bedtime as needed for sleep., Disp: 30 tablet, Rfl: 5 .  gabapentin (NEURONTIN) 100 MG capsule, TAKE ONE CAPSULE BY MOUTH TWICE A DAY FOR 7 DAYS THEN TAKE 2 CAPSULES BY MOUTH TWICE A DAY, Disp: , Rfl: 3 .  meloxicam (MOBIC) 15 MG tablet, TAKE 1 TABLET BY MOUTH EVERY DAY (Patient not taking: Reported on 04/23/2019), Disp: 30 tablet, Rfl: 5  Review of Systems  Constitutional: Negative for activity change and fatigue.  Respiratory: Negative.   Cardiovascular:  Negative for leg swelling.  Gastrointestinal: Negative.   Genitourinary: Negative.   Musculoskeletal: Positive for arthralgias, back pain, gait problem and myalgias.  Allergic/Immunologic: Negative.   Neurological: Positive for weakness and numbness. Negative for dizziness and headaches.  Psychiatric/Behavioral: Negative for agitation, self-injury, sleep disturbance and suicidal ideas. The patient is not nervous/anxious.     Social History   Tobacco Use  . Smoking status: Never Smoker  . Smokeless tobacco: Never Used  Substance Use Topics  . Alcohol use: No    Alcohol/week: 0.0 standard drinks      Objective:   BP (!) 142/80   Pulse (!) 102   Temp 99 F (37.2 C)   Resp 16   Ht 5\' 4"  (1.626 m)   Wt 129 lb (58.5 kg)   BMI 22.14 kg/m  Vitals:   04/23/19 1502  BP: (!) 142/80  Pulse: (!) 102  Resp: 16  Temp: 99 F (37.2 C)  Weight: 129 lb (58.5 kg)  Height: 5\' 4"  (1.626 m)     Physical Exam Vitals signs reviewed.  Constitutional:      Comments: Pt in obvious pain with any movement.  HENT:     Head: Normocephalic and atraumatic.     Right Ear: External ear normal.     Left Ear: External ear normal.  Eyes:  General: No scleral icterus. Cardiovascular:     Rate and Rhythm: Normal rate and regular rhythm.     Heart sounds: Normal heart sounds.  Pulmonary:     Effort: Pulmonary effort is normal.     Breath sounds: Normal breath sounds.  Abdominal:     Palpations: Abdomen is soft.  Musculoskeletal:     Comments: Positive straight leg raise on right.  Skin:    General: Skin is warm and dry.  Neurological:     Mental Status: She is alert and oriented to person, place, and time.     Comments: DTRs normal but possible proximal weakness of right leg.  Psychiatric:        Mood and Affect: Mood normal.        Behavior: Behavior normal.        Thought Content: Thought content normal.        Judgment: Judgment normal.      No results found for any visits on  04/23/19.     Assessment & Plan    1. Sciatica of right side Treat with steroid taper and RTC next week. Norco for her moderate/severe pain. May need MRI if not improved. - DG Lumbar Spine Complete; Future - DG Hip Unilat W OR W/O Pelvis 2-3 Views Right; Future  2. Pain of right hip joint I do not think it is her hip but she is favoring her right so much I must check it. - DG Hip Unilat W OR W/O Pelvis 2-3 Views Right; Future     Wilhemena Durie, MD  [place]

## 2019-04-24 ENCOUNTER — Telehealth: Payer: Self-pay

## 2019-04-24 NOTE — Telephone Encounter (Signed)
Left message to call back  

## 2019-04-24 NOTE — Telephone Encounter (Signed)
Pt advised.   Thanks,   -Cathlyn Tersigni  

## 2019-04-24 NOTE — Telephone Encounter (Signed)
-----   Message from [name]., MD sent at 04/24/2019 12:56 PM EDT ----- [place] ok

## 2019-04-29 ENCOUNTER — Telehealth: Payer: Self-pay | Admitting: Family Medicine

## 2019-04-29 ENCOUNTER — Encounter: Payer: Self-pay | Admitting: Family Medicine

## 2019-04-29 ENCOUNTER — Ambulatory Visit (INDEPENDENT_AMBULATORY_CARE_PROVIDER_SITE_OTHER): Payer: 59 | Admitting: Family Medicine

## 2019-04-29 ENCOUNTER — Other Ambulatory Visit: Payer: Self-pay

## 2019-04-29 VITALS — BP 137/82 | HR 102 | Temp 98.2°F | Resp 16 | Wt 137.8 lb

## 2019-04-29 DIAGNOSIS — M5431 Sciatica, right side: Secondary | ICD-10-CM | POA: Diagnosis not present

## 2019-04-29 DIAGNOSIS — M25551 Pain in right hip: Secondary | ICD-10-CM | POA: Diagnosis not present

## 2019-04-29 DIAGNOSIS — M5441 Lumbago with sciatica, right side: Secondary | ICD-10-CM | POA: Diagnosis not present

## 2019-04-29 MED ORDER — OXYCODONE HCL 5 MG PO TABS: 5.0000 mg | ORAL_TABLET | ORAL | 0 refills | Status: DC | PRN

## 2019-04-29 MED ORDER — KETOROLAC TROMETHAMINE 60 MG/2ML IM SOLN
60.0000 mg | Freq: Once | INTRAMUSCULAR | Status: AC
  Administered 2019-04-29: 60 mg via INTRAMUSCULAR

## 2019-04-29 NOTE — Telephone Encounter (Signed)
I need office note completed to get auth for urgent MRI,Thanks

## 2019-04-29 NOTE — Telephone Encounter (Signed)
Pt's husband called saying his wife was in the office this morning with pain in her back and down her leg  She was given an injection told that we were sending in Oxycodone to the pharmacy.  Nothing has been sent to the pharmacy yet.  [place]

## 2019-04-29 NOTE — Telephone Encounter (Signed)
done--sorry

## 2019-04-29 NOTE — Telephone Encounter (Signed)
Please sign Rx thanks!

## 2019-04-29 NOTE — Telephone Encounter (Signed)
Note done--thanks--she is in a lot of pain.

## 2019-04-29 NOTE — Telephone Encounter (Signed)
Please advise 

## 2019-04-29 NOTE — Progress Notes (Signed)
Patient: Jill Porter Female    DOB: July 01, 1966   53 y.o.   MRN: [id number] Visit Date: 04/29/2019  Today's Provider: Wilhemena Durie, MD   No chief complaint on file.  Subjective:     HPI  One week follow up right leg and thigh pain. Patient states that pain is worse and has moved down to her foot.  Pt has had no relief from prednisone taper and is miserable due to the pain. It goes down the right leg and she says she seems to have pain in the saddle region.No rash. Allergies  Allergen Reactions  . Sulfa Antibiotics Hives  . Penicillins Itching and Rash     Current Outpatient Medications:  .  cyclobenzaprine (FLEXERIL) 10 MG tablet, Take 1 tablet (10 mg total) by mouth 3 (three) times daily as needed for muscle spasms., Disp: 45 tablet, Rfl: 1 .  hydrochlorothiazide (HYDRODIURIL) 25 MG tablet, TAKE 1 TABLET BY MOUTH DAILY, Disp: 30 tablet, Rfl: 11 .  HYDROcodone-acetaminophen (NORCO/VICODIN) 5-325 MG tablet, Take 1 tablet by mouth every 4 (four) hours as needed for moderate pain., Disp: 45 tablet, Rfl: 0 .  losartan (COZAAR) 50 MG tablet, TAKE 1 TABLET BY MOUTH EVERY DAY, Disp: 30 tablet, Rfl: 11 .  predniSONE (STERAPRED UNI-PAK 21 TAB) 10 MG (21) TBPK tablet, Taper as directed, Disp: 21 tablet, Rfl: 0 .  zolpidem (AMBIEN) 10 MG tablet, Take 1 tablet (10 mg total) by mouth at bedtime as needed for sleep., Disp: 30 tablet, Rfl: 5 .  gabapentin (NEURONTIN) 100 MG capsule, TAKE ONE CAPSULE BY MOUTH TWICE A DAY FOR 7 DAYS THEN TAKE 2 CAPSULES BY MOUTH TWICE A DAY, Disp: , Rfl: 3 .  meloxicam (MOBIC) 15 MG tablet, TAKE 1 TABLET BY MOUTH EVERY DAY (Patient not taking: Reported on 04/23/2019), Disp: 30 tablet, Rfl: 5  Review of Systems  Constitutional: Negative.   Respiratory: Negative.   Gastrointestinal: Negative.   Genitourinary: Negative.   Musculoskeletal:       Pain down right leg. Posterior leg. No swelling.  Skin:       She said her foot looked boue a little  while ago. Not cold. Normal now.  Neurological: Negative.   Psychiatric/Behavioral: Negative.   All other systems reviewed and are negative.   Social History   Tobacco Use  . Smoking status: Never Smoker  . Smokeless tobacco: Never Used  Substance Use Topics  . Alcohol use: No    Alcohol/week: 0.0 standard drinks      Objective:   BP 137/82 (BP Location: Right Arm, Patient Position: Sitting, Cuff Size: Normal)   Pulse (!) 102   Temp 98.2 F (36.8 C) (Oral)   Resp 16   Wt 137 lb 12.8 oz (62.5 kg)   SpO2 98%   BMI 23.65 kg/m  Vitals:   04/29/19 0849  BP: 137/82  Pulse: (!) 102  Resp: 16  Temp: 98.2 F (36.8 C)  TempSrc: Oral  SpO2: 98%  Weight: 137 lb 12.8 oz (62.5 kg)     Physical Exam Constitutional:      Appearance: Normal appearance.     Comments: In pain.  HENT:     Right Ear: External ear normal.     Left Ear: External ear normal.  Eyes:     General: No scleral icterus.    Conjunctiva/sclera: Conjunctivae normal.  Cardiovascular:     Rate and Rhythm: Normal rate.     Pulses: Normal pulses.  Heart sounds: Normal heart sounds.     Comments: LE vascular exam intact. Pulmonary:     Effort: Pulmonary effort is normal.  Abdominal:     Palpations: Abdomen is soft.  Musculoskeletal:        General: No swelling, tenderness or deformity.     Right lower leg: No edema.     Left lower leg: No edema.  Skin:    General: Skin is warm and dry.  Neurological:     Mental Status: She is alert.     Comments: Straight leg raise strongly positive on right. She appears to have weakness of right leg--exam very limited by pain.   Psychiatric:        Mood and Affect: Mood normal.        Behavior: Behavior normal.        Thought Content: Thought content normal.        Judgment: Judgment normal.      No results found for any visits on 04/29/19.     Assessment & Plan    1. Sciatica of right side No better with steroids and symptoms worsening. Needs MRI.  She is given Rx for Oxycodone for severe pain. Plan to refer to back surgeon if MRI as anticipated. - Ambulatory referral to Neurosurgery - ketorolac (TORADOL) injection 60 mg  2. Pain of right hip joint Hip xray ok. Referred pain. - ketorolac (TORADOL) injection 60 mg  3. Low back pain with right-sided sciatica, unspecified back pain laterality, unspecified chronicity  - MR Lumbar Spine Wo Contrast; Future - Ambulatory referral to Neurosurgery     Wilhemena Durie, MD  [facility]

## 2019-04-30 ENCOUNTER — Ambulatory Visit
Admission: RE | Admit: 2019-04-30 | Discharge: 2019-04-30 | Disposition: A | Discharge status: Home / Self Care | Payer: 59 | Source: Ambulatory Visit | Attending: Family Medicine | Admitting: Family Medicine

## 2019-04-30 DIAGNOSIS — M5441 Lumbago with sciatica, right side: Secondary | ICD-10-CM | POA: Insufficient documentation

## 2019-05-01 ENCOUNTER — Telehealth: Payer: Self-pay | Admitting: Family Medicine

## 2019-05-01 ENCOUNTER — Other Ambulatory Visit: Payer: Self-pay

## 2019-05-01 DIAGNOSIS — M5116 Intervertebral disc disorders with radiculopathy, lumbar region: Secondary | ICD-10-CM

## 2019-05-01 NOTE — Telephone Encounter (Signed)
Please review

## 2019-05-01 NOTE — Telephone Encounter (Signed)
Dr Darnell Level please review

## 2019-05-01 NOTE — Telephone Encounter (Signed)
Pt's insurance company would like to speak peer to peer to get MRI approved Case # [id number]. Phone # [phone] option 1

## 2019-05-02 ENCOUNTER — Ambulatory Visit (INDEPENDENT_AMBULATORY_CARE_PROVIDER_SITE_OTHER): Payer: 59 | Admitting: Family Medicine

## 2019-05-02 ENCOUNTER — Other Ambulatory Visit: Payer: Self-pay

## 2019-05-02 DIAGNOSIS — M5441 Lumbago with sciatica, right side: Secondary | ICD-10-CM | POA: Diagnosis not present

## 2019-05-02 MED ORDER — KETOROLAC TROMETHAMINE 60 MG/2ML IM SOLN
60.0000 mg | Freq: Once | INTRAMUSCULAR | Status: AC
  Administered 2019-05-02: 60 mg via INTRAMUSCULAR

## 2019-05-02 NOTE — Progress Notes (Signed)
Patient here for Toradol injection only.

## 2019-05-07 DIAGNOSIS — Z9889 Other specified postprocedural states: Secondary | ICD-10-CM | POA: Insufficient documentation

## 2019-05-07 NOTE — Telephone Encounter (Signed)
Pt's husband Herbie Baltimore stated he was returning Sarah's call from yesterday. Please advise. Thanks TNP

## 2019-06-02 ENCOUNTER — Other Ambulatory Visit: Payer: Self-pay | Admitting: Family Medicine

## 2019-06-02 DIAGNOSIS — I10 Essential (primary) hypertension: Secondary | ICD-10-CM

## 2019-07-08 ENCOUNTER — Encounter: Payer: Managed Care, Other (non HMO) | Admitting: Obstetrics and Gynecology

## 2019-07-09 ENCOUNTER — Ambulatory Visit (INDEPENDENT_AMBULATORY_CARE_PROVIDER_SITE_OTHER): Payer: 59

## 2019-07-09 ENCOUNTER — Other Ambulatory Visit: Payer: Self-pay

## 2019-07-09 DIAGNOSIS — Z23 Encounter for immunization: Secondary | ICD-10-CM | POA: Diagnosis not present

## 2019-07-10 ENCOUNTER — Other Ambulatory Visit (HOSPITAL_COMMUNITY)
Admission: RE | Admit: 2019-07-10 | Discharge: 2019-07-10 | Disposition: A | Discharge status: Home / Self Care | Payer: 59 | Source: Ambulatory Visit | Attending: Obstetrics and Gynecology | Admitting: Obstetrics and Gynecology

## 2019-07-10 ENCOUNTER — Ambulatory Visit (INDEPENDENT_AMBULATORY_CARE_PROVIDER_SITE_OTHER): Payer: 59 | Admitting: Obstetrics and Gynecology

## 2019-07-10 ENCOUNTER — Encounter: Payer: Self-pay | Admitting: Obstetrics and Gynecology

## 2019-07-10 VITALS — BP 149/89 | HR 98 | Ht 64.0 in | Wt 135.2 lb

## 2019-07-10 DIAGNOSIS — N951 Menopausal and female climacteric states: Secondary | ICD-10-CM | POA: Diagnosis not present

## 2019-07-10 DIAGNOSIS — Z01419 Encounter for gynecological examination (general) (routine) without abnormal findings: Secondary | ICD-10-CM

## 2019-07-10 DIAGNOSIS — Z1231 Encounter for screening mammogram for malignant neoplasm of breast: Secondary | ICD-10-CM | POA: Diagnosis not present

## 2019-07-10 DIAGNOSIS — Z124 Encounter for screening for malignant neoplasm of cervix: Secondary | ICD-10-CM | POA: Insufficient documentation

## 2019-07-10 MED ORDER — ESTRADIOL 1 MG PO TABS: 1.0000 mg | ORAL_TABLET | Freq: Every day | ORAL | 3 refills | Status: DC

## 2019-07-10 NOTE — Progress Notes (Signed)
lab4Patient comes in today for annual exam. She has no concerns today. She is due for mammogram.

## 2019-07-10 NOTE — Progress Notes (Signed)
HPI:      [name] is a 53 y.o. [phone] who LMP was No LMP recorded. Patient has had a hysterectomy.  Subjective:   She presents today for her annual examination.  She initially has no specific complaints but after discussing signs and symptoms of menopause she complains of hot flashes.  In fact during our discussion of hot flashes she had hot flash here in the office.  She has not been using the vaginal estrogen cream as previously prescribed. She is sexually active. Patient has a remote history of dysplasia which is why she had a hysterectomy.  Her last physician recommended annual Pap smears until 2030. (Vaginal cuff) Patient is fasting this morning and desires blood work.    Hx: The following portions of the patient's history were reviewed and updated as appropriate:             She  has a past medical history of Cervical dysphagia, Climacteric, Hypertension, Melanoma ([place]), Motion sickness, Pelvic adhesions, and Recurrent cystitis. She does not have any pertinent problems on file. She  has a past surgical history that includes Cholecystectomy (06/19/2012); Melanoma excision (06/2012); Excision basal cell carcinoma (2013); Breast cyst aspiration (01/2010); Ovary surgery (1997); Abdominal hysterectomy (2000); Liposuction; Exploratory laparotomy; Rotator cuff repair; Colonoscopy with propofol (N/A, 07/06/2015); and Parathyroidectomy (04/14/2017). Her family history includes Heart attack (age of onset: 53) in her maternal grandfather; Hypertension in her father and mother. She  reports that she has never smoked. She has never used smokeless tobacco. She reports that she does not drink alcohol or use drugs. She has a current medication list which includes the following prescription(s): cyclobenzaprine, gabapentin, hydrochlorothiazide, losartan, zolpidem, estradiol, and meloxicam. She is allergic to sulfa antibiotics and penicillins.       Review of Systems:  Review of  Systems  Constitutional: Denied constitutional symptoms, night sweats, recent illness, fatigue, fever, insomnia and weight loss.  Eyes: Denied eye symptoms, eye pain, photophobia, vision change and visual disturbance.  Ears/Nose/Throat/Neck: Denied ear, nose, throat or neck symptoms, hearing loss, nasal discharge, sinus congestion and sore throat.  Cardiovascular: Denied cardiovascular symptoms, arrhythmia, chest pain/pressure, edema, exercise intolerance, orthopnea and palpitations.  Respiratory: Denied pulmonary symptoms, asthma, pleuritic pain, productive sputum, cough, dyspnea and wheezing.  Gastrointestinal: Denied, gastro-esophageal reflux, melena, nausea and vomiting.  Genitourinary: Denied genitourinary symptoms including symptomatic vaginal discharge, pelvic relaxation issues, and urinary complaints.  Musculoskeletal: Denied musculoskeletal symptoms, stiffness, swelling, muscle weakness and myalgia.  Dermatologic: Denied dermatology symptoms, rash and scar.  Neurologic: Denied neurology symptoms, dizziness, headache, neck pain and syncope.  Psychiatric: Denied psychiatric symptoms, anxiety and depression.  Endocrine: Denied endocrine symptoms including hot flashes and night sweats.   Meds:   Current Outpatient Medications on File Prior to Visit  Medication Sig Dispense Refill  . cyclobenzaprine (FLEXERIL) 10 MG tablet TAKE 1 TABLET BY MOUTH THREE TIMES A DAY AS NEEDED FOR MUSCLE SPASMS 45 tablet 1  . gabapentin (NEURONTIN) 100 MG capsule TAKE ONE CAPSULE BY MOUTH TWICE A DAY FOR 7 DAYS THEN TAKE 2 CAPSULES BY MOUTH TWICE A DAY  3  . hydrochlorothiazide (HYDRODIURIL) 25 MG tablet TAKE 1 TABLET BY MOUTH DAILY 30 tablet 11  . losartan (COZAAR) 50 MG tablet TAKE 1 TABLET BY MOUTH EVERY DAY 30 tablet 11  . zolpidem (AMBIEN) 10 MG tablet Take 1 tablet (10 mg total) by mouth at bedtime as needed for sleep. 30 tablet 5  . meloxicam (MOBIC) 15 MG tablet TAKE 1 TABLET BY  MOUTH EVERY DAY  (Patient not taking: Reported on 04/23/2019) 30 tablet 5   No current facility-administered medications on file prior to visit.     Objective:     Vitals:   07/10/19 0857  BP: (!) 149/89  Pulse: 98              Physical examination General NAD, Conversant  HEENT Atraumatic; Op clear with mmm.  Normo-cephalic. Pupils reactive. Anicteric sclerae  Thyroid/Neck Smooth without nodularity or enlargement. Normal ROM.  Neck Supple.  Skin No rashes, lesions or ulceration. Normal palpated skin turgor. No nodularity.  Breasts: No masses or discharge.  Symmetric.  No axillary adenopathy.  History of augmentation  Lungs: Clear to auscultation.No rales or wheezes. Normal Respiratory effort, no retractions.  Heart: NSR.  No murmurs or rubs appreciated. No periferal edema  Abdomen: Soft.  Non-tender.  No masses.  No HSM. No hernia abdominoplasty  Extremities: Moves all appropriately.  Normal ROM for age. No lymphadenopathy.  Neuro: Oriented to PPT.  Normal mood. Normal affect.     Pelvic:   Vulva: Normal appearance.  No lesions.   Vagina: No lesions or abnormalities noted.  Moderate vaginal atrophy no vaginal cuff lesions noted  Support: Normal pelvic support.  Urethra No masses tenderness or scarring.  Meatus Normal size without lesions or prolapse.  Cervix: Surgically absent   Anus: Normal exam.  No lesions.  Perineum: Normal exam.  No lesions.        Bimanual   Uterus: Surgically absent   Adnexae: No masses.  Non-tender to palpation.  Cul-de-sac: Negative for abnormality.     Assessment:    [id number] Patient Active Problem List   Diagnosis Date Noted  . Special screening for malignant neoplasms, colon   . Perimenopausal vasomotor symptoms 06/10/2015  . Status post hysterectomy 06/10/2015  . History of cervical dysplasia 06/10/2015  . History of oophorectomy, unilateral 06/10/2015  . Cholelithiasis 03/31/2015  . Melanoma ([place]) 03/31/2015  . Hx of abnormal cervical Pap smear  03/31/2015  . Tinea versicolor 03/31/2015  . Vitamin D deficiency 03/31/2015  . Hyperlipemia 03/31/2015  . Depression 03/31/2015  . Acute anxiety 03/31/2015  . Insomnia 03/31/2015  . Hypertension 03/31/2015  . Plantar fasciitis 03/31/2015  . Frequent UTI 03/31/2015  . Discoid eczema 03/31/2015     1. Well woman exam with routine gynecological exam   2. Encounter for screening mammogram for malignant neoplasm of breast   3. Screening for cervical cancer   4. Symptomatic menopausal or female climacteric states        Plan:            1.  Basic Screening Recommendations The basic screening recommendations for asymptomatic women were discussed with the patient during her visit.  The age-appropriate recommendations were discussed with her and the rational for the tests reviewed.  When I am informed by the patient that another primary care physician has previously obtained the age-appropriate tests and they are up-to-date, only outstanding tests are ordered and referrals given as necessary.  Abnormal results of tests will be discussed with her when all of her results are completed. Pap of the vaginal cuff performed-mammogram ordered- screening blood work today. 2.  HRT I have discussed HRT with the patient in detail.  The risk/benefits of it were reviewed.  She understands that during menopause Estrogen decreases dramatically and that this results in an increased risk of cardiovascular disease as well as osteoporosis.  We have also discussed the fact that hot  flashes often result from a decrease in Estrogen, and that by replacing Estrogen, they can often be alleviated.  We have discussed skin, vaginal and urinary tract changes that may also take place from this drop in Estrogen.  Emotional changes have also been linked to Estrogen and we have briefly discussed this.  The benefits of HRT including decrease in hot flashes, vaginal dryness, and osteoporosis were discussed.  The emotional benefit  and a possible change in her cardiovascular risk profile was also reviewed.  The risks associated with Hormone Replacement Therapy were also reviewed.  The use of unopposed Estrogen and its relationship to endometrial cancer was discussed.  The addition of Progesterone and its beneficial effect on endometrial cancer was also noted.  The fact that there has been no consistent definitive studies showing an increase in breast cancer in women who use HRT was discussed with the patient.  The possible side effects including breast tenderness, fluid retention, mood changes and vaginal bleeding were discussed.  The patient was informed that this is an elective medication and that she may choose not to take Hormone Replacement Therapy.  I believe that after answering all of the patient's questions, she has an adequate and informed understanding of HRT.  Special emphasis on the WHI study, as well as several studies since that pertaining to the risks and benefits of estrogen replacement therapy were compared.  The possible limitations of these studies were discussed including the age stratification of the WHI study.  The possible role of Progesterone in these studies was discussed in detail.  I believe that the patient has an informed knowledge of the risks and benefits of HRT.  I have specifically discussed WHI findings and current updates.  Different type of hormone formulation and methods of taking hormone replacement therapy discussed.  Patient would like to begin ERT.  Orders Orders Placed This Encounter  Procedures  . MM 3D SCREEN BREAST BILATERAL  . Hemoglobin A1c  . Lipid panel  . TSH     Meds ordered this encounter  Medications  . estradiol (ESTRACE) 1 MG tablet    Sig: Take 1 tablet (1 mg total) by mouth daily.    Dispense:  30 tablet    Refill:  3        F/U  Return in about 3 months (around 10/10/2019).  Finis Bud, M.D. 07/10/2019 9:33 AM

## 2019-07-11 LAB — LIPID PANEL
Chol/HDL Ratio: 4.5 ratio — ABNORMAL HIGH (ref 0.0–4.4)
Cholesterol, Total: 234 mg/dL — ABNORMAL HIGH (ref 100–199)
HDL: 52 mg/dL (ref >39)
LDL Chol Calc (NIH): 150 mg/dL — ABNORMAL HIGH (ref 0–99)
Triglycerides: 175 mg/dL — ABNORMAL HIGH (ref 0–149)
VLDL Cholesterol Cal: 32 mg/dL (ref 5–40)

## 2019-07-11 LAB — HEMOGLOBIN A1C
Est. average glucose Bld gHb Est-mCnc: 108 mg/dL
Hgb A1c MFr Bld: 5.4 % (ref 4.8–5.6)

## 2019-07-11 LAB — TSH: TSH: 0.562 u[IU]/mL (ref 0.450–4.500)

## 2019-07-12 ENCOUNTER — Telehealth: Payer: Self-pay | Admitting: Obstetrics and Gynecology

## 2019-07-12 NOTE — Telephone Encounter (Signed)
The patient called and stated that she missed a call from her nurse Roselyn Reef and is requesting a call back, Please advise.

## 2019-07-12 NOTE — Telephone Encounter (Signed)
LM for patient to return call.

## 2019-07-17 NOTE — Telephone Encounter (Signed)
Spoke to patient about lab results.

## 2019-07-18 LAB — CYTOLOGY - PAP
Comment: NEGATIVE
Diagnosis: NEGATIVE
High risk HPV: NEGATIVE

## 2019-07-26 ENCOUNTER — Other Ambulatory Visit: Payer: Self-pay | Admitting: Family Medicine

## 2019-07-26 NOTE — Telephone Encounter (Signed)
Pharmacy requesting refills. Thanks!  

## 2019-07-31 ENCOUNTER — Other Ambulatory Visit: Payer: Self-pay | Admitting: Family Medicine

## 2019-08-01 ENCOUNTER — Other Ambulatory Visit: Payer: Self-pay | Admitting: Obstetrics and Gynecology

## 2019-08-01 DIAGNOSIS — N951 Menopausal and female climacteric states: Secondary | ICD-10-CM

## 2019-08-30 ENCOUNTER — Other Ambulatory Visit: Payer: Self-pay | Admitting: Family Medicine

## 2019-08-30 NOTE — Telephone Encounter (Signed)
L.O.V. was on 05/02/2019 and upcoming appointment on 09/11/2019.

## 2019-08-30 NOTE — Telephone Encounter (Signed)
Requested medication (s) are due for refill today: yes  Requested medication (s) are on the active medication list: yes  Last refill: 08/20/2019  Future visit scheduled: yes  Notes to clinic: not delegated    Requested Prescriptions  Pending Prescriptions Disp Refills   cyclobenzaprine (FLEXERIL) 10 MG tablet [Pharmacy Med Name: CYCLOBENZAPRINE 10 MG TABLET] 45 tablet 1    Sig: TAKE 1 TABLET BY MOUTH THREE TIMES A DAY AS NEEDED FOR MUSCLE SPASMS     Not Delegated - Analgesics:  Muscle Relaxants Failed - 08/30/2019  1:47 AM      Failed - This refill cannot be delegated      Passed - Valid encounter within last 6 months    Recent Outpatient Visits          4 months ago Low back pain with right-sided sciatica, unspecified back pain laterality, unspecified chronicity   [facility] [name]., MD   4 months ago Sciatica of right side   [facility] [name]., MD   4 months ago Sciatica of right side   [facility] [name]., MD   5 months ago Essential hypertension   [facility] [name]., MD   1 year ago Annual physical exam   [facility] [name]., MD      Future Appointments            In 1 week [name]., MD [facility] [place]   In 10 months [name], [name], MD [facility]

## 2019-09-06 NOTE — Progress Notes (Signed)
Patient: Jill Porter Female    DOB: November 07, 1965   53 y.o.   MRN: [id number] Visit Date: 09/11/2019  Today's Provider: Wilhemena Durie, MD   Chief Complaint  Patient presents with  . Hypertension  . Hyperlipidemia  . Back Pain  . Hip Pain  . Insomnia   Subjective:     HPI  Overall she is doing fairly well.  She has been under increased stress recently as her mother has been diagnosed with lymphoma and myelodysplastic syndrome.  Her son continues to struggle with substances. Essential hypertension From 11/28/2017-Better today. Follow. Patient states that Dr Evorn Gong is concerned about her use of HCTZ as this may enhance the possibility of squamous cell and basal cell carcinomas.  The patient has already had several squamous cell carcinomas.  Hyperlipidemia, unspecified hyperlipidemia type From 11/28/2018-labs stable.   Insomnia, unspecified type From 03/12/2019-Refill Ambien.  Sciatica of right side From 04/29/2019-No better with steroids and symptoms worsening. Needs MRI. She is given Rx for Oxycodone for severe pain. Referred to back surgeon.  Pain of right hip joint From 04/29/2019-Hip xray ok. Referred pain. Given ketorolac (TORADOL) injection 60 mg.  Patient states that she is having trouble with it today.  It is radiating all the way down to her foot.  Low back pain with right-sided sciatica, unspecified back pain laterality, unspecified chronicity From 04/29/2019-She is given Rx for Oxycodone for severe pain. Referred to back neurosurgeon.  Patient is scheduled for MRI with contrast tomorrow.    Allergies  Allergen Reactions  . Sulfa Antibiotics Hives  . Penicillins Itching and Rash     Current Outpatient Medications:  .  cyclobenzaprine (FLEXERIL) 10 MG tablet, TAKE 1 TABLET BY MOUTH THREE TIMES A DAY AS NEEDED FOR MUSCLE SPASMS, Disp: 45 tablet, Rfl: 1 .  ergocalciferol (VITAMIN D2) 1.25 MG (50000 UT) capsule, ergocalciferol (vitamin D2) 1,250 mcg  (50,000 unit) capsule, Disp: , Rfl:  .  estradiol (ESTRACE) 1 MG tablet, TAKE 1 TABLET BY MOUTH EVERY DAY, Disp: 90 tablet, Rfl: 1 .  gabapentin (NEURONTIN) 300 MG capsule, Take by mouth 2 (two) times daily. , Disp: , Rfl:  .  hydrochlorothiazide (HYDRODIURIL) 25 MG tablet, TAKE 1 TABLET BY MOUTH EVERY DAY, Disp: 90 tablet, Rfl: 3 .  ibuprofen (ADVIL) 800 MG tablet, Take 800 mg by mouth every 6 (six) hours as needed. for pain, Disp: , Rfl:  .  losartan (COZAAR) 50 MG tablet, TAKE 1 TABLET BY MOUTH EVERY DAY, Disp: 30 tablet, Rfl: 11 .  zolpidem (AMBIEN) 10 MG tablet, Take 1 tablet (10 mg total) by mouth at bedtime as needed for sleep., Disp: 30 tablet, Rfl: 5  Review of Systems  Constitutional: Negative for appetite change, chills, fatigue and fever.  Respiratory: Negative for chest tightness and shortness of breath.   Cardiovascular: Negative for chest pain and palpitations.  Gastrointestinal: Negative for abdominal pain, nausea and vomiting.  Endocrine: Negative.   Musculoskeletal: Positive for arthralgias and back pain.  Allergic/Immunologic: Negative.   Neurological: Negative for dizziness, weakness and headaches.  Hematological: Negative.   Psychiatric/Behavioral: Positive for sleep disturbance (As long as she takes her medicine she does not have any trouble.).    Social History   Tobacco Use  . Smoking status: Never Smoker  . Smokeless tobacco: Never Used  Substance Use Topics  . Alcohol use: No    Alcohol/week: 0.0 standard drinks      Objective:   BP (!) 142/88 (BP  Location: Right Arm, Patient Position: Sitting, Cuff Size: Normal)   Pulse (!) 102   Temp (!) 96.6 F (35.9 C) (Skin)   Wt 122 lb (55.3 kg)   SpO2 99%   BMI 20.94 kg/m  Vitals:   09/11/19 0824  BP: (!) 142/88  Pulse: (!) 102  Temp: (!) 96.6 F (35.9 C)  TempSrc: Skin  SpO2: 99%  Weight: 122 lb (55.3 kg)  Body mass index is 20.94 kg/m.   Physical Exam Vitals reviewed.  Constitutional:       Appearance: Normal appearance.     Comments: In pain.  HENT:     Right Ear: External ear normal.     Left Ear: External ear normal.  Eyes:     General: No scleral icterus.    Conjunctiva/sclera: Conjunctivae normal.  Cardiovascular:     Rate and Rhythm: Normal rate.     Pulses: Normal pulses.     Heart sounds: Normal heart sounds.     Comments: LE vascular exam intact. Pulmonary:     Effort: Pulmonary effort is normal.  Abdominal:     Palpations: Abdomen is soft.  Musculoskeletal:        General: No swelling, tenderness or deformity.     Right lower leg: No edema.     Left lower leg: No edema.  Skin:    General: Skin is warm and dry.  Neurological:     Mental Status: She is alert.  Psychiatric:        Mood and Affect: Mood normal.        Behavior: Behavior normal.        Thought Content: Thought content normal.        Judgment: Judgment normal.      No results found for any visits on 09/11/19.     Assessment & Plan    1. Essential hypertension Controlled on losartan and HCTZ.  Presently will increase losartan to 100 mg with hopes of stopping the HCTZ in the future. - Comprehensive metabolic panel - TSH - losartan (COZAAR) 100 MG tablet; Take 0.5 tablets (50 mg total) by mouth daily.  Dispense: 90 tablet; Refill: 3  2. Status post lumbar discectomy She continues to have sciatica and back pain.  Followed by neurosurgery  3. Adjustment insomnia Would like to get her off the zolpidem in the future 4.HLD    Wilhemena Durie, MD  [facility]

## 2019-09-11 ENCOUNTER — Ambulatory Visit (INDEPENDENT_AMBULATORY_CARE_PROVIDER_SITE_OTHER): Payer: 59 | Admitting: Family Medicine

## 2019-09-11 ENCOUNTER — Other Ambulatory Visit: Payer: Self-pay

## 2019-09-11 VITALS — BP 142/88 | HR 102 | Temp 96.6°F | Wt 122.0 lb

## 2019-09-11 DIAGNOSIS — S46019A Strain of muscle(s) and tendon(s) of the rotator cuff of unspecified shoulder, initial encounter: Secondary | ICD-10-CM | POA: Insufficient documentation

## 2019-09-11 DIAGNOSIS — D351 Benign neoplasm of parathyroid gland: Secondary | ICD-10-CM

## 2019-09-11 DIAGNOSIS — F5102 Adjustment insomnia: Secondary | ICD-10-CM

## 2019-09-11 DIAGNOSIS — M25619 Stiffness of unspecified shoulder, not elsewhere classified: Secondary | ICD-10-CM | POA: Insufficient documentation

## 2019-09-11 DIAGNOSIS — M25519 Pain in unspecified shoulder: Secondary | ICD-10-CM | POA: Insufficient documentation

## 2019-09-11 DIAGNOSIS — Z9889 Other specified postprocedural states: Secondary | ICD-10-CM

## 2019-09-11 DIAGNOSIS — E785 Hyperlipidemia, unspecified: Secondary | ICD-10-CM | POA: Diagnosis not present

## 2019-09-11 DIAGNOSIS — I10 Essential (primary) hypertension: Secondary | ICD-10-CM | POA: Diagnosis not present

## 2019-09-11 DIAGNOSIS — M719 Bursopathy, unspecified: Secondary | ICD-10-CM | POA: Insufficient documentation

## 2019-09-11 DIAGNOSIS — M75110 Incomplete rotator cuff tear or rupture of unspecified shoulder, not specified as traumatic: Secondary | ICD-10-CM | POA: Insufficient documentation

## 2019-09-11 MED ORDER — LOSARTAN POTASSIUM 100 MG PO TABS: 50.0000 mg | ORAL_TABLET | Freq: Every day | ORAL | 3 refills | Status: DC

## 2019-09-12 LAB — COMPREHENSIVE METABOLIC PANEL
ALT: 13 IU/L (ref 0–32)
AST: 14 IU/L (ref 0–40)
Albumin/Globulin Ratio: 1.5 (ref 1.2–2.2)
Albumin: 4.3 g/dL (ref 3.8–4.9)
Alkaline Phosphatase: 70 IU/L (ref 39–117)
BUN/Creatinine Ratio: 12 (ref 9–23)
BUN: 12 mg/dL (ref 6–24)
Bilirubin Total: 0.2 mg/dL (ref 0.0–1.2)
CO2: 26 mmol/L (ref 20–29)
Calcium: 9.9 mg/dL (ref 8.7–10.2)
Chloride: 97 mmol/L (ref 96–106)
Creatinine, Ser: 0.99 mg/dL (ref 0.57–1.00)
GFR calc Af Amer: 75 mL/min/{1.73_m2} (ref >59)
GFR calc non Af Amer: 65 mL/min/{1.73_m2} (ref >59)
Globulin, Total: 2.9 g/dL (ref 1.5–4.5)
Glucose: 95 mg/dL (ref 65–99)
Potassium: 3.4 mmol/L — ABNORMAL LOW (ref 3.5–5.2)
Sodium: 139 mmol/L (ref 134–144)
Total Protein: 7.2 g/dL (ref 6.0–8.5)

## 2019-09-12 LAB — TSH: TSH: 1.28 u[IU]/mL (ref 0.450–4.500)

## 2019-09-30 ENCOUNTER — Other Ambulatory Visit: Payer: Self-pay | Admitting: Family Medicine

## 2019-09-30 DIAGNOSIS — G4700 Insomnia, unspecified: Secondary | ICD-10-CM

## 2019-10-10 ENCOUNTER — Encounter: Payer: Self-pay | Admitting: Obstetrics and Gynecology

## 2019-10-10 ENCOUNTER — Ambulatory Visit (INDEPENDENT_AMBULATORY_CARE_PROVIDER_SITE_OTHER): Payer: 59 | Admitting: Obstetrics and Gynecology

## 2019-10-10 ENCOUNTER — Other Ambulatory Visit: Payer: Self-pay

## 2019-10-10 VITALS — BP 154/92 | HR 101 | Ht 64.0 in | Wt 131.9 lb

## 2019-10-10 DIAGNOSIS — Z79890 Hormone replacement therapy (postmenopausal): Secondary | ICD-10-CM

## 2019-10-10 MED ORDER — ESTRADIOL 1 MG PO TABS: 1.0000 mg | ORAL_TABLET | Freq: Every day | ORAL | 2 refills | Status: DC

## 2019-10-10 NOTE — Progress Notes (Signed)
HPI:      Jill Porter is a 54 y.o. [phone] who LMP was No LMP recorded. Patient has had a hysterectomy.  Subjective:   She presents today for follow-up of estrogen replacement therapy.  She says her hot flashes have resolved.  She says she feels better on the medication and would like to continue.    Hx: The following portions of the patient's history were reviewed and updated as appropriate:             She  has a past medical history of Cervical dysphagia, Climacteric, Hypertension, Melanoma ([place]), Motion sickness, Pelvic adhesions, and Recurrent cystitis. She does not have any pertinent problems on file. She  has a past surgical history that includes Cholecystectomy (06/19/2012); Melanoma excision (06/2012); Excision basal cell carcinoma (2013); Breast cyst aspiration (01/2010); [place] surgery (1997); Abdominal hysterectomy (2000); Liposuction; Exploratory laparotomy; Rotator cuff repair; Colonoscopy with propofol (N/A, 07/06/2015); and Parathyroidectomy (04/14/2017). Her family history includes Heart attack (age of onset: 58) in her maternal grandfather; Hypertension in her father and mother. She  reports that she has never smoked. She has never used smokeless tobacco. She reports that she does not drink alcohol or use drugs. She has a current medication list which includes the following prescription(s): cyclobenzaprine, ergocalciferol, estradiol, gabapentin, hydrochlorothiazide, ibuprofen, losartan, and zolpidem. She is allergic to sulfa antibiotics and penicillins.       Review of Systems:  Review of Systems  Constitutional: Denied constitutional symptoms, night sweats, recent illness, fatigue, fever, insomnia and weight loss.  Eyes: Denied eye symptoms, eye pain, photophobia, vision change and visual disturbance.  Ears/Nose/Throat/Neck: Denied ear, nose, throat or neck symptoms, hearing loss, nasal discharge, sinus congestion and sore throat.  Cardiovascular: Denied cardiovascular  symptoms, arrhythmia, chest pain/pressure, edema, exercise intolerance, orthopnea and palpitations.  Respiratory: Denied pulmonary symptoms, asthma, pleuritic pain, productive sputum, cough, dyspnea and wheezing.  Gastrointestinal: Denied, gastro-esophageal reflux, melena, nausea and vomiting.  Genitourinary: Denied genitourinary symptoms including symptomatic vaginal discharge, pelvic relaxation issues, and urinary complaints.  Musculoskeletal: Denied musculoskeletal symptoms, stiffness, swelling, muscle weakness and myalgia.  Dermatologic: Denied dermatology symptoms, rash and scar.  Neurologic: Denied neurology symptoms, dizziness, headache, neck pain and syncope.  Psychiatric: Denied psychiatric symptoms, anxiety and depression.  Endocrine: Denied endocrine symptoms including hot flashes and night sweats.   Meds:   Current Outpatient Medications on File Prior to Visit  Medication Sig Dispense Refill  . cyclobenzaprine (FLEXERIL) 10 MG tablet TAKE 1 TABLET BY MOUTH THREE TIMES A DAY AS NEEDED FOR MUSCLE SPASMS 45 tablet 1  . ergocalciferol (VITAMIN D2) 1.25 MG (50000 UT) capsule ergocalciferol (vitamin D2) 1,250 mcg (50,000 unit) capsule    . estradiol (ESTRACE) 1 MG tablet TAKE 1 TABLET BY MOUTH EVERY DAY 90 tablet 1  . gabapentin (NEURONTIN) 300 MG capsule Take by mouth 2 (two) times daily.     . hydrochlorothiazide (HYDRODIURIL) 25 MG tablet TAKE 1 TABLET BY MOUTH EVERY DAY 90 tablet 3  . ibuprofen (ADVIL) 800 MG tablet Take 800 mg by mouth every 6 (six) hours as needed. for pain    . losartan (COZAAR) 100 MG tablet Take 0.5 tablets (50 mg total) by mouth daily. 90 tablet 3  . zolpidem (AMBIEN) 10 MG tablet TAKE 1 TABLET (10 MG TOTAL) BY MOUTH AT BEDTIME AS NEEDED FOR SLEEP. 30 tablet 3   No current facility-administered medications on file prior to visit.    Objective:     Vitals:   10/10/19 1006  BP: (!) 154/92  Pulse: (!) 101                Assessment:     [id number] Patient Active Problem List   Diagnosis Date Noted  . Incomplete tear of rotator cuff 09/11/2019  . Shoulder pain 09/11/2019  . Stiffness of shoulder joint 09/11/2019  . Strain of rotator cuff capsule 09/11/2019  . Disorder of bursae of shoulder region 09/11/2019  . Status post lumbar discectomy 05/07/2019  . Hand weakness 06/26/2018  . Numbness and tingling 05/30/2018  . Bilateral hand pain 04/03/2018  . Diplopia 04/03/2018  . Numbness and tingling in both hands 04/03/2018  . Special screening for malignant neoplasms, colon   . Perimenopausal vasomotor symptoms 06/10/2015  . Status post hysterectomy 06/10/2015  . History of cervical dysplasia 06/10/2015  . History of oophorectomy, unilateral 06/10/2015  . Cholelithiasis 03/31/2015  . Melanoma ([place]) 03/31/2015  . Hx of abnormal cervical Pap smear 03/31/2015  . Tinea versicolor 03/31/2015  . Vitamin D deficiency 03/31/2015  . Hyperlipemia 03/31/2015  . Depression 03/31/2015  . Acute anxiety 03/31/2015  . Insomnia 03/31/2015  . Hypertension 03/31/2015  . Plantar fasciitis 03/31/2015  . Frequent UTI 03/31/2015  . Discoid eczema 03/31/2015     1. Hormone replacement therapy (HRT)     Patient doing well on ERT.  She is happy with the results of no further hot flashes vaginal dryness or mood issues.   Plan:            1.  Continue ERT. Orders No orders of the defined types were placed in this encounter.   No orders of the defined types were placed in this encounter.     F/U  Return in about 10 months (around 08/09/2020) for Annual Physical. I spent 21 minutes involved in the care of this patient of which greater than 50% was spent discussing ERT, patient appreciated changes, Covid vaccination, use of Gardasil, future follow-ups.  All questions answered.  Finis Bud, M.D. 10/10/2019 10:38 AM

## 2019-10-10 NOTE — Progress Notes (Signed)
Patient comes in today for 3 month follow up for HRT.

## 2019-10-29 ENCOUNTER — Other Ambulatory Visit: Payer: Self-pay | Admitting: Family Medicine

## 2019-10-30 ENCOUNTER — Ambulatory Visit: Payer: 59 | Attending: Internal Medicine

## 2019-10-30 DIAGNOSIS — Z20822 Contact with and (suspected) exposure to covid-19: Secondary | ICD-10-CM

## 2019-10-30 NOTE — Telephone Encounter (Signed)
Requested medication (s) are due for refill today: yes  Requested medication (s) are on the active medication list:yes  Last refill: 09/17/2019  Future visit scheduled:yes  Notes to clinic:  not delegated    Requested Prescriptions  Pending Prescriptions Disp Refills   cyclobenzaprine (FLEXERIL) 10 MG tablet [Pharmacy Med Name: CYCLOBENZAPRINE 10 MG TABLET] 45 tablet 1    Sig: TAKE 1 TABLET BY MOUTH THREE TIMES A DAY AS NEEDED FOR MUSCLE SPASMS      Not Delegated - Analgesics:  Muscle Relaxants Failed - 10/29/2019 11:22 PM      Failed - This refill cannot be delegated      Passed - Valid encounter within last 6 months    Recent Outpatient Visits           1 month ago Status post lumbar discectomy   [facility] [name]., MD   6 months ago Low back pain with right-sided sciatica, unspecified back pain laterality, unspecified chronicity   [facility] [name]., MD   6 months ago Sciatica of right side   [facility] [name]., MD   6 months ago Sciatica of right side   [facility] [name]., MD   7 months ago Essential hypertension   [facility] [name]., MD       Future Appointments             In 1 month [name]., MD [facility] [place]   In 8 months [name], [name], MD [facility]   In 9 months [name], [name], MD [facility]

## 2019-11-01 LAB — NOVEL CORONAVIRUS, NAA: SARS-CoV-2, NAA: NOT DETECTED

## 2019-12-09 NOTE — Progress Notes (Deleted)
       Patient: Jill Porter Female    DOB: 03/06/1966   54 y.o.   MRN: [id number] Visit Date: 12/09/2019  Today's Provider: Wilhemena Durie, MD   No chief complaint on file.  Subjective:     HPI   Essential hypertension From 09/11/2019-Controlled on losartan and HCTZ.  Presently will increase losartan to 100 mg with hopes of stopping the HCTZ in the future. Labs checked showing-okay, potassium borderline low. Consider eating an extra banana a couple of times a week.  Adjustment insomnia From 09/11/2019-Would like to get her off the zolpidem in the future.  Hyperlipemia From 09/11/2019-Labs checked showing-okay, potassium borderline low. Consider eating an extra banana a couple of times a week.    Allergies  Allergen Reactions  . Sulfa Antibiotics Hives  . Penicillins Itching and Rash     Current Outpatient Medications:  .  cyclobenzaprine (FLEXERIL) 10 MG tablet, TAKE 1 TABLET BY MOUTH THREE TIMES A DAY AS NEEDED FOR MUSCLE SPASMS, Disp: 45 tablet, Rfl: 1 .  ergocalciferol (VITAMIN D2) 1.25 MG (50000 UT) capsule, ergocalciferol (vitamin D2) 1,250 mcg (50,000 unit) capsule, Disp: , Rfl:  .  estradiol (ESTRACE) 1 MG tablet, TAKE 1 TABLET BY MOUTH EVERY DAY, Disp: 90 tablet, Rfl: 1 .  estradiol (ESTRACE) 1 MG tablet, Take 1 tablet (1 mg total) by mouth daily., Disp: 90 tablet, Rfl: 2 .  gabapentin (NEURONTIN) 300 MG capsule, Take by mouth 2 (two) times daily. , Disp: , Rfl:  .  hydrochlorothiazide (HYDRODIURIL) 25 MG tablet, TAKE 1 TABLET BY MOUTH EVERY DAY, Disp: 90 tablet, Rfl: 3 .  ibuprofen (ADVIL) 800 MG tablet, Take 800 mg by mouth every 6 (six) hours as needed. for pain, Disp: , Rfl:  .  losartan (COZAAR) 100 MG tablet, Take 0.5 tablets (50 mg total) by mouth daily., Disp: 90 tablet, Rfl: 3 .  zolpidem (AMBIEN) 10 MG tablet, TAKE 1 TABLET (10 MG TOTAL) BY MOUTH AT BEDTIME AS NEEDED FOR SLEEP., Disp: 30 tablet, Rfl: 3  Review of Systems  Constitutional:  Negative for appetite change, chills, fatigue and fever.  Respiratory: Negative for chest tightness and shortness of breath.   Cardiovascular: Negative for chest pain and palpitations.  Gastrointestinal: Negative for abdominal pain, nausea and vomiting.  Neurological: Negative for dizziness and weakness.    Social History   Tobacco Use  . Smoking status: Never Smoker  . Smokeless tobacco: Never Used  Substance Use Topics  . Alcohol use: No    Alcohol/week: 0.0 standard drinks      Objective:   There were no vitals taken for this visit. There were no vitals filed for this visit.There is no height or weight on file to calculate BMI.   Physical Exam   No results found for any visits on 12/10/19.     Assessment & Plan        Wilhemena Durie, MD  [facility]

## 2019-12-10 ENCOUNTER — Ambulatory Visit: Payer: 59 | Admitting: Family Medicine

## 2019-12-25 NOTE — Progress Notes (Signed)
Patient: Jill Porter Female    DOB: 1966-07-30   54 y.o.   MRN: [id number] Visit Date: 12/30/2019  Today's Provider: Wilhemena Durie, MD   Chief Complaint  Patient presents with  . Follow-up  . Hypertension  . Hyperlipidemia   Subjective:     HPI  Overall patient is doing well. Essential hypertension From 09/11/2019-Controlled on losartan and HCTZ.  Presently will increase losartan to 100 mg with hopes of stopping the HCTZ in the future.  Status post lumbar discectomy From 09/11/2019-She continues to have sciatica and back pain.  Followed by neurosurgery. Pain is markedly improved from previous visits. Adjustment insomnia From 09/11/2019-Would like to get her off the zolpidem in the future.  Hyperlipemia  From 09/11/2019-not currently taking medication.   Allergies  Allergen Reactions  . Sulfa Antibiotics Hives  . Penicillins Itching and Rash     Current Outpatient Medications:  .  cyclobenzaprine (FLEXERIL) 10 MG tablet, TAKE 1 TABLET BY MOUTH THREE TIMES A DAY AS NEEDED FOR MUSCLE SPASMS, Disp: 45 tablet, Rfl: 1 .  estradiol (ESTRACE) 1 MG tablet, TAKE 1 TABLET BY MOUTH EVERY DAY, Disp: 90 tablet, Rfl: 1 .  gabapentin (NEURONTIN) 300 MG capsule, Take by mouth 2 (two) times daily. , Disp: , Rfl:  .  ibuprofen (ADVIL) 800 MG tablet, Take 800 mg by mouth every 6 (six) hours as needed. for pain, Disp: , Rfl:  .  losartan (COZAAR) 100 MG tablet, Take 0.5 tablets (50 mg total) by mouth daily., Disp: 90 tablet, Rfl: 3 .  zolpidem (AMBIEN) 10 MG tablet, TAKE 1 TABLET (10 MG TOTAL) BY MOUTH AT BEDTIME AS NEEDED FOR SLEEP., Disp: 30 tablet, Rfl: 3 .  ergocalciferol (VITAMIN D2) 1.25 MG (50000 UT) capsule, ergocalciferol (vitamin D2) 1,250 mcg (50,000 unit) capsule, Disp: , Rfl:  .  estradiol (ESTRACE) 1 MG tablet, Take 1 tablet (1 mg total) by mouth daily. (Patient not taking: Reported on 12/30/2019), Disp: 90 tablet, Rfl: 2 .  hydrochlorothiazide (HYDRODIURIL) 25  MG tablet, TAKE 1 TABLET BY MOUTH EVERY DAY (Patient not taking: Reported on 12/30/2019), Disp: 90 tablet, Rfl: 3  Review of Systems  Constitutional: Negative for appetite change, chills, fatigue and fever.  Eyes: Negative.   Respiratory: Negative for chest tightness and shortness of breath.   Cardiovascular: Negative for chest pain and palpitations.  Gastrointestinal: Negative for abdominal pain, nausea and vomiting.  Allergic/Immunologic: Negative.   Neurological: Negative for dizziness and weakness.  Psychiatric/Behavioral: Negative.     Social History   Tobacco Use  . Smoking status: Never Smoker  . Smokeless tobacco: Never Used  Substance Use Topics  . Alcohol use: No    Alcohol/week: 0.0 standard drinks      Objective:   BP (!) 143/88 (BP Location: Right Arm, Patient Position: Sitting, Cuff Size: Large)   Pulse 93   Temp (!) 97.3 F (36.3 C) (Other (Comment))   Resp 16   Ht 5\' 4"  (1.626 m)   Wt 127 lb (57.6 kg)   SpO2 99%   BMI 21.80 kg/m  Vitals:   12/30/19 1101  BP: (!) 143/88  Pulse: 93  Resp: 16  Temp: (!) 97.3 F (36.3 C)  TempSrc: Other (Comment)  SpO2: 99%  Weight: 127 lb (57.6 kg)  Height: 5\' 4"  (1.626 m)  Body mass index is 21.8 kg/m.   Physical Exam Vitals reviewed.  Constitutional:      Appearance: Normal appearance.  HENT:  Right Ear: External ear normal.     Left Ear: External ear normal.  Eyes:     General: No scleral icterus.    Conjunctiva/sclera: Conjunctivae normal.  Cardiovascular:     Rate and Rhythm: Normal rate.     Pulses: Normal pulses.     Heart sounds: Normal heart sounds.     Comments: LE vascular exam intact. Pulmonary:     Effort: Pulmonary effort is normal.  Abdominal:     Palpations: Abdomen is soft.  Musculoskeletal:        General: No swelling, tenderness or deformity.     Right lower leg: No edema.     Left lower leg: No edema.  Skin:    General: Skin is warm and dry.  Neurological:     General: No  focal deficit present.     Mental Status: She is alert and oriented to person, place, and time.  Psychiatric:        Mood and Affect: Mood normal.        Behavior: Behavior normal.        Thought Content: Thought content normal.        Judgment: Judgment normal.      No results found for any visits on 12/30/19.     Assessment & Plan    1. Essential hypertension Restarted HCTZ.  - hydrochlorothiazide (HYDRODIURIL) 25 MG tablet; Take 1 tablet (25 mg total) by mouth daily.  Dispense: 90 tablet; Refill:  2. Status post lumbar discectomy Improving.  3. Adjustment insomnia Would like to stop sleeping pills in the future.   Follow up in 3-4 months.    I,April Miller,acting as a scribe for Wilhemena Durie, MD.,have documented all relevant documentation on the behalf of Wilhemena Durie, MD,as directed by  Wilhemena Durie, MD while in the presence of Wilhemena Durie, MD.      Wilhemena Durie, MD  [facility]

## 2019-12-30 ENCOUNTER — Encounter: Payer: Self-pay | Admitting: Family Medicine

## 2019-12-30 ENCOUNTER — Ambulatory Visit (INDEPENDENT_AMBULATORY_CARE_PROVIDER_SITE_OTHER): Payer: 59 | Admitting: Family Medicine

## 2019-12-30 ENCOUNTER — Other Ambulatory Visit: Payer: Self-pay

## 2019-12-30 VITALS — BP 143/88 | HR 93 | Temp 97.3°F | Resp 16 | Ht 64.0 in | Wt 127.0 lb

## 2019-12-30 DIAGNOSIS — Z9889 Other specified postprocedural states: Secondary | ICD-10-CM | POA: Diagnosis not present

## 2019-12-30 DIAGNOSIS — I10 Essential (primary) hypertension: Secondary | ICD-10-CM | POA: Diagnosis not present

## 2019-12-30 DIAGNOSIS — F5102 Adjustment insomnia: Secondary | ICD-10-CM | POA: Diagnosis not present

## 2019-12-30 MED ORDER — HYDROCHLOROTHIAZIDE 25 MG PO TABS: 25.0000 mg | ORAL_TABLET | Freq: Every day | ORAL | 3 refills | Status: DC

## 2020-01-25 ENCOUNTER — Other Ambulatory Visit: Payer: Self-pay | Admitting: Family Medicine

## 2020-01-25 DIAGNOSIS — G4700 Insomnia, unspecified: Secondary | ICD-10-CM

## 2020-01-25 NOTE — Telephone Encounter (Signed)
Requested medication (s) are due for refill today: yes  Requested medication (s) are on the active medication list: yes  Last refill:  09/30/19  Future visit scheduled: yes  Notes to clinic:  medication not delegated to NT to refill   Requested Prescriptions  Pending Prescriptions Disp Refills   zolpidem (AMBIEN) 10 MG tablet [Pharmacy Med Name: ZOLPIDEM TARTRATE 10 MG TABLET] 30 tablet 3    Sig: TAKE 1 TABLET BY MOUTH AT BEDTIME AS NEEDED FOR SLEEP.      Not Delegated - Psychiatry:  Anxiolytics/Hypnotics Failed - 01/25/2020 10:38 AM      Failed - This refill cannot be delegated      Failed - Urine Drug Screen completed in last 360 days.      Passed - Valid encounter within last 6 months    Recent Outpatient Visits           3 weeks ago Essential hypertension   [facility] [name]., MD   4 months ago Status post lumbar discectomy   [facility] [name]., MD   8 months ago Low back pain with right-sided sciatica, unspecified back pain laterality, unspecified chronicity   [facility] [name]., MD   9 months ago Sciatica of right side   [facility] [name]., MD   9 months ago Sciatica of right side   [facility] [name]., MD       Future Appointments             In 3 months [name]., MD [facility] [place]   In 5 months [name], [name], MD [facility]   In 6 months [name], [name], MD [facility]

## 2020-03-09 ENCOUNTER — Other Ambulatory Visit: Payer: Self-pay | Admitting: Family Medicine

## 2020-03-09 DIAGNOSIS — I10 Essential (primary) hypertension: Secondary | ICD-10-CM

## 2020-03-09 NOTE — Telephone Encounter (Signed)
Requested medications are due for refill today?  Patient requested - needing new script for 100 mg to cover plan per last visit (12/30/2019) which stated:  "Essential hypertension From 09/11/2019-Controlled on losartan and HCTZ.  Presently will increase losartan to 100 mg with hopes of stopping the HCTZ in the future."  Requested medications are on active medication list?  Yes  Last Refill:  Losartan 50 mg - 09/11/2019  # 90 with 3 refills.     Future visit scheduled?  Yes on 05/04/2020  Notes to Clinic:  Please see above.

## 2020-04-29 NOTE — Progress Notes (Signed)
Established patient visit   Patient: Jill Porter   DOB: 1966-01-29   54 y.o. Female  MRN: [id number] Visit Date: 05/04/2020  Today's healthcare provider: Wilhemena Durie, MD   Chief Complaint  Patient presents with  . Hypertension   Subjective    HPI  Patient feels well but does not check her blood pressure at home.  She has no complaints today. Hypertension, follow-up  BP Readings from Last 3 Encounters:  05/04/20 (!) 142/92  12/30/19 (!) 143/88  10/10/19 (!) 154/92   Wt Readings from Last 3 Encounters:  05/04/20 124 lb (56.2 kg)  12/30/19 127 lb (57.6 kg)  10/10/19 131 lb 14.4 oz (59.8 kg)     She was last seen for hypertension 5 months ago.  BP at that visit was 143/88. Management since that visit includes; Restarted HCTZ. She reports excellent compliance with treatment. She is not having side effects.  She is exercising. She is not adherent to low salt diet.   Outside blood pressures are not being checked.  She does not smoke.  Use of agents associated with hypertension: none.    ---------------------------------------------------------------------------------------------------  Status post lumbar discectomy From 12/30/2019-Improving. Her back pain and leg pain have completely resolved from this.  Adjustment insomnia From 12/30/2019-Would like to stop sleeping pills in the future. She still takes sleeping pills but mostly is better as her children are doing better.  She continues to be followed by dermatology for melanoma and does not go to the tanning bed like she used to.  She is active and stays outside some but does not bake in the sun.  Medications: Outpatient Medications Prior to Visit  Medication Sig  . estradiol (ESTRACE) 1 MG tablet TAKE 1 TABLET BY MOUTH EVERY DAY  . gabapentin (NEURONTIN) 300 MG capsule Take by mouth 2 (two) times daily.   . hydrochlorothiazide (HYDRODIURIL) 25 MG tablet Take 1 tablet (25 mg total) by mouth daily.   Marland Kitchen losartan (COZAAR) 50 MG tablet TAKE 1 TABLET BY MOUTH EVERY DAY  . zolpidem (AMBIEN) 10 MG tablet TAKE 1 TABLET BY MOUTH AT BEDTIME AS NEEDED FOR SLEEP.  Marland Kitchen cyclobenzaprine (FLEXERIL) 10 MG tablet TAKE 1 TABLET BY MOUTH THREE TIMES A DAY AS NEEDED FOR MUSCLE SPASMS  . ergocalciferol (VITAMIN D2) 1.25 MG (50000 UT) capsule ergocalciferol (vitamin D2) 1,250 mcg (50,000 unit) capsule  . ibuprofen (ADVIL) 800 MG tablet Take 800 mg by mouth every 6 (six) hours as needed. for pain  . losartan (COZAAR) 100 MG tablet Take 0.5 tablets (50 mg total) by mouth daily.  . [DISCONTINUED] estradiol (ESTRACE) 1 MG tablet Take 1 tablet (1 mg total) by mouth daily. (Patient not taking: Reported on 12/30/2019)   No facility-administered medications prior to visit.    Review of Systems  Constitutional: Positive for fatigue. Negative for fever.  Respiratory: Negative for cough, shortness of breath and wheezing.   Cardiovascular: Negative for chest pain, palpitations and leg swelling.  Neurological: Negative for dizziness and headaches.       Objective    BP (!) 142/92 (BP Location: Right Arm, Patient Position: Sitting, Cuff Size: Normal)   Pulse 96   Temp 98.2 F (36.8 C) (Oral)   Wt 124 lb (56.2 kg)   SpO2 99%   BMI 21.28 kg/m     Physical Exam Vitals reviewed.  Constitutional:      Appearance: Normal appearance.  HENT:     Right Ear: External ear normal.  Left Ear: External ear normal.  Eyes:     General: No scleral icterus.    Conjunctiva/sclera: Conjunctivae normal.  Cardiovascular:     Rate and Rhythm: Normal rate.     Pulses: Normal pulses.     Heart sounds: Normal heart sounds.  Pulmonary:     Effort: Pulmonary effort is normal.  Abdominal:     Palpations: Abdomen is soft.  Musculoskeletal:        General: No swelling, tenderness or deformity.     Right lower leg: No edema.     Left lower leg: No edema.  Skin:    General: Skin is warm and dry.  Neurological:     General:  No focal deficit present.     Mental Status: She is alert and oriented to person, place, and time.  Psychiatric:        Mood and Affect: Mood normal.        Behavior: Behavior normal.        Thought Content: Thought content normal.        Judgment: Judgment normal.       No results found for any visits on 05/04/20.  Assessment & Plan     Problem List Items Addressed This Visit      Cardiovascular and Mediastinum   Hypertension - Primary    Controlled on losartan and HCTZ        Other   Insomnia (Chronic)    Try to cut back on sleeping pills as some family stress is better. On zolpidem.      Melanoma [facility])    Patient advised to limit her sun exposure and she sees dermatology regularly.      Vitamin D deficiency   Status post lumbar discectomy    Now asymptomatic from lumbar discectomy.  Consider cutting back or stopping gabapentin in the future.        Get blood pressure cuff for home use.  Follow-up 3 months with blood pressure readings   No follow-ups on file.          Kiahna Banghart Cranford Mon, MD  [facility] [phone] (phone) [phone] (fax)  [place]

## 2020-05-04 ENCOUNTER — Other Ambulatory Visit: Payer: Self-pay

## 2020-05-04 ENCOUNTER — Ambulatory Visit (INDEPENDENT_AMBULATORY_CARE_PROVIDER_SITE_OTHER): Payer: 59 | Admitting: Family Medicine

## 2020-05-04 VITALS — BP 142/92 | HR 96 | Temp 98.2°F | Wt 124.0 lb

## 2020-05-04 DIAGNOSIS — I10 Essential (primary) hypertension: Secondary | ICD-10-CM

## 2020-05-04 DIAGNOSIS — Z9889 Other specified postprocedural states: Secondary | ICD-10-CM

## 2020-05-04 DIAGNOSIS — E559 Vitamin D deficiency, unspecified: Secondary | ICD-10-CM

## 2020-05-04 DIAGNOSIS — C439 Malignant melanoma of skin, unspecified: Secondary | ICD-10-CM

## 2020-05-04 DIAGNOSIS — F5102 Adjustment insomnia: Secondary | ICD-10-CM | POA: Diagnosis not present

## 2020-05-04 NOTE — Assessment & Plan Note (Signed)
Controlled on losartan and HCTZ

## 2020-05-04 NOTE — Assessment & Plan Note (Addendum)
Now asymptomatic from lumbar discectomy.  Consider cutting back or stopping gabapentin in the future.

## 2020-05-04 NOTE — Assessment & Plan Note (Signed)
Try to cut back on sleeping pills as some family stress is better. On zolpidem.

## 2020-05-04 NOTE — Assessment & Plan Note (Signed)
Patient advised to limit her sun exposure and she sees dermatology regularly.

## 2020-05-25 ENCOUNTER — Other Ambulatory Visit: Payer: Self-pay | Admitting: Family Medicine

## 2020-05-25 DIAGNOSIS — G4700 Insomnia, unspecified: Secondary | ICD-10-CM

## 2020-05-25 NOTE — Telephone Encounter (Signed)
Requested medication (s) are due for refill today: Yes  Requested medication (s) are on the active medication list: Yes  Last refill:  01/28/20  Future visit scheduled: Yes  Notes to clinic:  See request.    Requested Prescriptions  Pending Prescriptions Disp Refills   zolpidem (AMBIEN) 10 MG tablet [Pharmacy Med Name: ZOLPIDEM TARTRATE 10 MG TABLET] 30 tablet     Sig: TAKE 1 TABLET BY MOUTH EVERY DAY AT BEDTIME AS NEEDED FOR SLEEP      Not Delegated - Psychiatry:  Anxiolytics/Hypnotics Failed - 05/25/2020 12:06 AM      Failed - This refill cannot be delegated      Failed - Urine Drug Screen completed in last 360 days.      Passed - Valid encounter within last 6 months    Recent Outpatient Visits           3 weeks ago Essential hypertension   [facility] [name]., MD   4 months ago Essential hypertension   [facility] [name]., MD   8 months ago Status post lumbar discectomy   [facility] [name]., MD   1 year ago Low back pain with right-sided sciatica, unspecified back pain laterality, unspecified chronicity   [facility] [name]., MD   1 year ago Sciatica of right side   [facility] [name]., MD       Future Appointments             In 1 month [name], [name], MD [facility]   In 2 months [name]., [facility] [place]   In 2 months [name], [name], MD [facility]

## 2020-06-09 ENCOUNTER — Encounter: Payer: Self-pay | Admitting: Family Medicine

## 2020-06-09 ENCOUNTER — Ambulatory Visit (INDEPENDENT_AMBULATORY_CARE_PROVIDER_SITE_OTHER): Payer: 59 | Admitting: Family Medicine

## 2020-06-09 ENCOUNTER — Other Ambulatory Visit: Payer: Self-pay

## 2020-06-09 VITALS — BP 168/109 | HR 106 | Temp 98.4°F | Resp 16 | Wt 125.0 lb

## 2020-06-09 DIAGNOSIS — H1032 Unspecified acute conjunctivitis, left eye: Secondary | ICD-10-CM

## 2020-06-09 MED ORDER — ERYTHROMYCIN 5 MG/GM OP OINT: 1.0000 "application " | TOPICAL_OINTMENT | Freq: Three times a day (TID) | OPHTHALMIC | 0 refills | Status: DC

## 2020-06-09 NOTE — Progress Notes (Signed)
Established patient visit   Patient: Jill Porter   DOB: December 31, 1965   54 y.o. Female  MRN: [id number] Visit Date: 06/09/2020  Today's healthcare provider: Wilhemena Durie, MD   Chief Complaint  Patient presents with  . Eye Pain   Subjective    HPI  She has no known trauma but feels that her left left eye is irritated and aching and feels like something is in it for the past day. Her vision feels like it is blurry. HPI    Eye Pain    Laterality: In left eye   Quality: aching, foreign body sensation, irritation and pain with eye movement   Frequency: constantly   Duration: 1 day   Course: gradually worsening   Associated symptoms: blurred vision, swelling, foreign body sensation, photophobia, discharge, redness and tearing.  Negative for itching and double vision   Treatments tried: eye drops   Response to treatment: no improvement       Last edited by Dorian Pod, CMA on 06/09/2020  3:52 PM. (History)       Patient Active Problem List   Diagnosis Date Noted  . Incomplete tear of rotator cuff 09/11/2019  . Shoulder pain 09/11/2019  . Stiffness of shoulder joint 09/11/2019  . Strain of rotator cuff capsule 09/11/2019  . Disorder of bursae of shoulder region 09/11/2019  . Status post lumbar discectomy 05/07/2019  . Hand weakness 06/26/2018  . Numbness and tingling 05/30/2018  . Bilateral hand pain 04/03/2018  . Diplopia 04/03/2018  . Numbness and tingling in both hands 04/03/2018  . Special screening for malignant neoplasms, colon   . Perimenopausal vasomotor symptoms 06/10/2015  . Status post hysterectomy 06/10/2015  . History of cervical dysplasia 06/10/2015  . History of oophorectomy, unilateral 06/10/2015  . Cholelithiasis 03/31/2015  . Melanoma ([place]) 03/31/2015  . Hx of abnormal cervical Pap smear 03/31/2015  . Tinea versicolor 03/31/2015  . Vitamin D deficiency 03/31/2015  . Hyperlipemia 03/31/2015  . Depression 03/31/2015  . Acute  anxiety 03/31/2015  . Insomnia 03/31/2015  . Hypertension 03/31/2015  . Plantar fasciitis 03/31/2015  . Frequent UTI 03/31/2015  . Discoid eczema 03/31/2015   Social History   Tobacco Use  . Smoking status: Never Smoker  . Smokeless tobacco: Never Used  Vaping Use  . Vaping Use: Never used  Substance Use Topics  . Alcohol use: No    Alcohol/week: 0.0 standard drinks  . Drug use: No   Allergies  Allergen Reactions  . Sulfa Antibiotics Hives  . Penicillins Itching and Rash       Medications: Outpatient Medications Prior to Visit  Medication Sig  . estradiol (ESTRACE) 1 MG tablet TAKE 1 TABLET BY MOUTH EVERY DAY  . gabapentin (NEURONTIN) 300 MG capsule Take by mouth 2 (two) times daily.   Marland Kitchen ibuprofen (ADVIL) 800 MG tablet Take 800 mg by mouth every 6 (six) hours as needed. for pain  . losartan (COZAAR) 100 MG tablet Take 0.5 tablets (50 mg total) by mouth daily.  Marland Kitchen zolpidem (AMBIEN) 10 MG tablet TAKE 1 TABLET BY MOUTH EVERY DAY AT BEDTIME AS NEEDED FOR SLEEP  . [DISCONTINUED] cyclobenzaprine (FLEXERIL) 10 MG tablet TAKE 1 TABLET BY MOUTH THREE TIMES A DAY AS NEEDED FOR MUSCLE SPASMS  . [DISCONTINUED] ergocalciferol (VITAMIN D2) 1.25 MG (50000 UT) capsule ergocalciferol (vitamin D2) 1,250 mcg (50,000 unit) capsule  . [DISCONTINUED] hydrochlorothiazide (HYDRODIURIL) 25 MG tablet Take 1 tablet (25 mg total) by mouth daily. (Patient not  taking: Reported on 06/09/2020)  . [DISCONTINUED] losartan (COZAAR) 50 MG tablet TAKE 1 TABLET BY MOUTH EVERY DAY   No facility-administered medications prior to visit.    Review of Systems  Constitutional: Negative for appetite change.  Eyes: Positive for photophobia, pain, discharge, redness and visual disturbance.  Respiratory: Negative for chest tightness and shortness of breath.   Cardiovascular: Negative for chest pain and palpitations.      Objective    BP (!) 168/109 (BP Location: Right Arm, Patient Position: Sitting, Cuff Size:  Normal)   Pulse (!) 106   Temp 98.4 F (36.9 C) (Oral)   Resp 16   Wt 125 lb (56.7 kg)   BMI 21.46 kg/m  BP Readings from Last 3 Encounters:  06/09/20 (!) 168/109  05/04/20 (!) 142/92  12/30/19 (!) 143/88   Wt Readings from Last 3 Encounters:  06/09/20 125 lb (56.7 kg)  05/04/20 124 lb (56.2 kg)  12/30/19 127 lb (57.6 kg)      Physical Exam Vitals reviewed.  Constitutional:      Appearance: Normal appearance.  Eyes:     General: No scleral icterus.    Extraocular Movements: Extraocular movements intact.     Conjunctiva/sclera: Conjunctivae normal.     Pupils: Pupils are equal, round, and reactive to light.     Comments: Visual fields are intact. There is mild conjunctival irritation of the left eye.  Neurological:     Mental Status: She is alert.       No results found for any visits on 06/09/20.  Assessment & Plan     1. Acute conjunctivitis of left eye, unspecified acute conjunctivitis type We will go ahead and treat with erythromycin gel as a conjunctivitis although I am not sure this is infection. If the discomfort persists beyond another 24 hours have asked her to see ophthalmology. There is no evidence of foreign body in the eye. This is probably a conjunctival abrasion. - erythromycin ophthalmic ointment; Place 1 application into the left eye 3 (three) times daily.  Dispense: 3.5 g; Refill: 0   No follow-ups on file.         Richard Cranford Mon, MD  [facility] [phone] (phone) [phone] (fax)  [place]

## 2020-07-10 ENCOUNTER — Encounter: Payer: 59 | Admitting: Obstetrics and Gynecology

## 2020-07-14 ENCOUNTER — Encounter: Payer: Self-pay | Admitting: Obstetrics and Gynecology

## 2020-07-14 ENCOUNTER — Other Ambulatory Visit (HOSPITAL_COMMUNITY)
Admission: RE | Admit: 2020-07-14 | Discharge: 2020-07-14 | Disposition: A | Discharge status: Home / Self Care | Payer: 59 | Source: Ambulatory Visit | Attending: Obstetrics and Gynecology | Admitting: Obstetrics and Gynecology

## 2020-07-14 ENCOUNTER — Ambulatory Visit (INDEPENDENT_AMBULATORY_CARE_PROVIDER_SITE_OTHER): Payer: 59 | Admitting: Obstetrics and Gynecology

## 2020-07-14 ENCOUNTER — Other Ambulatory Visit: Payer: Self-pay

## 2020-07-14 VITALS — BP 160/93 | HR 99 | Ht 64.0 in | Wt 129.1 lb

## 2020-07-14 DIAGNOSIS — Z87410 Personal history of cervical dysplasia: Secondary | ICD-10-CM | POA: Diagnosis present

## 2020-07-14 DIAGNOSIS — Z79890 Hormone replacement therapy (postmenopausal): Secondary | ICD-10-CM | POA: Diagnosis not present

## 2020-07-14 DIAGNOSIS — N951 Menopausal and female climacteric states: Secondary | ICD-10-CM

## 2020-07-14 DIAGNOSIS — Z01419 Encounter for gynecological examination (general) (routine) without abnormal findings: Secondary | ICD-10-CM

## 2020-07-14 MED ORDER — ESTRADIOL 1 MG PO TABS: 1.0000 mg | ORAL_TABLET | Freq: Every day | ORAL | 3 refills | Status: DC

## 2020-07-14 NOTE — Progress Notes (Signed)
HPI:      [name] is a 54 y.o. [phone] who LMP was No LMP recorded. Patient has had a hysterectomy.  Subjective:   She presents today for her annual examination.  She is taking ERT and doing well with it.  She would like to continue.  She knows that her cholesterol was elevated last year but she has not made any significant dietary or medical changes to decrease it further.  She takes medication for hypertension. Patient's husband recently had infection from gallbladder disease and one of his heart stents is noted to be clogged and he will undergo restenting in the near future.  I say this because she is familiar with elevated cholesterol causing atherosclerosis and the use of statins.    Hx: The following portions of the patient's history were reviewed and updated as appropriate:             She  has a past medical history of Cervical dysphagia, Climacteric, Hypertension, Melanoma ([place]), Motion sickness, Pelvic adhesions, and Recurrent cystitis. She does not have any pertinent problems on file. She  has a past surgical history that includes Cholecystectomy (06/19/2012); Melanoma excision (06/2012); Excision basal cell carcinoma (2013); Breast cyst aspiration (01/2010); [place] surgery (1997); Abdominal hysterectomy (2000); Liposuction; Exploratory laparotomy; Rotator cuff repair; Colonoscopy with propofol (N/A, 07/06/2015); and Parathyroidectomy (04/14/2017). Her family history includes Heart attack (age of onset: 50) in her maternal grandfather; Hypertension in her father and mother. She  reports that she has never smoked. She has never used smokeless tobacco. She reports that she does not drink alcohol and does not use drugs. She has a current medication list which includes the following prescription(s): estradiol, losartan, zolpidem, gabapentin, and ibuprofen. She is allergic to sulfa antibiotics and penicillins.       Review of Systems:  Review of Systems  Constitutional: Denied  constitutional symptoms, night sweats, recent illness, fatigue, fever, insomnia and weight loss.  Eyes: Denied eye symptoms, eye pain, photophobia, vision change and visual disturbance.  Ears/Nose/Throat/Neck: Denied ear, nose, throat or neck symptoms, hearing loss, nasal discharge, sinus congestion and sore throat.  Cardiovascular: Denied cardiovascular symptoms, arrhythmia, chest pain/pressure, edema, exercise intolerance, orthopnea and palpitations.  Respiratory: Denied pulmonary symptoms, asthma, pleuritic pain, productive sputum, cough, dyspnea and wheezing.  Gastrointestinal: Denied, gastro-esophageal reflux, melena, nausea and vomiting.  Genitourinary: Denied genitourinary symptoms including symptomatic vaginal discharge, pelvic relaxation issues, and urinary complaints.  Musculoskeletal: Denied musculoskeletal symptoms, stiffness, swelling, muscle weakness and myalgia.  Dermatologic: Denied dermatology symptoms, rash and scar.  Neurologic: Denied neurology symptoms, dizziness, headache, neck pain and syncope.  Psychiatric: Denied psychiatric symptoms, anxiety and depression.  Endocrine: Denied endocrine symptoms including hot flashes and night sweats.   Meds:   Current Outpatient Medications on File Prior to Visit  Medication Sig Dispense Refill  . losartan (COZAAR) 100 MG tablet Take 0.5 tablets (50 mg total) by mouth daily. 90 tablet 3  . zolpidem (AMBIEN) 10 MG tablet TAKE 1 TABLET BY MOUTH EVERY DAY AT BEDTIME AS NEEDED FOR SLEEP 30 tablet 3  . gabapentin (NEURONTIN) 300 MG capsule Take by mouth 2 (two) times daily.  (Patient not taking: Reported on 07/14/2020)    . ibuprofen (ADVIL) 800 MG tablet Take 800 mg by mouth every 6 (six) hours as needed. for pain (Patient not taking: Reported on 07/14/2020)     No current facility-administered medications on file prior to visit.          Objective:  Vitals:   07/14/20 0856  BP: (!) 160/93  Pulse: 99    Filed Weights    07/14/20 0856  Weight: 129 lb 1.6 oz (58.6 kg)              Physical examination General NAD, Conversant  HEENT Atraumatic; Op clear with mmm.  Normo-cephalic. Pupils reactive. Anicteric sclerae  Thyroid/Neck Smooth without nodularity or enlargement. Normal ROM.  Neck Supple.  Skin No rashes, lesions or ulceration. Normal palpated skin turgor. No nodularity.  Breasts: No masses or discharge.  Symmetric.  No axillary adenopathy.  Bilateral implants surgery  Lungs: Clear to auscultation.No rales or wheezes. Normal Respiratory effort, no retractions.  Heart: NSR.  No murmurs or rubs appreciated. No periferal edema  Abdomen: Soft.  Non-tender.  No masses.  No HSM. No hernia abdominoplasty noted  Extremities: Moves all appropriately.  Normal ROM for age. No lymphadenopathy.  Neuro: Oriented to PPT.  Normal mood. Normal affect.     Pelvic:   Vulva: Normal appearance.  No lesions.   Vagina: No lesions or abnormalities noted.  Support: Normal pelvic support.  Urethra No masses tenderness or scarring.  Meatus Normal size without lesions or prolapse.  Cervix: Surgically absent   Anus: Normal exam.  No lesions.  Perineum: Normal exam.  No lesions.        Bimanual   Uterus: Surgically absent   Adnexae: No masses.  Non-tender to palpation.  Cul-de-sac: Negative for abnormality.     Assessment:    [id number] Patient Active Problem List   Diagnosis Date Noted  . Incomplete tear of rotator cuff 09/11/2019  . Shoulder pain 09/11/2019  . Stiffness of shoulder joint 09/11/2019  . Strain of rotator cuff capsule 09/11/2019  . Disorder of bursae of shoulder region 09/11/2019  . Status post lumbar discectomy 05/07/2019  . Hand weakness 06/26/2018  . Numbness and tingling 05/30/2018  . Bilateral hand pain 04/03/2018  . Diplopia 04/03/2018  . Numbness and tingling in both hands 04/03/2018  . Special screening for malignant neoplasms, colon   . Perimenopausal vasomotor symptoms 06/10/2015  .  Status post hysterectomy 06/10/2015  . History of cervical dysplasia 06/10/2015  . History of oophorectomy, unilateral 06/10/2015  . Cholelithiasis 03/31/2015  . Melanoma ([place]) 03/31/2015  . Hx of abnormal cervical Pap smear 03/31/2015  . Tinea versicolor 03/31/2015  . Vitamin D deficiency 03/31/2015  . Hyperlipemia 03/31/2015  . Depression 03/31/2015  . Acute anxiety 03/31/2015  . Insomnia 03/31/2015  . Hypertension 03/31/2015  . Plantar fasciitis 03/31/2015  . Frequent UTI 03/31/2015  . Discoid eczema 03/31/2015     1. Well woman exam with routine gynecological exam   2. Symptomatic menopausal or female climacteric states   3. Hormone replacement therapy (HRT)   4. History of cervical dysplasia        Plan:            1.  Basic Screening Recommendations The basic screening recommendations for asymptomatic women were discussed with the patient during her visit.  The age-appropriate recommendations were discussed with her and the rational for the tests reviewed.  When I am informed by the patient that another primary care physician has previously obtained the age-appropriate tests and they are up-to-date, only outstanding tests are ordered and referrals given as necessary.  Abnormal results of tests will be discussed with her when all of her results are completed.  Routine preventative health maintenance measures emphasized: Exercise/Diet/Weight control, Tobacco Warnings, Alcohol/Substance use risks and Stress Management  Pap of the vaginal cuff performed -we have discussed this in some detail and I recommended that if this Pap is normal she may go 2 years before her next Pap and if that is normal we will then go to 3 years and if that is normal patient can stop getting Pap smears of the vaginal cuff.  She understands and agrees with this plan 2.  Discussed possible use of statins for elevated cholesterol and patient will check with PCP. 3.  We have discussed the possibility of DEXA  scan and patient not at high risk for osteoporosis because of her activity, and her use of estrogen.  We will consider this in the future if she so desires.  Use of daily calcium and vitamin D recommended. 4.  Patient doing very well on ERT.  We will continue Estrace.  She notes hot flashes are much improved and no further vaginal issues. Orders Orders Placed This Encounter  Procedures  . Hemoglobin A1c  . Lipid panel  . TSH     Meds ordered this encounter  Medications  . estradiol (ESTRACE) 1 MG tablet    Sig: Take 1 tablet (1 mg total) by mouth daily.    Dispense:  90 tablet    Refill:  3            F/U  Return in about 1 year (around 07/14/2021) for Annual Physical.  Finis Bud, M.D. 07/14/2020 9:30 AM

## 2020-07-14 NOTE — Addendum Note (Signed)
Addended by: Durwin Glaze on: 07/14/2020 10:16 AM   Modules accepted: Orders

## 2020-07-15 ENCOUNTER — Other Ambulatory Visit: Payer: Self-pay

## 2020-07-15 ENCOUNTER — Other Ambulatory Visit: Payer: 59

## 2020-07-15 LAB — CYTOLOGY - PAP
Comment: NEGATIVE
Diagnosis: NEGATIVE
High risk HPV: NEGATIVE

## 2020-07-16 LAB — LIPID PANEL
Chol/HDL Ratio: 4 ratio (ref 0.0–4.4)
Cholesterol, Total: 242 mg/dL — ABNORMAL HIGH (ref 100–199)
HDL: 61 mg/dL (ref >39)
LDL Chol Calc (NIH): 140 mg/dL — ABNORMAL HIGH (ref 0–99)
Triglycerides: 230 mg/dL — ABNORMAL HIGH (ref 0–149)
VLDL Cholesterol Cal: 41 mg/dL — ABNORMAL HIGH (ref 5–40)

## 2020-07-16 LAB — HEMOGLOBIN A1C
Est. average glucose Bld gHb Est-mCnc: 103 mg/dL
Hgb A1c MFr Bld: 5.2 % (ref 4.8–5.6)

## 2020-07-16 LAB — TSH: TSH: 0.983 u[IU]/mL (ref 0.450–4.500)

## 2020-07-21 ENCOUNTER — Telehealth: Payer: Self-pay

## 2020-07-21 NOTE — Telephone Encounter (Signed)
Informed patient of abnormal lipids per Dr [name]. States she has an appointment with her PCP in November.

## 2020-08-05 NOTE — Progress Notes (Signed)
I,April Miller,acting as a scribe for Wilhemena Durie, MD.,have documented all relevant documentation on the behalf of Wilhemena Durie, MD,as directed by  Wilhemena Durie, MD while in the presence of Wilhemena Durie, MD.   Established patient visit   Patient: Jill Porter   DOB: 12/06/1965   54 y.o. Female  MRN: [id number] Visit Date: 08/06/2020  Today's healthcare provider: Wilhemena Durie, MD   Chief Complaint  Patient presents with  . Follow-up  . Hypertension   Subjective    HPI  Patient is actually stable and doing well.  She is having some occasional constipation. Hypertension, follow-up  BP Readings from Last 3 Encounters:  08/06/20 128/82  07/14/20 (!) 160/93  06/09/20 (!) 168/109   Wt Readings from Last 3 Encounters:  08/06/20 126 lb (57.2 kg)  07/14/20 129 lb 1.6 oz (58.6 kg)  06/09/20 125 lb (56.7 kg)     She was last seen for hypertension 3 months ago.  BP at that visit was 142/92. Management since that visit includes; controlled on losartan and HCTZ. She reports good compliance with treatment. She is not having side effects. nonwe She is not exercising. She is not adherent to low salt diet.   Outside blood pressures are 168/90.  She does not smoke.  Use of agents associated with hypertension: none.   --------------------------------------------------------------------       Medications: Outpatient Medications Prior to Visit  Medication Sig  . estradiol (ESTRACE) 1 MG tablet Take 1 tablet (1 mg total) by mouth daily.  Marland Kitchen losartan (COZAAR) 100 MG tablet Take 0.5 tablets (50 mg total) by mouth daily.  Marland Kitchen zolpidem (AMBIEN) 10 MG tablet TAKE 1 TABLET BY MOUTH EVERY DAY AT BEDTIME AS NEEDED FOR SLEEP  . gabapentin (NEURONTIN) 300 MG capsule Take by mouth 2 (two) times daily.  (Patient not taking: Reported on 07/14/2020)  . ibuprofen (ADVIL) 800 MG tablet Take 800 mg by mouth every 6 (six) hours as needed. for pain (Patient not  taking: Reported on 07/14/2020)   No facility-administered medications prior to visit.    Review of Systems  Constitutional: Negative for appetite change, chills, fatigue and fever.  Respiratory: Negative for chest tightness and shortness of breath.   Cardiovascular: Negative for chest pain and palpitations.  Gastrointestinal: Negative for abdominal pain, nausea and vomiting.  Neurological: Negative for dizziness and weakness.       Objective    BP 128/82 (BP Location: Right Arm, Patient Position: Sitting, Cuff Size: Normal)   Pulse 97   Temp 98.2 F (36.8 C) (Oral)   Resp 16   Ht 5\' 4"  (1.626 m)   Wt 126 lb (57.2 kg)   SpO2 100%   BMI 21.63 kg/m  BP Readings from Last 3 Encounters:  08/06/20 128/82  07/14/20 (!) 160/93  06/09/20 (!) 168/109   Wt Readings from Last 3 Encounters:  08/06/20 126 lb (57.2 kg)  07/14/20 129 lb 1.6 oz (58.6 kg)  06/09/20 125 lb (56.7 kg)      Physical Exam Vitals reviewed.  Constitutional:      Appearance: Normal appearance.  HENT:     Right Ear: External ear normal.     Left Ear: External ear normal.  Eyes:     General: No scleral icterus.    Conjunctiva/sclera: Conjunctivae normal.  Cardiovascular:     Rate and Rhythm: Normal rate.     Pulses: Normal pulses.     Heart sounds: Normal heart sounds.  Pulmonary:     Effort: Pulmonary effort is normal.  Abdominal:     Palpations: Abdomen is soft.  Musculoskeletal:        General: No swelling, tenderness or deformity.     Right lower leg: No edema.     Left lower leg: No edema.  Skin:    General: Skin is warm and dry.  Neurological:     General: No focal deficit present.     Mental Status: She is alert and oriented to person, place, and time.  Psychiatric:        Mood and Affect: Mood normal.        Behavior: Behavior normal.        Thought Content: Thought content normal.        Judgment: Judgment normal.       No results found for any visits on 08/06/20.  Assessment  & Plan     1. Primary hypertension Good control losartan  2. Vitamin D deficiency   3. Recurrent major depressive disorder, in full remission ([place]) In remission now on no medications.  4. Malignant melanoma, unspecified site [facility]) Needs to stay out of the sun.  5. Status post lumbar discectomy   6. Chronic constipation Metamucil daily   Return in about 6 months (around 02/03/2021) for CPE.         Gabino Hagin Cranford Mon, MD  [facility] [phone] (phone) [phone] (fax)  [place]

## 2020-08-06 ENCOUNTER — Ambulatory Visit (INDEPENDENT_AMBULATORY_CARE_PROVIDER_SITE_OTHER): Payer: 59 | Admitting: Family Medicine

## 2020-08-06 ENCOUNTER — Other Ambulatory Visit: Payer: Self-pay

## 2020-08-06 ENCOUNTER — Encounter: Payer: Self-pay | Admitting: Family Medicine

## 2020-08-06 VITALS — BP 128/82 | HR 97 | Temp 98.2°F | Resp 16 | Ht 64.0 in | Wt 126.0 lb

## 2020-08-06 DIAGNOSIS — F3342 Major depressive disorder, recurrent, in full remission: Secondary | ICD-10-CM | POA: Diagnosis not present

## 2020-08-06 DIAGNOSIS — E559 Vitamin D deficiency, unspecified: Secondary | ICD-10-CM

## 2020-08-06 DIAGNOSIS — C439 Malignant melanoma of skin, unspecified: Secondary | ICD-10-CM | POA: Diagnosis not present

## 2020-08-06 DIAGNOSIS — Z9889 Other specified postprocedural states: Secondary | ICD-10-CM

## 2020-08-06 DIAGNOSIS — K5909 Other constipation: Secondary | ICD-10-CM

## 2020-08-06 DIAGNOSIS — I10 Essential (primary) hypertension: Secondary | ICD-10-CM

## 2020-08-06 NOTE — Patient Instructions (Signed)
Try Metamucil daily 

## 2020-08-11 ENCOUNTER — Encounter: Payer: 59 | Admitting: Obstetrics and Gynecology

## 2020-09-17 ENCOUNTER — Other Ambulatory Visit: Payer: Self-pay | Admitting: Family Medicine

## 2020-09-17 DIAGNOSIS — I10 Essential (primary) hypertension: Secondary | ICD-10-CM

## 2020-09-18 ENCOUNTER — Other Ambulatory Visit: Payer: Self-pay | Admitting: Family Medicine

## 2020-09-18 DIAGNOSIS — G4700 Insomnia, unspecified: Secondary | ICD-10-CM

## 2020-09-18 NOTE — Telephone Encounter (Signed)
Requested medication (s) are due for refill today - yes  Requested medication (s) are on the active medication list -yes  Future visit scheduled -yes  Last refill: 08/23/20  Notes to clinic: Request for non delegated rx  Requested Prescriptions  Pending Prescriptions Disp Refills   zolpidem (AMBIEN) 10 MG tablet [Pharmacy Med Name: ZOLPIDEM TARTRATE 10 MG TABLET] 30 tablet     Sig: TAKE 1 TABLET BY MOUTH AT BEDTIME AS NEEDED FOR SLEEP      Not Delegated - Psychiatry:  Anxiolytics/Hypnotics Failed - 09/18/2020  3:58 PM      Failed - This refill cannot be delegated      Failed - Urine Drug Screen completed in last 360 days      Passed - Valid encounter within last 6 months    Recent Outpatient Visits           1 month ago Primary hypertension   [facility] [name]., MD   3 months ago Acute conjunctivitis of left eye, unspecified acute conjunctivitis type   [facility] [name]., MD   4 months ago Essential hypertension   [facility] [name]., MD   8 months ago Essential hypertension   [facility] [name]., MD   1 year ago Status post lumbar discectomy   [facility] [name]., MD       Future Appointments             In 5 months [name]., MD [facility]   In 10 months [name], [name], MD [facility]                 Requested Prescriptions  Pending Prescriptions Disp Refills   zolpidem (AMBIEN) 10 MG tablet [Pharmacy Med Name: ZOLPIDEM TARTRATE 10 MG TABLET] 30 tablet     Sig: TAKE 1 TABLET BY MOUTH AT BEDTIME AS NEEDED FOR SLEEP      Not Delegated - Psychiatry:  Anxiolytics/Hypnotics Failed - 09/18/2020  3:58 PM      Failed - This refill cannot be delegated      Failed - Urine Drug Screen completed in last 360 days      Passed - Valid encounter within last 6 months     Recent Outpatient Visits           1 month ago Primary hypertension   [facility] [name]., MD   3 months ago Acute conjunctivitis of left eye, unspecified acute conjunctivitis type   [facility] [name]., MD   4 months ago Essential hypertension   [facility] [name]., MD   8 months ago Essential hypertension   [facility] [name]., MD   1 year ago Status post lumbar discectomy   [facility] [name]., MD       Future Appointments             In 5 months [name]., MD [facility] [place]   In 10 months [name], [name], MD [facility]

## 2020-10-12 ENCOUNTER — Other Ambulatory Visit: Payer: 59

## 2020-10-12 DIAGNOSIS — Z20822 Contact with and (suspected) exposure to covid-19: Secondary | ICD-10-CM

## 2020-10-13 LAB — NOVEL CORONAVIRUS, NAA: SARS-CoV-2, NAA: NOT DETECTED

## 2020-10-13 LAB — SARS-COV-2, NAA 2 DAY TAT

## 2020-10-21 ENCOUNTER — Other Ambulatory Visit: Payer: Self-pay

## 2020-10-21 ENCOUNTER — Other Ambulatory Visit: Payer: 59

## 2020-10-21 DIAGNOSIS — Z20822 Contact with and (suspected) exposure to covid-19: Secondary | ICD-10-CM

## 2020-10-22 ENCOUNTER — Other Ambulatory Visit: Payer: 59

## 2020-10-23 LAB — SARS-COV-2, NAA 2 DAY TAT

## 2020-10-23 LAB — NOVEL CORONAVIRUS, NAA: SARS-CoV-2, NAA: NOT DETECTED

## 2020-12-30 ENCOUNTER — Other Ambulatory Visit: Payer: Self-pay | Admitting: Family Medicine

## 2020-12-30 DIAGNOSIS — I10 Essential (primary) hypertension: Secondary | ICD-10-CM

## 2020-12-30 NOTE — Telephone Encounter (Signed)
Requested medications are due for refill today.  unknown  Requested medications are on the active medications list.  no  Last refill. 09/23/2020  Future visit scheduled.   yes  Notes to clinic.  Medication d/c 06/09/2020

## 2021-01-15 ENCOUNTER — Other Ambulatory Visit: Payer: Self-pay | Admitting: Family Medicine

## 2021-01-15 DIAGNOSIS — I10 Essential (primary) hypertension: Secondary | ICD-10-CM

## 2021-02-11 ENCOUNTER — Other Ambulatory Visit: Payer: Self-pay | Admitting: Family Medicine

## 2021-02-11 DIAGNOSIS — G4700 Insomnia, unspecified: Secondary | ICD-10-CM

## 2021-02-11 NOTE — Telephone Encounter (Signed)
Requested medications are due for refill today yes  Requested medications are on the active medication list yes  Last refill 01/15/21  Last visit 05/2020  Future visit scheduled 02/15/21  Notes to clinic Not Delegated.

## 2021-02-12 NOTE — Progress Notes (Signed)
I,April Miller,acting as a scribe for Wilhemena Durie, MD.,have documented all relevant documentation on the behalf of Wilhemena Durie, MD,as directed by  Wilhemena Durie, MD while in the presence of Wilhemena Durie, MD.   Complete physical exam   Patient: Jill Porter   DOB: 15-May-1966   55 y.o. Female  MRN: [id number] Visit Date: 02/15/2021  Today's healthcare provider: Wilhemena Durie, MD   Chief Complaint  Patient presents with  . Annual Exam   Subjective    Jill Porter is a 54 y.o. female who presents today for a complete physical exam.  She reports consuming a general diet. Home exercise routine includes walking. She generally feels well. She reports sleeping poorly. She does not have additional problems to discuss today.  HPI  Patient has worsening low back pain recently.  She has had surgical intervention in the past couple of years.  She also complains of bilateral hand pain.  She has some swelling in the DIP and PIP joints. She is having some mild urinary symptoms recently. She sees Dr. [name] for her well woman exam.  Past Medical History:  Diagnosis Date  . Cervical dysphagia   . Climacteric   . Hypertension   . Melanoma ([place])   . Motion sickness    anytime moving  . Pelvic adhesions   . Recurrent cystitis    Past Surgical History:  Procedure Laterality Date  . ABDOMINAL HYSTERECTOMY  2000   due to pre cancerous cells  . BASAL CELL CARCINOMA EXCISION  2013   removed from forehead and Lt [place]  . BREAST CYST ASPIRATION  01/2010  . CHOLECYSTECTOMY  06/19/2012  . COLONOSCOPY WITH PROPOFOL N/A 07/06/2015   Procedure: COLONOSCOPY WITH PROPOFOL;  Surgeon: [name], MD;  Location: [facility];  Service: Endoscopy;  Laterality: N/A;  . EXPLORATORY LAPAROTOMY     lysis of adhesions  . LIPOSUCTION    . MELANOMA EXCISION  06/2012   Removed from Rt shoulder  . OVARY SURGERY  1997   hx of removal of ovary and adhesions  .  PARATHYROIDECTOMY  04/14/2017  . ROTATOR CUFF REPAIR     Social History   Socioeconomic History  . Marital status: Married    Spouse name: Not on file  . Number of children: Not on file  . Years of education: Not on file  . Highest education level: Not on file  Occupational History  . Not on file  Tobacco Use  . Smoking status: Never Smoker  . Smokeless tobacco: Never Used  Vaping Use  . Vaping Use: Never used  Substance and Sexual Activity  . Alcohol use: No    Alcohol/week: 0.0 standard drinks  . Drug use: No  . Sexual activity: Yes    Birth control/protection: Surgical  Other Topics Concern  . Not on file  Social History Narrative  . Not on file   Social Determinants of Health   Financial Resource Strain: Not on file  Food Insecurity: Not on file  Transportation Needs: Not on file  Physical Activity: Not on file  Stress: Not on file  Social Connections: Not on file  Intimate Partner Violence: Not on file   Family Status  Relation Name Status  . Mother  Alive  . Father  Alive  . Other  (Not Specified)       Died from MI  . Brother  Alive  . MGF  Deceased  . Neg Hx  (  Not Specified)   Family History  Problem Relation Age of Onset  . Hypertension Mother   . Hypertension Father   . Heart attack Maternal Grandfather 52       MI  . Cancer Neg Hx   . Diabetes Neg Hx   . Heart disease Neg Hx   . Kidney cancer Neg Hx   . Bladder Cancer Neg Hx    Allergies  Allergen Reactions  . Sulfa Antibiotics Hives  . Penicillins Itching and Rash    Patient Care Team: [name]., MD as PCP - General (Family Medicine) Rockey Situ Kathlene November, MD as Consulting Physician (Cardiology)   Medications: Outpatient Medications Prior to Visit  Medication Sig  . estradiol (ESTRACE) 1 MG tablet Take 1 tablet (1 mg total) by mouth daily.  . hydrochlorothiazide (HYDRODIURIL) 25 MG tablet TAKE 1 TABLET BY MOUTH EVERY DAY  . losartan (COZAAR) 100 MG tablet TAKE 1/2 TABLET  BY MOUTH EVERY DAY  . zolpidem (AMBIEN) 10 MG tablet TAKE 1 TABLET BY MOUTH AT BEDTIME AS NEEDED FOR SLEEP  . gabapentin (NEURONTIN) 300 MG capsule Take by mouth 2 (two) times daily.  (Patient not taking: No sig reported)  . ibuprofen (ADVIL) 800 MG tablet Take 800 mg by mouth every 6 (six) hours as needed. for pain (Patient not taking: No sig reported)   No facility-administered medications prior to visit.    Review of Systems  Genitourinary: Positive for frequency.  Musculoskeletal: Positive for arthralgias and back pain.  All other systems reviewed and are negative.      Objective    BP (!) 143/85 (BP Location: Left Arm, Patient Position: Sitting, Cuff Size: Normal)   Pulse 90   Temp 98.2 F (36.8 C) (Oral)   Resp 16   Ht 5\' 4"  (1.626 m)   Wt 124 lb (56.2 kg)   SpO2 97%   BMI 21.28 kg/m  BP Readings from Last 3 Encounters:  02/15/21 (!) 143/85  08/06/20 128/82  07/14/20 (!) 160/93   Wt Readings from Last 3 Encounters:  02/15/21 124 lb (56.2 kg)  08/06/20 126 lb (57.2 kg)  07/14/20 129 lb 1.6 oz (58.6 kg)      Physical Exam Vitals reviewed.  Constitutional:      Appearance: She is well-developed.  HENT:     Head: Normocephalic and atraumatic.     Right Ear: External ear normal.     Left Ear: External ear normal.     Nose: Nose normal.  Eyes:     Conjunctiva/sclera: Conjunctivae normal.     Pupils: Pupils are equal, round, and reactive to light.  Cardiovascular:     Rate and Rhythm: Normal rate and regular rhythm.     Heart sounds: Normal heart sounds.  Pulmonary:     Effort: Pulmonary effort is normal.     Breath sounds: Normal breath sounds.  Abdominal:     General: Bowel sounds are normal.     Palpations: Abdomen is soft.  Musculoskeletal:     Cervical back: Normal range of motion and neck supple.     Comments: Mild swelling of the DIP and PIP joints of the hands  Skin:    General: Skin is warm and dry.     Comments: Tanned skin.  Neurological:      Mental Status: She is alert and oriented to person, place, and time. Mental status is at baseline.  Psychiatric:        Mood and Affect: Mood normal.  Behavior: Behavior normal.        Thought Content: Thought content normal.        Judgment: Judgment normal.       Last depression screening scores PHQ 2/9 Scores 02/15/2021 08/06/2020 03/12/2019  PHQ - 2 Score 0 1 1  PHQ- 9 Score 3 8 6    Last fall risk screening Fall Risk  06/27/2018  Falls in the past year? No   Last Audit-C alcohol use screening Alcohol Use Disorder Test (AUDIT) 02/15/2021  1. How often do you have a drink containing alcohol? 0  2. How many drinks containing alcohol do you have on a typical day when you are drinking? 0  3. How often do you have six or more drinks on one occasion? 0  AUDIT-C Score 0  Alcohol Brief Interventions/Follow-up -   A score of 3 or more in women, and 4 or more in men indicates increased risk for alcohol abuse, EXCEPT if all of the points are from question 1   Results for orders placed or performed in visit on 02/15/21  POCT urinalysis dipstick  Result Value Ref Range   Color, UA Dark Yellow    Clarity, UA Clear    Glucose, UA Negative Negative   Bilirubin, UA Small    Ketones, UA Small    Spec Grav, UA 1.020 1.010 - 1.025   Blood, UA Negative    pH, UA 6.5 5.0 - 8.0   Protein, UA Positive (A) Negative   Urobilinogen, UA 0.2 0.2 or 1.0 E.U./dL   Nitrite, UA Negative    Leukocytes, UA Negative Negative    Assessment & Plan    Routine Health Maintenance and Physical Exam  Exercise Activities and Dietary recommendations Goals   None     Immunization History  Administered Date(s) Administered  . Influenza Inj Mdck Quad Pf 07/20/2017  . Influenza,inj,Quad PF,6+ Mos 06/10/2015, 06/25/2018, 07/09/2019, 06/22/2020  . Influenza-Unspecified 12/01/2017  . PFIZER(Purple Top)SARS-COV-2 Vaccination 07/23/2020  . Tdap 03/18/2013  . Zoster Recombinat (Shingrix) 09/23/2017,  03/01/2018    Health Maintenance  Topic Date Due  . Hepatitis C Screening  Never done  . COVID-19 Vaccine (2 - Pfizer risk 4-dose series) 08/13/2020  . INFLUENZA VACCINE  05/03/2021  . MAMMOGRAM  07/13/2022  . TETANUS/TDAP  03/19/2023  . PAP SMEAR-Modifier  07/15/2023  . COLONOSCOPY (Pts 45-70yrs Insurance coverage will need to be confirmed)  07/05/2025  . HIV Screening  Completed  . HPV VACCINES  Aged Out    Discussed health benefits of physical activity, and encouraged her to engage in regular exercise appropriate for her age and condition.  1. Annual physical exam Well woman exam per Dr. [name] - CBC with Differential/Platelet - Comprehensive metabolic panel - Lipid panel - TSH  2. Primary hypertension  - CBC with Differential/Platelet - Comprehensive metabolic panel - Lipid panel - TSH  3. Hyperlipidemia, unspecified hyperlipidemia type  - CBC with Differential/Platelet - Comprehensive metabolic panel - Lipid panel - TSH  4.  History of melanoma.  - CBC with Differential/Platelet - Comprehensive metabolic panel - Lipid panel - TSH  5. Screening for blood or protein in urine  - POCT urinalysis dipstick  6. Urine frequency Push fluids and try cranberry juice daily - POCT urinalysis dipstick  7. Low back pain with right-sided sciatica, unspecified back pain laterality, unspecified chronicity Refer back to her neurosurgeon. - Ambulatory referral to Neurosurgery   Return in about 6 months (around 08/18/2021).     Jill Porter  Cranford Mon, MD, have reviewed all documentation for this visit. The documentation on 02/22/21 for the exam, diagnosis, procedures, and orders are all accurate and complete.    Taris Galindo Cranford Mon, MD  [facility] [phone] (phone) [phone] (fax)  [place]

## 2021-02-15 ENCOUNTER — Ambulatory Visit (INDEPENDENT_AMBULATORY_CARE_PROVIDER_SITE_OTHER): Payer: 59 | Admitting: Family Medicine

## 2021-02-15 ENCOUNTER — Encounter: Payer: Self-pay | Admitting: Family Medicine

## 2021-02-15 ENCOUNTER — Other Ambulatory Visit: Payer: Self-pay

## 2021-02-15 VITALS — BP 143/85 | HR 90 | Temp 98.2°F | Resp 16 | Ht 64.0 in | Wt 124.0 lb

## 2021-02-15 DIAGNOSIS — D351 Benign neoplasm of parathyroid gland: Secondary | ICD-10-CM

## 2021-02-15 DIAGNOSIS — C439 Malignant melanoma of skin, unspecified: Secondary | ICD-10-CM

## 2021-02-15 DIAGNOSIS — M5441 Lumbago with sciatica, right side: Secondary | ICD-10-CM

## 2021-02-15 DIAGNOSIS — I10 Essential (primary) hypertension: Secondary | ICD-10-CM | POA: Diagnosis not present

## 2021-02-15 DIAGNOSIS — E785 Hyperlipidemia, unspecified: Secondary | ICD-10-CM

## 2021-02-15 DIAGNOSIS — Z1389 Encounter for screening for other disorder: Secondary | ICD-10-CM | POA: Diagnosis not present

## 2021-02-15 DIAGNOSIS — R350 Frequency of micturition: Secondary | ICD-10-CM | POA: Diagnosis not present

## 2021-02-15 DIAGNOSIS — Z0000 Encounter for general adult medical examination without abnormal findings: Secondary | ICD-10-CM

## 2021-02-15 LAB — POCT URINALYSIS DIPSTICK
Blood, UA: NEGATIVE
Glucose, UA: NEGATIVE
Leukocytes, UA: NEGATIVE
Nitrite, UA: NEGATIVE
Protein, UA: POSITIVE — AB
Spec Grav, UA: 1.02 (ref 1.010–1.025)
Urobilinogen, UA: 0.2 E.U./dL
pH, UA: 6.5 (ref 5.0–8.0)

## 2021-02-15 NOTE — Patient Instructions (Signed)
Push fluids, cranberry juice and AZO for urine symptoms. Try over-the-counter Turmeric and Voltaren Gel for hand pain.

## 2021-02-16 LAB — COMPREHENSIVE METABOLIC PANEL
ALT: 19 IU/L (ref 0–32)
AST: 20 IU/L (ref 0–40)
Albumin/Globulin Ratio: 1.6 (ref 1.2–2.2)
Albumin: 4.2 g/dL (ref 3.8–4.9)
Alkaline Phosphatase: 63 IU/L (ref 44–121)
BUN/Creatinine Ratio: 10 (ref 9–23)
BUN: 10 mg/dL (ref 6–24)
Bilirubin Total: 0.3 mg/dL (ref 0.0–1.2)
CO2: 26 mmol/L (ref 20–29)
Calcium: 9.4 mg/dL (ref 8.7–10.2)
Chloride: 100 mmol/L (ref 96–106)
Creatinine, Ser: 1.03 mg/dL — ABNORMAL HIGH (ref 0.57–1.00)
Globulin, Total: 2.7 g/dL (ref 1.5–4.5)
Glucose: 96 mg/dL (ref 65–99)
Potassium: 4.1 mmol/L (ref 3.5–5.2)
Sodium: 138 mmol/L (ref 134–144)
Total Protein: 6.9 g/dL (ref 6.0–8.5)
eGFR: 64 mL/min/{1.73_m2} (ref >59)

## 2021-02-16 LAB — CBC WITH DIFFERENTIAL/PLATELET
Basophils Absolute: 0.1 10*3/uL (ref 0.0–0.2)
Basos: 1 %
EOS (ABSOLUTE): 0.1 10*3/uL (ref 0.0–0.4)
Eos: 1 %
Hematocrit: 40.6 % (ref 34.0–46.6)
Hemoglobin: 13.3 g/dL (ref 11.1–15.9)
Immature Grans (Abs): 0 10*3/uL (ref 0.0–0.1)
Immature Granulocytes: 0 %
Lymphocytes Absolute: 1.8 10*3/uL (ref 0.7–3.1)
Lymphs: 35 %
MCH: 28.4 pg (ref 26.6–33.0)
MCHC: 32.8 g/dL (ref 31.5–35.7)
MCV: 87 fL (ref 79–97)
Monocytes Absolute: 0.4 10*3/uL (ref 0.1–0.9)
Monocytes: 7 %
Neutrophils Absolute: 2.8 10*3/uL (ref 1.4–7.0)
Neutrophils: 56 %
Platelets: 245 10*3/uL (ref 150–450)
RBC: 4.68 x10E6/uL (ref 3.77–5.28)
RDW: 12.8 % (ref 11.7–15.4)
WBC: 5.1 10*3/uL (ref 3.4–10.8)

## 2021-02-16 LAB — TSH: TSH: 0.803 u[IU]/mL (ref 0.450–4.500)

## 2021-02-16 LAB — LIPID PANEL
Chol/HDL Ratio: 4.2 ratio (ref 0.0–4.4)
Cholesterol, Total: 253 mg/dL — ABNORMAL HIGH (ref 100–199)
HDL: 60 mg/dL (ref >39)
LDL Chol Calc (NIH): 157 mg/dL — ABNORMAL HIGH (ref 0–99)
Triglycerides: 199 mg/dL — ABNORMAL HIGH (ref 0–149)
VLDL Cholesterol Cal: 36 mg/dL (ref 5–40)

## 2021-03-14 ENCOUNTER — Other Ambulatory Visit: Payer: Self-pay | Admitting: Family Medicine

## 2021-03-14 DIAGNOSIS — I10 Essential (primary) hypertension: Secondary | ICD-10-CM

## 2021-03-14 NOTE — Telephone Encounter (Signed)
Losartan 50 mg dc'd 06/09/20 Losartan 100 mg refilled today 03/14/21

## 2021-03-14 NOTE — Telephone Encounter (Signed)
Requested Prescriptions  Pending Prescriptions Disp Refills  . losartan (COZAAR) 100 MG tablet [Pharmacy Med Name: LOSARTAN POTASSIUM 100 MG TAB] 45 tablet 1    Sig: TAKE 1/2 TABLET BY MOUTH EVERY DAY     Cardiovascular:  Angiotensin Receptor Blockers Failed - 03/14/2021  9:42 AM      Failed - Cr in normal range and within 180 days    Creatinine  Date Value Ref Range Status  06/19/2012 0.90 0.60 - 1.30 mg/dL Final   Creatinine, Ser  Date Value Ref Range Status  02/15/2021 1.03 (H) 0.57 - 1.00 mg/dL Final         Failed - Last BP in normal range    BP Readings from Last 1 Encounters:  02/15/21 (!) 143/85         Passed - K in normal range and within 180 days    Potassium  Date Value Ref Range Status  02/15/2021 4.1 3.5 - 5.2 mmol/L Final  06/19/2012 3.9 3.5 - 5.1 mmol/L Final         Passed - Patient is not pregnant      Passed - Valid encounter within last 6 months    Recent Outpatient Visits          3 weeks ago Annual physical exam   [facility] [name]., MD   7 months ago Primary hypertension   [facility] [name]., MD   9 months ago Acute conjunctivitis of left eye, unspecified acute conjunctivitis type   [facility] [name]., MD   10 months ago Essential hypertension   [facility] [name]., MD   1 year ago Essential hypertension   [facility] [name]., MD      Future Appointments            In 4 months [name], [name], MD [facility]   In 5 months [name]., MD [facility] [place]

## 2021-04-14 ENCOUNTER — Other Ambulatory Visit: Payer: Self-pay | Admitting: Family Medicine

## 2021-04-14 DIAGNOSIS — I10 Essential (primary) hypertension: Secondary | ICD-10-CM

## 2021-04-14 NOTE — Telephone Encounter (Signed)
Notes to clinic:  The original prescription was discontinued on 06/09/2020 by Dorian Pod, CMA for the following reason: Error. Renewing this prescription may not be appropriate   Requested Prescriptions  Pending Prescriptions Disp Refills   losartan (COZAAR) 50 MG tablet [Pharmacy Med Name: LOSARTAN POTASSIUM 50 MG TAB] 90 tablet 3    Sig: TAKE 1 TABLET BY MOUTH EVERY DAY      Cardiovascular:  Angiotensin Receptor Blockers Failed - 04/14/2021 12:29 AM      Failed - Cr in normal range and within 180 days    Creatinine  Date Value Ref Range Status  06/19/2012 0.90 0.60 - 1.30 mg/dL Final   Creatinine, Ser  Date Value Ref Range Status  02/15/2021 1.03 (H) 0.57 - 1.00 mg/dL Final          Failed - Last BP in normal range    BP Readings from Last 1 Encounters:  02/15/21 (!) 143/85          Passed - K in normal range and within 180 days    Potassium  Date Value Ref Range Status  02/15/2021 4.1 3.5 - 5.2 mmol/L Final  06/19/2012 3.9 3.5 - 5.1 mmol/L Final          Passed - Patient is not pregnant      Passed - Valid encounter within last 6 months    Recent Outpatient Visits           1 month ago Annual physical exam   [facility] [name]., MD   8 months ago Primary hypertension   [facility] [name]., MD   10 months ago Acute conjunctivitis of left eye, unspecified acute conjunctivitis type   [facility] [name]., MD   11 months ago Essential hypertension   [facility] [name]., MD   1 year ago Essential hypertension   [facility] [name]., MD       Future Appointments             In 3 months [name], [name], MD [facility]   In 4 months [name]., MD [facility]               losartan (COZAAR) 100 MG tablet [Pharmacy Med Name: LOSARTAN POTASSIUM 100 MG  TAB] 45 tablet 1    Sig: TAKE 1/2 TABLET BY MOUTH EVERY DAY      Cardiovascular:  Angiotensin Receptor Blockers Failed - 04/14/2021 12:29 AM      Failed - Cr in normal range and within 180 days    Creatinine  Date Value Ref Range Status  06/19/2012 0.90 0.60 - 1.30 mg/dL Final   Creatinine, Ser  Date Value Ref Range Status  02/15/2021 1.03 (H) 0.57 - 1.00 mg/dL Final          Failed - Last BP in normal range    BP Readings from Last 1 Encounters:  02/15/21 (!) 143/85          Passed - K in normal range and within 180 days    Potassium  Date Value Ref Range Status  02/15/2021 4.1 3.5 - 5.2 mmol/L Final  06/19/2012 3.9 3.5 - 5.1 mmol/L Final          Passed - Patient is not pregnant      Passed - Valid encounter within last 6 months  Recent Outpatient Visits           1 month ago Annual physical exam   [facility] [name]., MD   8 months ago Primary hypertension   [facility] [name]., MD   10 months ago Acute conjunctivitis of left eye, unspecified acute conjunctivitis type   [facility] [name]., MD   11 months ago Essential hypertension   [facility] [name]., MD   1 year ago Essential hypertension   [facility] [name]., MD       Future Appointments             In 3 months [name], [name], MD [facility]   In 4 months [name]., MD [facility] [place]

## 2021-05-11 ENCOUNTER — Other Ambulatory Visit: Payer: Self-pay | Admitting: Family Medicine

## 2021-05-11 ENCOUNTER — Other Ambulatory Visit: Payer: Self-pay | Admitting: Obstetrics and Gynecology

## 2021-05-11 DIAGNOSIS — I10 Essential (primary) hypertension: Secondary | ICD-10-CM

## 2021-05-11 DIAGNOSIS — N951 Menopausal and female climacteric states: Secondary | ICD-10-CM

## 2021-05-11 NOTE — Telephone Encounter (Signed)
Future visit in 3 months  

## 2021-06-01 ENCOUNTER — Ambulatory Visit: Payer: 59 | Admitting: Family Medicine

## 2021-07-12 ENCOUNTER — Other Ambulatory Visit: Payer: Self-pay | Admitting: Family Medicine

## 2021-07-12 DIAGNOSIS — G4700 Insomnia, unspecified: Secondary | ICD-10-CM

## 2021-07-15 ENCOUNTER — Encounter: Payer: 59 | Admitting: Obstetrics and Gynecology

## 2021-07-16 ENCOUNTER — Encounter: Payer: 59 | Admitting: Obstetrics and Gynecology

## 2021-07-22 ENCOUNTER — Ambulatory Visit (INDEPENDENT_AMBULATORY_CARE_PROVIDER_SITE_OTHER): Payer: 59 | Admitting: Obstetrics and Gynecology

## 2021-07-22 ENCOUNTER — Encounter: Payer: Self-pay | Admitting: Obstetrics and Gynecology

## 2021-07-22 ENCOUNTER — Other Ambulatory Visit: Payer: Self-pay

## 2021-07-22 VITALS — BP 165/99 | HR 96 | Ht 64.0 in | Wt 125.8 lb

## 2021-07-22 DIAGNOSIS — Z01419 Encounter for gynecological examination (general) (routine) without abnormal findings: Secondary | ICD-10-CM

## 2021-07-22 NOTE — Progress Notes (Signed)
HPI:      [name] is a 55 y.o. [phone] who LMP was No LMP recorded. Patient has had a hysterectomy.  Subjective:   She presents today for her annual examination.  She has no complaints.  She has been following her cholesterol with her PCP.  He has recommended use of Metamucil. She is scheduled and due for mammogram in December.    Hx: The following portions of the patient's history were reviewed and updated as appropriate:             She  has a past medical history of Cervical dysphagia, Climacteric, Hypertension, Melanoma ([place]), Motion sickness, Pelvic adhesions, and Recurrent cystitis. She does not have any pertinent problems on file. She  has a past surgical history that includes Cholecystectomy (06/19/2012); Melanoma excision (06/2012); Excision basal cell carcinoma (2013); Breast cyst aspiration (01/2010); Ovary surgery (1997); Abdominal hysterectomy (2000); Liposuction; Exploratory laparotomy; Rotator cuff repair; Colonoscopy with propofol (N/A, 07/06/2015); and Parathyroidectomy (04/14/2017). Her family history includes Heart attack (age of onset: 79) in her maternal grandfather; Hypertension in her father and mother. She  reports that she has never smoked. She has never used smokeless tobacco. She reports that she does not drink alcohol and does not use drugs. She has a current medication list which includes the following prescription(s): estradiol, ibuprofen, losartan, zolpidem, gabapentin, hydrochlorothiazide, and losartan. She is allergic to sulfa antibiotics and penicillins.       Review of Systems:  Review of Systems  Constitutional: Denied constitutional symptoms, night sweats, recent illness, fatigue, fever, insomnia and weight loss.  Eyes: Denied eye symptoms, eye pain, photophobia, vision change and visual disturbance.  Ears/Nose/Throat/Neck: Denied ear, nose, throat or neck symptoms, hearing loss, nasal discharge, sinus congestion and sore throat.   Cardiovascular: Denied cardiovascular symptoms, arrhythmia, chest pain/pressure, edema, exercise intolerance, orthopnea and palpitations.  Respiratory: Denied pulmonary symptoms, asthma, pleuritic pain, productive sputum, cough, dyspnea and wheezing.  Gastrointestinal: Denied, gastro-esophageal reflux, melena, nausea and vomiting.  Genitourinary: Denied genitourinary symptoms including symptomatic vaginal discharge, pelvic relaxation issues, and urinary complaints.  Musculoskeletal: Denied musculoskeletal symptoms, stiffness, swelling, muscle weakness and myalgia.  Dermatologic: Denied dermatology symptoms, rash and scar.  Neurologic: Denied neurology symptoms, dizziness, headache, neck pain and syncope.  Psychiatric: Denied psychiatric symptoms, anxiety and depression.  Endocrine: Denied endocrine symptoms including hot flashes and night sweats.   Meds:   Current Outpatient Medications on File Prior to Visit  Medication Sig Dispense Refill   estradiol (ESTRACE) 1 MG tablet TAKE 1 TABLET BY MOUTH EVERY DAY 90 tablet 0   ibuprofen (ADVIL) 800 MG tablet Take 800 mg by mouth every 6 (six) hours as needed. for pain     losartan (COZAAR) 50 MG tablet TAKE 1 TABLET BY MOUTH EVERY DAY 90 tablet 3   zolpidem (AMBIEN) 10 MG tablet TAKE 1 TABLET BY MOUTH AT BEDTIME AS NEEDED FOR SLEEP 30 tablet 4   gabapentin (NEURONTIN) 300 MG capsule Take by mouth 2 (two) times daily.  (Patient not taking: No sig reported)     hydrochlorothiazide (HYDRODIURIL) 25 MG tablet TAKE 1 TABLET BY MOUTH EVERY DAY (Patient not taking: Reported on 07/22/2021) 90 tablet 3   losartan (COZAAR) 100 MG tablet TAKE 1/2 TABLET BY MOUTH EVERY DAY (Patient not taking: Reported on 07/22/2021) 45 tablet 1   No current facility-administered medications on file prior to visit.       Objective:     Vitals:   07/22/21 0813  BP: (!) 165/99  Pulse: 96    Filed Weights   07/22/21 0813  Weight: 125 lb 12.8 oz (57.1 kg)               Physical examination General NAD, Conversant  HEENT Atraumatic; Op clear with mmm.  Normo-cephalic. Pupils reactive. Anicteric sclerae  Thyroid/Neck Smooth without nodularity or enlargement. Normal ROM.  Neck Supple.  Skin No rashes, lesions or ulceration. Normal palpated skin turgor. No nodularity.  Breasts: No masses or discharge.  Symmetric.  No axillary adenopathy.  Bilateral implants  Lungs: Clear to auscultation.No rales or wheezes. Normal Respiratory effort, no retractions.  Heart: NSR.  No murmurs or rubs appreciated. No periferal edema  Abdomen: Soft.  Non-tender.  No masses.  No HSM. No hernia abdominoplasty noted  Extremities: Moves all appropriately.  Normal ROM for age. No lymphadenopathy.  Neuro: Oriented to PPT.  Normal mood. Normal affect.     Pelvic:   Vulva: Normal appearance.  No lesions.   Vagina: No lesions or abnormalities noted.  Support: Normal pelvic support.  Urethra No masses tenderness or scarring.  Meatus Normal size without lesions or prolapse.  Cervix: Surgically absent   Anus: Normal exam.  No lesions.  Perineum: Normal exam.  No lesions.        Bimanual   Uterus: Surgically absent   Adnexae: No masses.  Non-tender to palpation.  Cul-de-sac: Negative for abnormality.     Assessment:    [id number] Patient Active Problem List   Diagnosis Date Noted   Incomplete tear of rotator cuff 09/11/2019   Shoulder pain 09/11/2019   Stiffness of shoulder joint 09/11/2019   Strain of rotator cuff capsule 09/11/2019   Disorder of bursae of shoulder region 09/11/2019   Status post lumbar discectomy 05/07/2019   Hand weakness 06/26/2018   Numbness and tingling 05/30/2018   Bilateral hand pain 04/03/2018   Diplopia 04/03/2018   Numbness and tingling in both hands 04/03/2018   Special screening for malignant neoplasms, colon    Perimenopausal vasomotor symptoms 06/10/2015   Status post hysterectomy 06/10/2015   History of cervical dysplasia 06/10/2015    History of oophorectomy, unilateral 06/10/2015   Cholelithiasis 03/31/2015   Melanoma ([place]) 03/31/2015   Hx of abnormal cervical Pap smear 03/31/2015   Tinea versicolor 03/31/2015   Vitamin D deficiency 03/31/2015   Hyperlipemia 03/31/2015   Depression 03/31/2015   Acute anxiety 03/31/2015   Insomnia 03/31/2015   Hypertension 03/31/2015   Plantar fasciitis 03/31/2015   Frequent UTI 03/31/2015   Discoid eczema 03/31/2015     1. Well woman exam with routine gynecological exam     Elevated cholesterol followed by PCP.   Plan:            1.  Basic Screening Recommendations The basic screening recommendations for asymptomatic women were discussed with the patient during her visit.  The age-appropriate recommendations were discussed with her and the rational for the tests reviewed.  When I am informed by the patient that another primary care physician has previously obtained the age-appropriate tests and they are up-to-date, only outstanding tests are ordered and referrals given as necessary.  Abnormal results of tests will be discussed with her when all of her results are completed.  Routine preventative health maintenance measures emphasized: Exercise/Diet/Weight control, Tobacco Warnings, Alcohol/Substance use risks and Stress Management Mammogram ordered 2.  Recommended recheck cholesterol and likely use of statin if not significantly improved.  Have discussed this with her.  Orders No orders of the defined types  were placed in this encounter.   No orders of the defined types were placed in this encounter.           F/U  No follow-ups on file.  Finis Bud, M.D. 07/22/2021 8:28 AM

## 2021-08-11 ENCOUNTER — Other Ambulatory Visit: Payer: Self-pay | Admitting: Obstetrics and Gynecology

## 2021-08-11 DIAGNOSIS — N951 Menopausal and female climacteric states: Secondary | ICD-10-CM

## 2021-08-17 NOTE — Progress Notes (Deleted)
Established patient visit   Patient: Jill Porter   DOB: 08/29/1966   55 y.o. Female  MRN: [id number] Visit Date: 08/18/2021  Today's healthcare provider: Wilhemena Durie, MD   No chief complaint on file.  Subjective    HPI  Hypertension, follow-up  BP Readings from Last 3 Encounters:  07/22/21 (!) 165/99  02/15/21 (!) 143/85  08/06/20 128/82   Wt Readings from Last 3 Encounters:  07/22/21 125 lb 12.8 oz (57.1 kg)  02/15/21 124 lb (56.2 kg)  08/06/20 126 lb (57.2 kg)     She was last seen for hypertension 6 months ago.  BP at that visit was 143/85. Management since that visit includes no changes. She reports {excellent/good/fair/poor:19665} compliance with treatment. She {is/is not:9024} having side effects. {document side effects if present:1} She {is/is not:9024} exercising. She {is/is not:9024} adherent to low salt diet.   Outside blood pressures are {enter patient reported home BP, or 'not being checked':1}.  She does not smoke.  Use of agents associated with hypertension: none.   --------------------------------------------------------------------------------------------------- Lipid/Cholesterol, follow-up  Last Lipid Panel: Lab Results  Component Value Date   CHOL 253 (H) 02/15/2021   LDLCALC 157 (H) 02/15/2021   HDL 60 02/15/2021   TRIG 199 (H) 02/15/2021    She was last seen for this 6 months ago.  Management since that visit includes no changes.  She reports {excellent/good/fair/poor:19665} compliance with treatment. She {is/is not:9024} having side effects. {document side effects if present:1}  Symptoms: {Yes/No:20286} appetite changes {Yes/No:20286} foot ulcerations  {Yes/No:20286} chest pain {Yes/No:20286} chest pressure/discomfort  {Yes/No:20286} dyspnea {Yes/No:20286} orthopnea  {Yes/No:20286} fatigue {Yes/No:20286} lower extremity edema  {Yes/No:20286} palpitations {Yes/No:20286} paroxysmal nocturnal dyspnea  {Yes/No:20286}  nausea {Yes/No:20286} numbness or tingling of extremity  {Yes/No:20286} polydipsia {Yes/No:20286} polyuria  {Yes/No:20286} speech difficulty {Yes/No:20286} syncope   She is following a {diet:21022986} diet. Current exercise: {exercise RXYVO:59292}  Last metabolic panel Lab Results  Component Value Date   GLUCOSE 96 02/15/2021   NA 138 02/15/2021   K 4.1 02/15/2021   BUN 10 02/15/2021   CREATININE 1.03 (H) 02/15/2021   EGFR 64 02/15/2021   GFRNONAA 65 09/11/2019   CALCIUM 9.4 02/15/2021   AST 20 02/15/2021   ALT 19 02/15/2021   The 10-year ASCVD risk score (Arnett DK, et al., 2019) is: 5.7%  --------------------------------------------------------------------------------------------------- Follow up for insomnia  The patient was last seen for this 6 months ago. Changes made at last visit include no changes.  She reports {excellent/good/fair/poor:19665} compliance with treatment. She feels that condition is {improved/worse/unchanged:3041574}. She {is/is not:21021397} having side effects. ***  -----------------------------------------------------------------------------------------   {Link to patient history deactivated due to formatting error:1}  Medications: Outpatient Medications Prior to Visit  Medication Sig   estradiol (ESTRACE) 1 MG tablet TAKE 1 TABLET BY MOUTH EVERY DAY   gabapentin (NEURONTIN) 300 MG capsule Take by mouth 2 (two) times daily.  (Patient not taking: No sig reported)   hydrochlorothiazide (HYDRODIURIL) 25 MG tablet TAKE 1 TABLET BY MOUTH EVERY DAY (Patient not taking: Reported on 07/22/2021)   ibuprofen (ADVIL) 800 MG tablet Take 800 mg by mouth every 6 (six) hours as needed. for pain   losartan (COZAAR) 100 MG tablet TAKE 1/2 TABLET BY MOUTH EVERY DAY (Patient not taking: Reported on 07/22/2021)   losartan (COZAAR) 50 MG tablet TAKE 1 TABLET BY MOUTH EVERY DAY   zolpidem (AMBIEN) 10 MG tablet TAKE 1 TABLET BY MOUTH AT BEDTIME AS NEEDED FOR SLEEP    No facility-administered  medications prior to visit.    Review of Systems      Objective    There were no vitals taken for this visit. BP Readings from Last 3 Encounters:  07/22/21 (!) 165/99  02/15/21 (!) 143/85  08/06/20 128/82   Wt Readings from Last 3 Encounters:  07/22/21 125 lb 12.8 oz (57.1 kg)  02/15/21 124 lb (56.2 kg)  08/06/20 126 lb (57.2 kg)      Physical Exam  ***  No results found for any visits on 08/18/21.  Assessment & Plan     ***  No follow-ups on file.      {provider attestation***:1}   Wilhemena Durie, MD  [facility] [phone] (phone) [phone] (fax)  [place]

## 2021-08-18 ENCOUNTER — Ambulatory Visit: Payer: 59 | Admitting: Family Medicine

## 2021-08-18 DIAGNOSIS — F5102 Adjustment insomnia: Secondary | ICD-10-CM

## 2021-08-18 DIAGNOSIS — I10 Essential (primary) hypertension: Secondary | ICD-10-CM

## 2021-08-18 DIAGNOSIS — E785 Hyperlipidemia, unspecified: Secondary | ICD-10-CM

## 2021-11-05 ENCOUNTER — Other Ambulatory Visit: Payer: Self-pay | Admitting: Family Medicine

## 2021-11-05 DIAGNOSIS — I10 Essential (primary) hypertension: Secondary | ICD-10-CM

## 2021-11-05 NOTE — Telephone Encounter (Signed)
Requested medications are due for refill today.  unsure  Requested medications are on the active medications list.  yes  Last refill. 05/11/2021  Future visit scheduled.   yes  Notes to clinic.  Pt has rx's for both 50 and 100 mg tablets. (Pt is to take 1/2 of the 100mg  tab). Per request pt is not taking the 100 mg as of 07/22/2021.  Also overdue on labs.    Requested Prescriptions  Pending Prescriptions Disp Refills   losartan (COZAAR) 100 MG tablet [Pharmacy Med Name: LOSARTAN POTASSIUM 100 MG TAB] 45 tablet 1    Sig: TAKE 1/2 TABLET BY MOUTH EVERY DAY     Cardiovascular:  Angiotensin Receptor Blockers Failed - 11/05/2021  2:02 AM      Failed - Cr in normal range and within 180 days    Creatinine  Date Value Ref Range Status  06/19/2012 0.90 0.60 - 1.30 mg/dL Final   Creatinine, Ser  Date Value Ref Range Status  02/15/2021 1.03 (H) 0.57 - 1.00 mg/dL Final          Failed - K in normal range and within 180 days    Potassium  Date Value Ref Range Status  02/15/2021 4.1 3.5 - 5.2 mmol/L Final  06/19/2012 3.9 3.5 - 5.1 mmol/L Final          Failed - Last BP in normal range    BP Readings from Last 1 Encounters:  07/22/21 (!) 165/99          Failed - Valid encounter within last 6 months    Recent Outpatient Visits           8 months ago Annual physical exam   [facility] [name]., MD   1 year ago Primary hypertension   [facility] [name]., MD   1 year ago Acute conjunctivitis of left eye, unspecified acute conjunctivitis type   [facility] [name]., MD   1 year ago Essential hypertension   [facility] [name]., MD   1 year ago Essential hypertension   [facility] [name]., MD       Future Appointments             In 3 days [name]., MD [facility] [place]   In 8 months [name], [name],  MD [facility] - Patient is not pregnant

## 2021-11-08 ENCOUNTER — Other Ambulatory Visit: Payer: Self-pay | Admitting: Obstetrics and Gynecology

## 2021-11-08 ENCOUNTER — Other Ambulatory Visit: Payer: Self-pay | Admitting: Family Medicine

## 2021-11-08 ENCOUNTER — Encounter: Payer: Self-pay | Admitting: Family Medicine

## 2021-11-08 ENCOUNTER — Ambulatory Visit (INDEPENDENT_AMBULATORY_CARE_PROVIDER_SITE_OTHER): Payer: Commercial Managed Care - PPO | Admitting: Family Medicine

## 2021-11-08 ENCOUNTER — Other Ambulatory Visit: Payer: Self-pay

## 2021-11-08 VITALS — BP 170/92 | HR 96 | Resp 16 | Ht 64.0 in | Wt 125.0 lb

## 2021-11-08 DIAGNOSIS — F5102 Adjustment insomnia: Secondary | ICD-10-CM

## 2021-11-08 DIAGNOSIS — Z9889 Other specified postprocedural states: Secondary | ICD-10-CM

## 2021-11-08 DIAGNOSIS — I10 Essential (primary) hypertension: Secondary | ICD-10-CM

## 2021-11-08 DIAGNOSIS — D351 Benign neoplasm of parathyroid gland: Secondary | ICD-10-CM

## 2021-11-08 DIAGNOSIS — E559 Vitamin D deficiency, unspecified: Secondary | ICD-10-CM

## 2021-11-08 DIAGNOSIS — M19041 Primary osteoarthritis, right hand: Secondary | ICD-10-CM | POA: Diagnosis not present

## 2021-11-08 DIAGNOSIS — M79641 Pain in right hand: Secondary | ICD-10-CM

## 2021-11-08 DIAGNOSIS — M79642 Pain in left hand: Secondary | ICD-10-CM

## 2021-11-08 DIAGNOSIS — M19042 Primary osteoarthritis, left hand: Secondary | ICD-10-CM

## 2021-11-08 DIAGNOSIS — E785 Hyperlipidemia, unspecified: Secondary | ICD-10-CM | POA: Diagnosis not present

## 2021-11-08 DIAGNOSIS — N951 Menopausal and female climacteric states: Secondary | ICD-10-CM

## 2021-11-08 DIAGNOSIS — R5383 Other fatigue: Secondary | ICD-10-CM

## 2021-11-08 MED ORDER — LOSARTAN POTASSIUM 100 MG PO TABS: 100.0000 mg | ORAL_TABLET | Freq: Every day | ORAL | 1 refills | Status: DC

## 2021-11-08 NOTE — Patient Instructions (Signed)
CHECK BLOOD PRESSURES AT HOME. TRY OVER-THE-COUNTER DICLOFENAC GEL AND TYLENOL FOR HAND PAIN.

## 2021-11-08 NOTE — Progress Notes (Signed)
Established patient visit  I,April Miller,acting as a scribe for Wilhemena Durie, MD.,have documented all relevant documentation on the behalf of Wilhemena Durie, MD,as directed by  Wilhemena Durie, MD while in the presence of Wilhemena Durie, MD.   Patient: Jill Porter   DOB: 1966/02/12   56 y.o. Female  MRN: [id number] Visit Date: 11/08/2021  Today's healthcare provider: Wilhemena Durie, MD   Chief Complaint  Patient presents with   Follow-up   Hypertension   Subjective    HPI  Patient comes in today for follow-up.  She has been feeling well.  She does have some fatigue recently and hand pain right greater than left especially around the thumb.  She has no discernible swelling in the hand or thumb. Hypertension, follow-up  BP Readings from Last 3 Encounters:  11/08/21 (!) 170/92  07/22/21 (!) 165/99  02/15/21 (!) 143/85   Wt Readings from Last 3 Encounters:  11/08/21 125 lb (56.7 kg)  07/22/21 125 lb 12.8 oz (57.1 kg)  02/15/21 124 lb (56.2 kg)     She was last seen for hypertension 9 months ago.  BP at that visit was 143/85. Management since that visit includes; losartan 100 mg and 50 mg on list also taking HCTZ. She reports good compliance with treatment. She is not having side effects. none She is exercising. She is adherent to low salt diet.   Outside blood pressures are not checking.  She does not smoke.  Use of agents associated with hypertension: none.   ---------------------------------------------------------------------------------------------------   Medications: Outpatient Medications Prior to Visit  Medication Sig   estradiol (ESTRACE) 1 MG tablet TAKE 1 TABLET BY MOUTH EVERY DAY   gabapentin (NEURONTIN) 300 MG capsule Take by mouth 2 (two) times daily.   hydrochlorothiazide (HYDRODIURIL) 25 MG tablet TAKE 1 TABLET BY MOUTH EVERY DAY   ibuprofen (ADVIL) 800 MG tablet Take 800 mg by mouth every 6 (six) hours as needed. for  pain   losartan (COZAAR) 50 MG tablet TAKE 1 TABLET BY MOUTH EVERY DAY   zolpidem (AMBIEN) 10 MG tablet TAKE 1 TABLET BY MOUTH AT BEDTIME AS NEEDED FOR SLEEP   [DISCONTINUED] losartan (COZAAR) 100 MG tablet TAKE 1/2 TABLET BY MOUTH EVERY DAY (Patient not taking: Reported on 11/08/2021)   No facility-administered medications prior to visit.    Review of Systems  Constitutional:  Negative for appetite change, chills, fatigue and fever.  Respiratory:  Negative for chest tightness and shortness of breath.   Cardiovascular:  Negative for chest pain and palpitations.  Gastrointestinal:  Negative for abdominal pain, nausea and vomiting.  Neurological:  Negative for dizziness and weakness.       Objective    BP (!) 170/92 (BP Location: Right Arm, Patient Position: Sitting, Cuff Size: Normal)    Pulse 96    Resp 16    Ht 5\' 4"  (1.626 m)    Wt 125 lb (56.7 kg)    SpO2 99%    BMI 21.46 kg/m  BP Readings from Last 3 Encounters:  11/08/21 (!) 170/92  07/22/21 (!) 165/99  02/15/21 (!) 143/85   Wt Readings from Last 3 Encounters:  11/08/21 125 lb (56.7 kg)  07/22/21 125 lb 12.8 oz (57.1 kg)  02/15/21 124 lb (56.2 kg)      Physical Exam Vitals reviewed.  Constitutional:      Appearance: Normal appearance.  HENT:     Right Ear: External ear normal.  Left Ear: External ear normal.  Eyes:     General: No scleral icterus.    Conjunctiva/sclera: Conjunctivae normal.  Cardiovascular:     Rate and Rhythm: Normal rate.     Pulses: Normal pulses.     Heart sounds: Normal heart sounds.  Pulmonary:     Effort: Pulmonary effort is normal.  Abdominal:     Palpations: Abdomen is soft.  Musculoskeletal:        General: No swelling, tenderness or deformity.     Right lower leg: No edema.     Left lower leg: No edema.     Comments: Examination of the hands is normal  Skin:    General: Skin is warm and dry.  Neurological:     General: No focal deficit present.     Mental Status: She is  alert and oriented to person, place, and time.  Psychiatric:        Mood and Affect: Mood normal.        Behavior: Behavior normal.        Thought Content: Thought content normal.        Judgment: Judgment normal.      No results found for any visits on 11/08/21.  Assessment & Plan     1. Essential hypertension Crease losartan from 50 to 100 mg daily.  Check blood pressures at home weekly. - losartan (COZAAR) 100 MG tablet; Take 1 tablet (100 mg total) by mouth daily.  Dispense: 90 tablet; Refill: 1 - Lipid panel - CBC w/Diff/Platelet - Comprehensive Metabolic Panel (CMET) - TSH  2. Hyperlipidemia, unspecified hyperlipidemia type  - Lipid panel - CBC w/Diff/Platelet - Comprehensive Metabolic Panel (CMET) - TSH  3. Bilateral hand pain I think she has some arthritis particularly of the thumb.  Osteoarthritis most likely.  No signs of any autoimmune/rheumatoid arthritis  4. Osteoarthritis of both hands, unspecified osteoarthritis type  - Lipid panel - CBC w/Diff/Platelet - Comprehensive Metabolic Panel (CMET) - TSH  5. Fatigue, unspecified type Patient describes that she is able to live her life but when she gets tired she gets tired and has to rest - Lipid panel - CBC w/Diff/Platelet - Comprehensive Metabolic Panel (CMET) - TSH  6. Parathyroid adenoma  - Lipid panel - CBC w/Diff/Platelet - Comprehensive Metabolic Panel (CMET) - TSH  7. Status post lumbar discectomy Clinically improved  8. Vitamin D deficiency   9. Adjustment insomnia Try to cut back on her sleeping pills over time   No follow-ups on file.      I, Wilhemena Durie, MD, have reviewed all documentation for this visit. The documentation on 11/09/21 for the exam, diagnosis, procedures, and orders are all accurate and complete.    Rudra Hobbins Cranford Mon, MD  [facility] [phone] (phone) [phone] (fax)  [place]

## 2021-11-13 LAB — CBC WITH DIFFERENTIAL/PLATELET
Basophils Absolute: 0.1 10*3/uL (ref 0.0–0.2)
Basos: 1 %
EOS (ABSOLUTE): 0.1 10*3/uL (ref 0.0–0.4)
Eos: 2 %
Hematocrit: 37.3 % (ref 34.0–46.6)
Hemoglobin: 12.7 g/dL (ref 11.1–15.9)
Immature Grans (Abs): 0 10*3/uL (ref 0.0–0.1)
Immature Granulocytes: 0 %
Lymphocytes Absolute: 2.6 10*3/uL (ref 0.7–3.1)
Lymphs: 48 %
MCH: 29.1 pg (ref 26.6–33.0)
MCHC: 34 g/dL (ref 31.5–35.7)
MCV: 85 fL (ref 79–97)
Monocytes Absolute: 0.4 10*3/uL (ref 0.1–0.9)
Monocytes: 8 %
Neutrophils Absolute: 2.2 10*3/uL (ref 1.4–7.0)
Neutrophils: 41 %
Platelets: 243 10*3/uL (ref 150–450)
RBC: 4.37 x10E6/uL (ref 3.77–5.28)
RDW: 12.6 % (ref 11.7–15.4)
WBC: 5.3 10*3/uL (ref 3.4–10.8)

## 2021-11-13 LAB — COMPREHENSIVE METABOLIC PANEL
ALT: 13 IU/L (ref 0–32)
AST: 14 IU/L (ref 0–40)
Albumin/Globulin Ratio: 1.5 (ref 1.2–2.2)
Albumin: 4.1 g/dL (ref 3.8–4.9)
Alkaline Phosphatase: 53 IU/L (ref 44–121)
BUN/Creatinine Ratio: 17 (ref 9–23)
BUN: 16 mg/dL (ref 6–24)
Bilirubin Total: 0.2 mg/dL (ref 0.0–1.2)
CO2: 24 mmol/L (ref 20–29)
Calcium: 9 mg/dL (ref 8.7–10.2)
Chloride: 101 mmol/L (ref 96–106)
Creatinine, Ser: 0.96 mg/dL (ref 0.57–1.00)
Globulin, Total: 2.7 g/dL (ref 1.5–4.5)
Glucose: 83 mg/dL (ref 70–99)
Potassium: 3.8 mmol/L (ref 3.5–5.2)
Sodium: 138 mmol/L (ref 134–144)
Total Protein: 6.8 g/dL (ref 6.0–8.5)
eGFR: 69 mL/min/{1.73_m2} (ref >59)

## 2021-11-13 LAB — TSH: TSH: 0.83 u[IU]/mL (ref 0.450–4.500)

## 2021-11-13 LAB — LIPID PANEL
Chol/HDL Ratio: 3.2 ratio (ref 0.0–4.4)
Cholesterol, Total: 211 mg/dL — ABNORMAL HIGH (ref 100–199)
HDL: 66 mg/dL (ref >39)
LDL Chol Calc (NIH): 121 mg/dL — ABNORMAL HIGH (ref 0–99)
Triglycerides: 137 mg/dL (ref 0–149)
VLDL Cholesterol Cal: 24 mg/dL (ref 5–40)

## 2021-12-07 ENCOUNTER — Other Ambulatory Visit: Payer: Self-pay | Admitting: Family Medicine

## 2021-12-07 DIAGNOSIS — G4700 Insomnia, unspecified: Secondary | ICD-10-CM

## 2021-12-08 NOTE — Telephone Encounter (Signed)
Requested medication (s) are due for refill today: yes ? ?Requested medication (s) are on the active medication list: yes ? ?Last refill:  07/13/21 #30/4 ? ?Future visit scheduled: yes ? ?Notes to clinic:  Unable to refill per protocol, cannot delegate.  ? ? ?  ?Requested Prescriptions  ?Pending Prescriptions Disp Refills  ? zolpidem (AMBIEN) 10 MG tablet [Pharmacy Med Name: ZOLPIDEM TARTRATE 10 MG TABLET] 30 tablet 3  ?  Sig: TAKE 1 TABLET BY MOUTH EVERY DAY AT BEDTIME AS NEEDED FOR SLEEP  ?  ? Not Delegated - Psychiatry:  Anxiolytics/Hypnotics Failed - 12/07/2021  5:07 PM  ?  ?  Failed - This refill cannot be delegated  ?  ?  Failed - Urine Drug Screen completed in last 360 days  ?  ?  Passed - Valid encounter within last 6 months  ?  Recent Outpatient Visits   ? ?      ? 1 month ago Essential hypertension  ? [facility] [name]., MD  ? 9 months ago Annual physical exam  ? [facility] [name]., MD  ? 1 year ago Primary hypertension  ? [facility] [name]., MD  ? 1 year ago Acute conjunctivitis of left eye, unspecified acute conjunctivitis type  ? [facility] [name]., MD  ? 1 year ago Essential hypertension  ? [facility] [name]., MD  ? ?  ?  ?Future Appointments   ? ?        ? In 3 months [name]., MD [facility]  ? In 7 months [name], [name], MD [facility]  ? ?  ? ?  ?  ?  ? ?

## 2021-12-09 ENCOUNTER — Other Ambulatory Visit: Payer: Self-pay

## 2021-12-09 DIAGNOSIS — G4700 Insomnia, unspecified: Secondary | ICD-10-CM

## 2021-12-09 NOTE — Telephone Encounter (Signed)
Patient is out of Ambien 10 mg.  and needs this sent into [facility] on [address] not [address]. [employer].   She would like to get this today as she has been out for 2 days now.

## 2021-12-09 NOTE — Addendum Note (Signed)
Addended by: Julieta Bellini on: 12/09/2021 10:03 AM ? ? Modules accepted: Orders ? ?

## 2021-12-10 NOTE — Telephone Encounter (Signed)
Pt stated she has still not been able to pick up this Rx, pt would like a call back, please advise.  ?

## 2021-12-13 MED ORDER — ZOLPIDEM TARTRATE 10 MG PO TABS: 10.0000 mg | ORAL_TABLET | Freq: Every evening | ORAL | 4 refills | Status: DC | PRN

## 2022-02-05 ENCOUNTER — Other Ambulatory Visit: Payer: Self-pay | Admitting: Family Medicine

## 2022-02-05 DIAGNOSIS — I10 Essential (primary) hypertension: Secondary | ICD-10-CM

## 2022-02-22 ENCOUNTER — Telehealth: Payer: Self-pay

## 2022-02-22 DIAGNOSIS — R350 Frequency of micturition: Secondary | ICD-10-CM

## 2022-02-22 NOTE — Addendum Note (Signed)
Addended by: Shawna Orleans on: 02/22/2022 04:24 PM   Modules accepted: Orders

## 2022-02-22 NOTE — Telephone Encounter (Signed)
Copied from [place] [id number]. Topic: Referral - Request for Referral >> Feb 22, 2022  2:05 PM Rayann Heman wrote: Has patient seen PCP for this complaint? Yes.   *If NO, is insurance requiring patient see PCP for this issue before PCP can refer them? Referral for which specialty: Urology  Preferred provider/office: [facility]  Reason for referral: Increased issues with bladder leakage

## 2022-02-25 ENCOUNTER — Encounter: Payer: Self-pay | Admitting: *Deleted

## 2022-03-02 ENCOUNTER — Ambulatory Visit (INDEPENDENT_AMBULATORY_CARE_PROVIDER_SITE_OTHER): Payer: Commercial Managed Care - PPO | Admitting: Urology

## 2022-03-02 ENCOUNTER — Encounter: Payer: Self-pay | Admitting: Urology

## 2022-03-02 VITALS — BP 157/92 | HR 108 | Ht 64.0 in | Wt 130.0 lb

## 2022-03-02 DIAGNOSIS — N3946 Mixed incontinence: Secondary | ICD-10-CM | POA: Diagnosis not present

## 2022-03-02 DIAGNOSIS — R350 Frequency of micturition: Secondary | ICD-10-CM

## 2022-03-02 LAB — BLADDER SCAN AMB NON-IMAGING: Scan Result: 0

## 2022-03-02 MED ORDER — OXYBUTYNIN CHLORIDE ER 10 MG PO TB24: 10.0000 mg | ORAL_TABLET | Freq: Every day | ORAL | 11 refills | Status: DC

## 2022-03-02 NOTE — Progress Notes (Signed)
03/02/2022 9:23 AM   Jill Porter [birth year] [id number]  Referring provider:  [name]., MD [address] [place],  [place] [address] Chief Complaint  Patient presents with   Urinary Frequency      HPI: Jill Porter is a 56 y.o.female who presents today for further evaluation of urinary frequency/leakage.   She reports that she has urinary leakage that has been ongoing for the last year. She leaks when he laughs, coughs, and sneezes but she also leaks when she stands. She reports that she tries not to use pads because she has a history of UTIs. She has tried estrogen cream and pelvic floor exercises. She also experiences urinary frequency. She denies pressure, bulging or pain during sexual intercourse. She also denies constipation.   She is on estradiol tablets.   She had an abdominal hysterectomy for precancerous cells. She has had vaginal deliveries.   UA today shows many epithelial cells but otherwise unremarkable per higher prior field.    PMH: Past Medical History:  Diagnosis Date   Cervical dysphagia    Climacteric    Hypertension    Melanoma ([place])    Motion sickness    anytime moving   Pelvic adhesions    Recurrent cystitis     Surgical History: Past Surgical History:  Procedure Laterality Date   ABDOMINAL HYSTERECTOMY  2000   due to pre cancerous cells   BASAL CELL CARCINOMA EXCISION  2013   removed from forehead and Lt [place]   BREAST CYST ASPIRATION  01/2010   CHOLECYSTECTOMY  06/19/2012   COLONOSCOPY WITH PROPOFOL N/A 07/06/2015   Procedure: COLONOSCOPY WITH PROPOFOL;  Surgeon: [name], MD;  Location: [facility];  Service: Endoscopy;  Laterality: N/A;   EXPLORATORY LAPAROTOMY     lysis of adhesions   LIPOSUCTION     MELANOMA EXCISION  06/2012   Removed from Rt shoulder   OVARY SURGERY  1997   hx of removal of ovary and adhesions   PARATHYROIDECTOMY  04/14/2017   ROTATOR CUFF REPAIR      Home  Medications:  Allergies as of 03/02/2022       Reactions   Sulfa Antibiotics Hives   Tape    Penicillins Itching, Rash        Medication List        Accurate as of Mar 02, 2022 11:59 PM. If you have any questions, ask your nurse or doctor.          STOP taking these medications    ibuprofen 800 MG tablet Commonly known as: ADVIL Stopped by: Hollice Espy, MD       TAKE these medications    estradiol 1 MG tablet Commonly known as: ESTRACE TAKE 1 TABLET BY MOUTH EVERY DAY   gabapentin 300 MG capsule Commonly known as: NEURONTIN Take by mouth 2 (two) times daily.   hydrochlorothiazide 25 MG tablet Commonly known as: HYDRODIURIL TAKE 1 TABLET BY MOUTH EVERY DAY   losartan 100 MG tablet Commonly known as: COZAAR Take 1 tablet (100 mg total) by mouth daily. What changed: Another medication with the same name was removed. Continue taking this medication, and follow the directions you see here. Changed by: Hollice Espy, MD   oxybutynin 10 MG 24 hr tablet Commonly known as: DITROPAN-XL Take 1 tablet (10 mg total) by mouth daily. Started by: Hollice Espy, MD   zolpidem 10 MG tablet Commonly known as: AMBIEN TAKE 1 TABLET BY MOUTH EVERY  DAY AT BEDTIME AS NEEDED FOR SLEEP What changed: Another medication with the same name was removed. Continue taking this medication, and follow the directions you see here. Changed by: Hollice Espy, MD        Allergies:  Allergies  Allergen Reactions   Sulfa Antibiotics Hives   Tape    Penicillins Itching and Rash    Family History: Family History  Problem Relation Age of Onset   Hypertension Mother    Hypertension Father    Heart attack Maternal Grandfather 55       MI   Cancer Neg Hx    Diabetes Neg Hx    Heart disease Neg Hx    Kidney cancer Neg Hx    Bladder Cancer Neg Hx     Social History:  reports that she has never smoked. She has never used smokeless tobacco. She reports that she does not drink  alcohol and does not use drugs.   Physical Exam: BP (!) 157/92   Pulse (!) 108   Ht '5\' 4"'$  (1.626 m)   Wt 130 lb (59 kg)   BMI 22.31 kg/m   Constitutional:  Alert and oriented, No acute distress. HEENT: [place] AT, moist mucus membranes.  Trachea midline, no masses. Cardiovascular: No clubbing, cyanosis, or edema. Respiratory: Normal respiratory effort, no increased work of breathing. Skin: No rashes, bruises or suspicious lesions. Neurologic: Grossly intact, no focal deficits, moving all 4 extremities. Psychiatric: Normal mood and affect.  Laboratory Data:  Lab Results  Component Value Date   CREATININE 0.96 11/12/2021   Lab Results  Component Value Date   HGBA1C 5.2 07/15/2020    Urinalysis Many epithelial cells but otherwise unremarkable   Pertinent Imaging: Results for orders placed or performed in visit on 03/02/22  Microscopic Examination   Urine  Result Value Ref Range   WBC, UA 0-5 0 - 5 /hpf   RBC 0-2 0 - 2 /hpf   Epithelial Cells (non renal) >10 (A) 0 - 10 /hpf   Bacteria, UA Moderate (A) None seen/Few  Urinalysis, Complete  Result Value Ref Range   Specific Gravity, UA 1.020 1.005 - 1.030   pH, UA 6.0 5.0 - 7.5   Color, UA Yellow Yellow   Appearance Ur Clear Clear   Leukocytes,UA Negative Negative   Protein,UA Negative Negative/Trace   Glucose, UA Negative Negative   Ketones, UA Negative Negative   RBC, UA Negative Negative   Bilirubin, UA Negative Negative   Urobilinogen, Ur 0.2 0.2 - 1.0 mg/dL   Nitrite, UA Negative Negative   Microscopic Examination See below:   BLADDER SCAN AMB NON-IMAGING  Result Value Ref Range   Scan Result 0 ml     Assessment & Plan:    Mixed urinary incontinence  - Discussed both stress and urge incontinence. We discussed that lack of structural support causes stress incontinence which can be treated with surgical intervention or physical therapy. We also discussed overactivity problems which cause urge incontinence.   -Based on her history, she does have a mild urge component to her incontinence although the majority of her concerns are related to her stress incontinence - We discussed addition of Oxybutynin to regimen. We discussed side effects including dry eyes and dry mouth - Recommend she follow-up with Dr Matilde Sprang for surgical options for stress incontinence.  She is interested in an appointment with him  - Oxybutynin 10 mg XL; prescribed     Conley Rolls as a scribe for Hollice Espy, MD.,have documented  all relevant documentation on the behalf of Hollice Espy, MD,as directed by  Hollice Espy, MD while in the presence of Hollice Espy, MD.  I have reviewed the above documentation for accuracy and completeness, and I agree with the above.   Hollice Espy, MD  [facility] [address], [place], [place] [address] [phone]

## 2022-03-03 LAB — URINALYSIS, COMPLETE
Bilirubin, UA: NEGATIVE
Glucose, UA: NEGATIVE
Ketones, UA: NEGATIVE
Leukocytes,UA: NEGATIVE
Nitrite, UA: NEGATIVE
Protein,UA: NEGATIVE
RBC, UA: NEGATIVE
Specific Gravity, UA: 1.02 (ref 1.005–1.030)
Urobilinogen, Ur: 0.2 mg/dL (ref 0.2–1.0)
pH, UA: 6 (ref 5.0–7.5)

## 2022-03-03 LAB — MICROSCOPIC EXAMINATION: Epithelial Cells (non renal): >10 /hpf — AB (ref 0–10)

## 2022-03-08 ENCOUNTER — Other Ambulatory Visit: Payer: Self-pay | Admitting: Obstetrics and Gynecology

## 2022-03-08 ENCOUNTER — Other Ambulatory Visit: Payer: Self-pay | Admitting: Family Medicine

## 2022-03-08 DIAGNOSIS — N951 Menopausal and female climacteric states: Secondary | ICD-10-CM

## 2022-03-08 DIAGNOSIS — I10 Essential (primary) hypertension: Secondary | ICD-10-CM

## 2022-03-09 ENCOUNTER — Encounter: Payer: Commercial Managed Care - PPO | Admitting: Family Medicine

## 2022-03-24 ENCOUNTER — Other Ambulatory Visit: Payer: Self-pay | Admitting: Obstetrics and Gynecology

## 2022-03-24 DIAGNOSIS — N951 Menopausal and female climacteric states: Secondary | ICD-10-CM

## 2022-03-28 ENCOUNTER — Ambulatory Visit (INDEPENDENT_AMBULATORY_CARE_PROVIDER_SITE_OTHER): Payer: Commercial Managed Care - PPO | Admitting: Urology

## 2022-03-28 ENCOUNTER — Encounter: Payer: Self-pay | Admitting: Urology

## 2022-03-28 VITALS — BP 161/91 | HR 89 | Ht 64.0 in | Wt 130.0 lb

## 2022-03-28 DIAGNOSIS — N3946 Mixed incontinence: Secondary | ICD-10-CM

## 2022-03-28 LAB — MICROSCOPIC EXAMINATION

## 2022-03-28 LAB — URINALYSIS, COMPLETE
Bilirubin, UA: NEGATIVE
Glucose, UA: NEGATIVE
Ketones, UA: NEGATIVE
Leukocytes,UA: NEGATIVE
Nitrite, UA: NEGATIVE
RBC, UA: NEGATIVE
Specific Gravity, UA: 1.02 (ref 1.005–1.030)
Urobilinogen, Ur: 0.2 mg/dL (ref 0.2–1.0)
pH, UA: 6.5 (ref 5.0–7.5)

## 2022-03-31 LAB — CULTURE, URINE COMPREHENSIVE

## 2022-05-30 ENCOUNTER — Other Ambulatory Visit: Payer: Commercial Managed Care - PPO | Admitting: Urology

## 2022-05-30 ENCOUNTER — Encounter: Payer: Self-pay | Admitting: Urology

## 2022-06-22 ENCOUNTER — Other Ambulatory Visit: Payer: Self-pay | Admitting: Family Medicine

## 2022-06-22 DIAGNOSIS — G4700 Insomnia, unspecified: Secondary | ICD-10-CM

## 2022-07-26 ENCOUNTER — Encounter: Payer: Self-pay | Admitting: Obstetrics and Gynecology

## 2022-07-26 ENCOUNTER — Ambulatory Visit (INDEPENDENT_AMBULATORY_CARE_PROVIDER_SITE_OTHER): Payer: Commercial Managed Care - PPO | Admitting: Obstetrics and Gynecology

## 2022-07-26 VITALS — BP 158/101 | HR 96 | Ht 64.0 in | Wt 130.8 lb

## 2022-07-26 DIAGNOSIS — N951 Menopausal and female climacteric states: Secondary | ICD-10-CM | POA: Diagnosis not present

## 2022-07-26 DIAGNOSIS — Z01419 Encounter for gynecological examination (general) (routine) without abnormal findings: Secondary | ICD-10-CM

## 2022-07-26 DIAGNOSIS — Z1231 Encounter for screening mammogram for malignant neoplasm of breast: Secondary | ICD-10-CM

## 2022-07-26 MED ORDER — ESTRADIOL 1 MG PO TABS: 1.0000 mg | ORAL_TABLET | Freq: Every day | ORAL | 3 refills | Status: DC

## 2022-07-26 NOTE — Progress Notes (Signed)
HPI:      Jill Porter is a 56 y.o. [phone] who LMP was No LMP recorded. Patient has had a hysterectomy.  Subjective:   She presents today for her annual examination.  She continues on ERT and says she is doing well on it.  She does complain of some increased urine loss with exertion.  She saw a urologist who wanted to do multichannel urodynamics and cystoscopy and she was not interested at that time.  She mainly loses urine with coughing laughing sneezing, playing tennis etc.    Hx: The following portions of the patient's history were reviewed and updated as appropriate:             She  has a past medical history of Cervical dysphagia, Climacteric, Hypertension, Melanoma ([place]), Motion sickness, Pelvic adhesions, and Recurrent cystitis. She does not have any pertinent problems on file. She  has a past surgical history that includes Cholecystectomy (06/19/2012); Melanoma excision (06/2012); Excision basal cell carcinoma (2013); Breast cyst aspiration (01/2010); Ovary surgery (1997); Abdominal hysterectomy (2000); Liposuction; Exploratory laparotomy; Rotator cuff repair; Colonoscopy with propofol (N/A, 07/06/2015); and Parathyroidectomy (04/14/2017). Her family history includes Heart attack (age of onset: 61) in her maternal grandfather; Hypertension in her father and mother; Lymphoma in her mother. She  reports that she has never smoked. She has never been exposed to tobacco smoke. She has never used smokeless tobacco. She reports that she does not drink alcohol and does not use drugs. She has a current medication list which includes the following prescription(s): hydrochlorothiazide, losartan, zolpidem, and estradiol. She is allergic to sulfa antibiotics, tape, and penicillins.       Review of Systems:  Review of Systems  Constitutional: Denied constitutional symptoms, night sweats, recent illness, fatigue, fever, insomnia and weight loss.  Eyes: Denied eye symptoms, eye pain,  photophobia, vision change and visual disturbance.  Ears/Nose/Throat/Neck: Denied ear, nose, throat or neck symptoms, hearing loss, nasal discharge, sinus congestion and sore throat.  Cardiovascular: Denied cardiovascular symptoms, arrhythmia, chest pain/pressure, edema, exercise intolerance, orthopnea and palpitations.  Respiratory: Denied pulmonary symptoms, asthma, pleuritic pain, productive sputum, cough, dyspnea and wheezing.  Gastrointestinal: Denied, gastro-esophageal reflux, melena, nausea and vomiting.  Genitourinary: Urinary Denied genitourinary symptoms including symptomatic vaginal discharge, pelvic relaxation issues, and urinary complaints.  Musculoskeletal: Denied musculoskeletal symptoms, stiffness, swelling, muscle weakness and myalgia.  Dermatologic: Denied dermatology symptoms, rash and scar.  Neurologic: Denied neurology symptoms, dizziness, headache, neck pain and syncope.  Psychiatric: Denied psychiatric symptoms, anxiety and depression.  Endocrine: Denied endocrine symptoms including hot flashes and night sweats.   Meds:   Current Outpatient Medications on File Prior to Visit  Medication Sig Dispense Refill   hydrochlorothiazide (HYDRODIURIL) 25 MG tablet TAKE 1 TABLET BY MOUTH EVERY DAY 90 tablet 3   losartan (COZAAR) 100 MG tablet TAKE 1/2 TABLET BY MOUTH EVERY DAY 45 tablet 1   zolpidem (AMBIEN) 10 MG tablet TAKE 1 TABLET (10 MG TOTAL) BY MOUTH AT BEDTIME AS NEEDED. FOR SLEEP 30 tablet 4   No current facility-administered medications on file prior to visit.     Objective:     Vitals:   07/26/22 0812  BP: (!) 158/101  Pulse: 96    Filed Weights   07/26/22 0812  Weight: 130 lb 12.8 oz (59.3 kg)              Physical examination General NAD, Conversant  HEENT Atraumatic; Op clear with mmm.  Normo-cephalic. Pupils reactive. Anicteric sclerae  Thyroid/Neck  Smooth without nodularity or enlargement. Normal ROM.  Neck Supple.  Skin No rashes, lesions or  ulceration. Normal palpated skin turgor. No nodularity.  Breasts: No masses or discharge.  Symmetric.  No axillary adenopathy.  Lungs: Clear to auscultation.No rales or wheezes. Normal Respiratory effort, no retractions.  Heart: NSR.  No murmurs or rubs appreciated. No periferal edema  Abdomen: Soft.  Non-tender.  No masses.  No HSM. No hernia  Extremities: Moves all appropriately.  Normal ROM for age. No lymphadenopathy.  Neuro: Oriented to PPT.  Normal mood. Normal affect.     Pelvic:   Vulva: Normal appearance.  No lesions.   Vagina: No lesions or abnormalities noted.  Support: Normal pelvic support.  (Excellent support)  Urethra No masses tenderness or scarring.  Meatus Normal size without lesions or prolapse.  Cervix: Surgically absent   Anus: Normal exam.  No lesions.  Perineum: Normal exam.  No lesions.        Bimanual   Uterus: Surgically absent   Adnexae: No masses.  Non-tender to palpation.  Cul-de-sac: Negative for abnormality.     Assessment:    [id number] Patient Active Problem List   Diagnosis Date Noted   Incomplete tear of rotator cuff 09/11/2019   Shoulder pain 09/11/2019   Stiffness of shoulder joint 09/11/2019   Strain of rotator cuff capsule 09/11/2019   Disorder of bursae of shoulder region 09/11/2019   Status post lumbar discectomy 05/07/2019   Hand weakness 06/26/2018   Numbness and tingling 05/30/2018   Bilateral hand pain 04/03/2018   Diplopia 04/03/2018   Numbness and tingling in both hands 04/03/2018   Special screening for malignant neoplasms, colon    Perimenopausal vasomotor symptoms 06/10/2015   Status post hysterectomy 06/10/2015   History of cervical dysplasia 06/10/2015   History of oophorectomy, unilateral 06/10/2015   Cholelithiasis 03/31/2015   Melanoma ([place]) 03/31/2015   Hx of abnormal cervical Pap smear 03/31/2015   Tinea versicolor 03/31/2015   Vitamin D deficiency 03/31/2015   Hyperlipemia 03/31/2015   Depression 03/31/2015    Acute anxiety 03/31/2015   Insomnia 03/31/2015   Hypertension 03/31/2015   Plantar fasciitis 03/31/2015   Frequent UTI 03/31/2015   Discoid eczema 03/31/2015     1. Well woman exam with routine gynecological exam   2. Screening mammogram for breast cancer   3. Symptomatic menopausal or female climacteric states     Patient with some stress urinary incontinence by history but good pelvic support.   Plan:            1.  Basic Screening Recommendations The basic screening recommendations for asymptomatic women were discussed with the patient during her visit.  The age-appropriate recommendations were discussed with her and the rational for the tests reviewed.  When I am informed by the patient that another primary care physician has previously obtained the age-appropriate tests and they are up-to-date, only outstanding tests are ordered and referrals given as necessary.  Abnormal results of tests will be discussed with her when all of her results are completed.  Routine preventative health maintenance measures emphasized: Exercise/Diet/Weight control, Tobacco Warnings, Alcohol/Substance use risks and Stress Management Mammogram ordered -Labs ordered 2.  Discussed rest urinary incontinence and sling procedure  She is not sure if she wants surgery as an option at this time but will inform us if she does.  Possible video visit discussion if she would like to talk about the subject further. 3.  Continue ERT Orders Orders Placed This Encounter  Procedures   Basic metabolic panel   CBC   Cholesterol, total   Hemoglobin A1c   Lipid panel     Meds ordered this encounter  Medications   estradiol (ESTRACE) 1 MG tablet    Sig: Take 1 tablet (1 mg total) by mouth daily.    Dispense:  90 tablet    Refill:  3           F/U  No follow-ups on file.  Finis Bud, M.D. 07/26/2022 8:37 AM

## 2022-07-26 NOTE — Progress Notes (Signed)
Patients presents for annual exam today. She states doing well with HRT. Patient is due for mammogram, ordered. Annual labs are ordered. She states no other questions or concerns at this time. Declined flu shot.

## 2022-08-03 ENCOUNTER — Encounter: Payer: Self-pay | Admitting: Obstetrics and Gynecology

## 2022-08-03 ENCOUNTER — Telehealth (INDEPENDENT_AMBULATORY_CARE_PROVIDER_SITE_OTHER): Payer: Commercial Managed Care - PPO | Admitting: Obstetrics and Gynecology

## 2022-08-03 DIAGNOSIS — N393 Stress incontinence (female) (male): Secondary | ICD-10-CM

## 2022-08-03 NOTE — Progress Notes (Signed)
virtual Visit via Video Note  I connected with Jill Porter on 08/03/22 at  7:45 AM EDT by video and verified that I was speaking with the correct person using two identifiers.    Jill Porter is a 56 y.o. [phone] who LMP was No LMP recorded. Patient has had a hysterectomy. I discussed the limitations, risks, security and privacy concerns of performing an evaluation and management service by video and the availability of in person appointments. I also discussed with the patient that there may be a patient responsible charge related to this service. The patient expressed understanding and agreed to proceed.  Location of patient: Home  Patient gave explicit verbal consent for video visit:  YES  Location of provider:  [facility] office  Persons other than physician and patient involved in provider conference:  None   Subjective:   History of Present Illness:    She has recently begun having issues with what seems to be stress urinary incontinence.  Her history is very consistent with SUI.  She has seen urology for this and they made the diagnosis of possible mixed urinary incontinence.  At her last visit we discussed management of both types of incontinence.  She would like to further discuss the possibility of sling procedure for SUI.  Hx: The following portions of the patient's history were reviewed and updated as appropriate:             She  has a past medical history of Cervical dysphagia, Climacteric, Hypertension, Melanoma ([place]), Motion sickness, Pelvic adhesions, and Recurrent cystitis. She does not have any pertinent problems on file. She  has a past surgical history that includes Cholecystectomy (06/19/2012); Melanoma excision (06/2012); Excision basal cell carcinoma (2013); Breast cyst aspiration (01/2010); Ovary surgery (1997); Abdominal hysterectomy (2000); Liposuction; Exploratory laparotomy; Rotator cuff repair; Colonoscopy with propofol (N/A, 07/06/2015); and Parathyroidectomy  (04/14/2017). Her family history includes Heart attack (age of onset: 68) in her maternal grandfather; Hypertension in her father and mother; Lymphoma in her mother. She  reports that she has never smoked. She has never been exposed to tobacco smoke. She has never used smokeless tobacco. She reports that she does not drink alcohol and does not use drugs. She has a current medication list which includes the following prescription(s): estradiol, hydrochlorothiazide, losartan, and zolpidem. She is allergic to sulfa antibiotics, tape, and penicillins.       Review of Systems:  Review of Systems  Constitutional: Denied constitutional symptoms, night sweats, recent illness, fatigue, fever, insomnia and weight loss.  Eyes: Denied eye symptoms, eye pain, photophobia, vision change and visual disturbance.  Ears/Nose/Throat/Neck: Denied ear, nose, throat or neck symptoms, hearing loss, nasal discharge, sinus congestion and sore throat.  Cardiovascular: Denied cardiovascular symptoms, arrhythmia, chest pain/pressure, edema, exercise intolerance, orthopnea and palpitations.  Respiratory: Denied pulmonary symptoms, asthma, pleuritic pain, productive sputum, cough, dyspnea and wheezing.  Gastrointestinal: Denied, gastro-esophageal reflux, melena, nausea and vomiting.  Genitourinary: See HPI for additional information.  Musculoskeletal: Denied musculoskeletal symptoms, stiffness, swelling, muscle weakness and myalgia.  Dermatologic: Denied dermatology symptoms, rash and scar.  Neurologic: Denied neurology symptoms, dizziness, headache, neck pain and syncope.  Psychiatric: Denied psychiatric symptoms, anxiety and depression.  Endocrine: Denied endocrine symptoms including hot flashes and night sweats.   Meds:   Current Outpatient Medications on File Prior to Visit  Medication Sig Dispense Refill   estradiol (ESTRACE) 1 MG tablet Take 1 tablet (1 mg total) by mouth daily. 90 tablet 3   hydrochlorothiazide  (  HYDRODIURIL) 25 MG tablet TAKE 1 TABLET BY MOUTH EVERY DAY 90 tablet 3   losartan (COZAAR) 100 MG tablet TAKE 1/2 TABLET BY MOUTH EVERY DAY 45 tablet 1   zolpidem (AMBIEN) 10 MG tablet TAKE 1 TABLET (10 MG TOTAL) BY MOUTH AT BEDTIME AS NEEDED. FOR SLEEP 30 tablet 4   No current facility-administered medications on file prior to visit.    Assessment:    [id number] Patient Active Problem List   Diagnosis Date Noted   Incomplete tear of rotator cuff 09/11/2019   Shoulder pain 09/11/2019   Stiffness of shoulder joint 09/11/2019   Strain of rotator cuff capsule 09/11/2019   Disorder of bursae of shoulder region 09/11/2019   Status post lumbar discectomy 05/07/2019   Hand weakness 06/26/2018   Numbness and tingling 05/30/2018   Bilateral hand pain 04/03/2018   Diplopia 04/03/2018   Numbness and tingling in both hands 04/03/2018   Special screening for malignant neoplasms, colon    Perimenopausal vasomotor symptoms 06/10/2015   Status post hysterectomy 06/10/2015   History of cervical dysplasia 06/10/2015   History of oophorectomy, unilateral 06/10/2015   Cholelithiasis 03/31/2015   Melanoma ([place]) 03/31/2015   Hx of abnormal cervical Pap smear 03/31/2015   Tinea versicolor 03/31/2015   Vitamin D deficiency 03/31/2015   Hyperlipemia 03/31/2015   Depression 03/31/2015   Acute anxiety 03/31/2015   Insomnia 03/31/2015   Hypertension 03/31/2015   Plantar fasciitis 03/31/2015   Frequent UTI 03/31/2015   Discoid eczema 03/31/2015     1. SUI (stress urinary incontinence, female)       Plan:            1.  She had done some research regarding treatment of stress urinary incontinence and had multiple appropriate questions.  We discussed the use of mesh and the sling procedure and we discussed the procedure itself.  Recovery time, over time, hospital time and refraining from heavy lifting were all discussed.  The possibility of mixed incontinence was discussed and a trial of medication  was offered, but I believe her main issue is stress urinary incontinence and this is unlikely to be satisfactory for her.  She declined medication at this time.  She would like to move forward with scheduling surgery for her stress urinary incontinence. Orders No orders of the defined types were placed in this encounter.   No orders of the defined types were placed in this encounter.     F/U  Return in about 4 weeks (around 08/31/2022). I spent 22 minutes involved in the care of this patient preparing to see the patient by obtaining and reviewing her medical history (including labs, imaging tests and prior procedures), documenting clinical information in the electronic health record (EHR), counseling and coordinating care plans, writing and sending prescriptions, ordering tests or procedures and in direct communicating with the patient and medical staff discussing pertinent items from her history and physical exam.  Finis Bud, M.D. 08/03/2022 8:18 AM

## 2022-08-24 ENCOUNTER — Telehealth: Payer: Self-pay | Admitting: Obstetrics and Gynecology

## 2022-08-24 NOTE — Telephone Encounter (Signed)
I contacted patient via phone and left message for patient to call back to be scheduled. Appt time in 30 mins

## 2022-08-24 NOTE — Telephone Encounter (Signed)
-----   Message from Marykay Lex, [place] sent at 08/24/2022  4:18 PM EST ----- Regarding: pre-op Clarise Cruz, This was scheduled for a pre-op but only 15 minutes, it needs to be scheduled for 30 minutes please.  Thank you

## 2022-08-30 ENCOUNTER — Ambulatory Visit: Payer: Self-pay | Admitting: *Deleted

## 2022-08-30 ENCOUNTER — Telehealth: Payer: Commercial Managed Care - PPO | Admitting: Family Medicine

## 2022-08-30 DIAGNOSIS — U071 COVID-19: Secondary | ICD-10-CM

## 2022-08-30 MED ORDER — BENZONATATE 100 MG PO CAPS: 200.0000 mg | ORAL_CAPSULE | Freq: Two times a day (BID) | ORAL | 0 refills | Status: DC | PRN

## 2022-08-30 MED ORDER — PROMETHAZINE-DM 6.25-15 MG/5ML PO SYRP: 5.0000 mL | ORAL_SOLUTION | Freq: Three times a day (TID) | ORAL | 0 refills | Status: DC | PRN

## 2022-08-30 NOTE — Progress Notes (Signed)
Virtual Visit Consent   Jill Porter, you are scheduled for a virtual visit with a [facility] provider today. Just as with appointments in the office, your consent must be obtained to participate. Your consent will be active for this visit and any virtual visit you may have with one of our providers in the next 365 days. If you have a MyChart account, a copy of this consent can be sent to you electronically.  As this is a virtual visit, video technology does not allow for your provider to perform a traditional examination. This may limit your provider's ability to fully assess your condition. If your provider identifies any concerns that need to be evaluated in person or the need to arrange testing (such as labs, EKG, etc.), we will make arrangements to do so. Although advances in technology are sophisticated, we cannot ensure that it will always work on either your end or our end. If the connection with a video visit is poor, the visit may have to be switched to a telephone visit. With either a video or telephone visit, we are not always able to ensure that we have a secure connection.  By engaging in this virtual visit, you consent to the provision of healthcare and authorize for your insurance to be billed (if applicable) for the services provided during this visit. Depending on your insurance coverage, you may receive a charge related to this service.  I need to obtain your verbal consent now. Are you willing to proceed with your visit today? [name] has provided verbal consent on 08/30/2022 for a virtual visit (video or telephone). [name], NP  Date: 08/30/2022 1:38 PM  Virtual Visit via Video Note   I, [name], connected with  Jill Porter  ([id number], 1966-04-16) on 08/30/22 at  1:45 PM EST by a video-enabled telemedicine application and verified that I am speaking with the correct person using two identifiers.  Location: Patient: Virtual Visit Location Patient:  Home Provider: Virtual Visit Location Provider: Home Office   I discussed the limitations of evaluation and management by telemedicine and the availability of in person appointments. The patient expressed understanding and agreed to proceed.    History of Present Illness: Jill Porter is a 56 y.o. who identifies as a female who was assigned female at birth, and is being seen today for COVID + symptoms started on Saturday.  Starting with stomach upset, and fatigued, which progressed to chills and body aches. T max of 101 over the weekend. Congestion, cough- green mild drainage, scratchy throat.  Tested positive this morning- but had tested 2 twice before and those were negative. Denies chest pain, shortness of breath, ear pain. Has been taking ny quil and tylenol.   Problems:  Patient Active Problem List   Diagnosis Date Noted   Incomplete tear of rotator cuff 09/11/2019   Shoulder pain 09/11/2019   Stiffness of shoulder joint 09/11/2019   Strain of rotator cuff capsule 09/11/2019   Disorder of bursae of shoulder region 09/11/2019   Status post lumbar discectomy 05/07/2019   Hand weakness 06/26/2018   Numbness and tingling 05/30/2018   Bilateral hand pain 04/03/2018   Diplopia 04/03/2018   Numbness and tingling in both hands 04/03/2018   Special screening for malignant neoplasms, colon    Perimenopausal vasomotor symptoms 06/10/2015   Status post hysterectomy 06/10/2015   History of cervical dysplasia 06/10/2015   History of oophorectomy, unilateral 06/10/2015   Cholelithiasis 03/31/2015  Melanoma ([place]) 03/31/2015   Hx of abnormal cervical Pap smear 03/31/2015   Tinea versicolor 03/31/2015   Vitamin D deficiency 03/31/2015   Hyperlipemia 03/31/2015   Depression 03/31/2015   Acute anxiety 03/31/2015   Insomnia 03/31/2015   Hypertension 03/31/2015   Plantar fasciitis 03/31/2015   Frequent UTI 03/31/2015   Discoid eczema 03/31/2015    Allergies:  Allergies  Allergen  Reactions   Sulfa Antibiotics Hives   Tape    Penicillins Itching and Rash   Medications:  Current Outpatient Medications:    estradiol (ESTRACE) 1 MG tablet, Take 1 tablet (1 mg total) by mouth daily., Disp: 90 tablet, Rfl: 3   hydrochlorothiazide (HYDRODIURIL) 25 MG tablet, TAKE 1 TABLET BY MOUTH EVERY DAY, Disp: 90 tablet, Rfl: 3   losartan (COZAAR) 100 MG tablet, TAKE 1/2 TABLET BY MOUTH EVERY DAY, Disp: 45 tablet, Rfl: 1   zolpidem (AMBIEN) 10 MG tablet, TAKE 1 TABLET (10 MG TOTAL) BY MOUTH AT BEDTIME AS NEEDED. FOR SLEEP, Disp: 30 tablet, Rfl: 4  Observations/Objective: Patient is well-developed, well-nourished in no acute distress.  Resting comfortably  at home.  Head is normocephalic, atraumatic.  No labored breathing.  Speech is clear and coherent with logical content.  Patient is alert and oriented at baseline.  Noted congestion tone  Assessment and Plan:  1. COVID-19  - promethazine-dextromethorphan (PROMETHAZINE-DM) 6.25-15 MG/5ML syrup; Take 5 mLs by mouth 3 (three) times daily as needed for cough.  Dispense: 118 mL; Refill: 0 - benzonatate (TESSALON) 100 MG capsule; Take 2 capsules (200 mg total) by mouth 2 (two) times daily as needed for cough.  Dispense: 20 capsule; Refill: 0  -Take meds as prescribed -COVID info on AVS -Rest -Use a cool mist humidifier especially during the winter months when heat dries out the air. - Use saline nose sprays frequently to help soothe nasal passages and promote drainage. -Saline irrigations of the nose can be very helpful if done frequently.             * 4X daily for 1 week*             * Use of a nettie pot can be helpful with this.  *Follow directions with this* *Boiled or distilled water only -stay hydrated by drinking plenty of fluids - Keep thermostat turn down low to prevent drying out sinuses - For any cough or congestion- robitussin DM or Delsym as needed - For fever or aches or pains- take tylenol or ibuprofen as  directed on bottle             * for fevers greater than 101 orally you may alternate ibuprofen and tylenol every 3 hours.  If you do not improve you will need a follow up visit in person.                 Reviewed side effects, risks and benefits of medication.    Patient acknowledged agreement and understanding of the plan.   Past Medical, Surgical, Social History, Allergies, and Medications have been Reviewed.    Follow Up Instructions: I discussed the assessment and treatment plan with the patient. The patient was provided an opportunity to ask questions and all were answered. The patient agreed with the plan and demonstrated an understanding of the instructions.  A copy of instructions were sent to the patient via MyChart unless otherwise noted below.     The patient was advised to call back or seek an in-person evaluation if the  symptoms worsen or if the condition fails to improve as anticipated.  Time:  I spent 10 minutes with the patient via telehealth technology discussing the above problems/concerns.    [name], NP

## 2022-08-30 NOTE — Telephone Encounter (Signed)
Summary: Covid positive + symptoms   Pt called to report that she has tested positive for covid; she has body aches, chills, cough/congestion diarrhea.  [id number] [id number]: [phone]: [phone] Declined appt since PCP is gone      Chief Complaint: Covid Positive Symptoms: Chills, cough, body aches, diarrhea, congestion Frequency: Saturday evening Pertinent Negatives: Patient denies SOB Disposition: '[]'$ ED /'[]'$ Urgent Care (no appt availability in office) / '[]'$ Appointment(In office/virtual)/ '[x]'$  [facility]/ '[]'$ Home Care/ '[]'$ Refused Recommended Disposition /'[]'$ [facility]/ '[]'$  Follow-up with PCP Additional Notes: Pt interested in oral anti viral med. No availability at practice, OV or virtual. Secured [facility] for pt for today. Care advise provided, reviewed self isolation guidelines. Pt verbalizes understanding. Reason for Disposition  Fever present > 3 days (72 hours)    Subjective fever  Answer Assessment - Initial Assessment Questions 1. COVID-19 DIAGNOSIS: "How do you know that you have COVID?" (e.g., positive lab test or self-test, diagnosed by doctor or NP/PA, symptoms after exposure).     Home, today 2. COVID-19 EXPOSURE: "Was there any known exposure to COVID before the symptoms began?" CDC Definition of close contact: within 6 feet (2 meters) for a total of 15 minutes or more over a 24-hour period.      No 3. ONSET: "When did the COVID-19 symptoms start?"      Last Saturday 4. WORST SYMPTOM: "What is your worst symptom?" (e.g., cough, fever, shortness of breath, muscle aches)     NA 5. COUGH: "Do you have a cough?" If Yes, ask: "How bad is the cough?"       Yes, little greenish mucous. Off and on 6. FEVER: "Do you have a fever?" If Yes, ask: "What is your temperature, how was it measured, and when did it start?"     Chills 7. RESPIRATORY STATUS: "Describe your  breathing?" (e.g., normal; shortness of breath, wheezing, unable to speak)      SOB mild 8. BETTER-SAME-WORSE: "Are you getting better, staying the same or getting worse compared to yesterday?"  If getting worse, ask, "In what way?"     NA 9. OTHER SYMPTOMS: "Do you have any other symptoms?"  (e.g., chills, fatigue, headache, loss of smell or taste, muscle pain, sore throat)     Body aches, diarrhea, scratchy throat 10. HIGH RISK DISEASE: "Do you have any chronic medical problems?" (e.g., asthma, heart or lung disease, weak immune system, obesity, etc.)        11. VACCINE: "Have you had the COVID-19 vaccine?" If Yes, ask: "Which one, how many shots, when did you get it?"       Very first 2 vaccines  Protocols used: Coronavirus (COVID-19) Diagnosed or Suspected-A-AH

## 2022-08-30 NOTE — Patient Instructions (Signed)
Jill Porter, thank you for joining [name], NP for today's virtual visit.  While this provider is not your primary care provider (PCP), if your PCP is located in our provider database this encounter information will be shared with them immediately following your visit.   [facility] account gives you access to today's visit and all your visits, tests, and labs performed at [facility] " click here if you don't have a [facility] account or go to [email or web address]  Consent: (Patient) Evelynne Lorinda Porter provided verbal consent for this virtual visit at the beginning of the encounter.  Current Medications:  Current Outpatient Medications:    benzonatate (TESSALON) 100 MG capsule, Take 2 capsules (200 mg total) by mouth 2 (two) times daily as needed for cough., Disp: 20 capsule, Rfl: 0   promethazine-dextromethorphan (PROMETHAZINE-DM) 6.25-15 MG/5ML syrup, Take 5 mLs by mouth 3 (three) times daily as needed for cough., Disp: 118 mL, Rfl: 0   estradiol (ESTRACE) 1 MG tablet, Take 1 tablet (1 mg total) by mouth daily., Disp: 90 tablet, Rfl: 3   hydrochlorothiazide (HYDRODIURIL) 25 MG tablet, TAKE 1 TABLET BY MOUTH EVERY DAY, Disp: 90 tablet, Rfl: 3   losartan (COZAAR) 100 MG tablet, TAKE 1/2 TABLET BY MOUTH EVERY DAY, Disp: 45 tablet, Rfl: 1   zolpidem (AMBIEN) 10 MG tablet, TAKE 1 TABLET (10 MG TOTAL) BY MOUTH AT BEDTIME AS NEEDED. FOR SLEEP, Disp: 30 tablet, Rfl: 4   Medications ordered in this encounter:  Meds ordered this encounter  Medications   promethazine-dextromethorphan (PROMETHAZINE-DM) 6.25-15 MG/5ML syrup    Sig: Take 5 mLs by mouth 3 (three) times daily as needed for cough.    Dispense:  118 mL    Refill:  0    Order Specific Question:   Supervising Provider    Answer:   Chase Picket [[id number]]   benzonatate (TESSALON) 100 MG capsule    Sig: Take 2 capsules (200 mg total) by mouth 2 (two) times daily as needed for cough.     Dispense:  20 capsule    Refill:  0    Order Specific Question:   Supervising Provider    Answer:   Chase Picket [id number]     *If you need refills on other medications prior to your next appointment, please contact your pharmacy*  Follow-Up: Call back or seek an in-person evaluation if the symptoms worsen or if the condition fails to improve as anticipated.  [facility] [phone]  Other Instructions  Please keep well-hydrated and get plenty of rest. Start a saline nasal rinse to flush out your nasal passages. You can use plain Mucinex to help thin congestion. If you have a humidifier, running in the bedroom at night. I want you to start OTC vitamin D3 1000 units daily, vitamin C 1000 mg daily, and a zinc supplement. Please take prescribed medications as directed.  You have been enrolled in a MyChart symptom monitoring program. Please answer these questions daily so we can keep track of how you are doing.  You were to quarantine for 5 days from onset of your symptoms.  After day 5, if you have had no fever and you are feeling better, you can end quarantine but need to mask for an additional 5 days. After day 5 if you have a fever or are having significant symptoms, please quarantine for full 10 days.  If you note any worsening of symptoms, any significant shortness of  breath or any chest pain, please seek ER evaluation ASAP.  Please do not delay care!  COVID-19: What to Do if You Are Sick If you test positive and are an older adult or someone who is at high risk of getting very sick from COVID-19, treatment may be available. Contact a healthcare provider right away after a positive test to determine if you are eligible, even if your symptoms are mild right now. You can also visit a Test to Treat location and, if eligible, receive a prescription from a provider. Don't delay: Treatment must be started within the first few days to be effective. If you have a fever,  cough, or other symptoms, you might have COVID-19. Most people have mild illness and are able to recover at home. If you are sick: Keep track of your symptoms. If you have an emergency warning sign (including trouble breathing), call 911. Steps to help prevent the spread of COVID-19 if you are sick If you are sick with COVID-19 or think you might have COVID-19, follow the steps below to care for yourself and to help protect other people in your home and community. Stay home except to get medical care Stay home. Most people with COVID-19 have mild illness and can recover at home without medical care. Do not leave your home, except to get medical care. Do not visit public areas and do not go to places where you are unable to wear a mask. Take care of yourself. Get rest and stay hydrated. Take over-the-counter medicines, such as acetaminophen, to help you feel better. Stay in touch with your doctor. Call before you get medical care. Be sure to get care if you have trouble breathing, or have any other emergency warning signs, or if you think it is an emergency. Avoid public transportation, ride-sharing, or taxis if possible. Get tested If you have symptoms of COVID-19, get tested. While waiting for test results, stay away from others, including staying apart from those living in your household. Get tested as soon as possible after your symptoms start. Treatments may be available for people with COVID-19 who are at risk for becoming very sick. Don't delay: Treatment must be started early to be effective--some treatments must begin within 5 days of your first symptoms. Contact your healthcare provider right away if your test result is positive to determine if you are eligible. Self-tests are one of several options for testing for the virus that causes COVID-19 and may be more convenient than laboratory-based tests and point-of-care tests. Ask your healthcare provider or your local health department if you  need help interpreting your test results. You can visit your state, tribal, local, and territorial health department's website to look for the latest local information on testing sites. Separate yourself from other people As much as possible, stay in a specific room and away from other people and pets in your home. If possible, you should use a separate bathroom. If you need to be around other people or animals in or outside of the home, wear a well-fitting mask. Tell your close contacts that they may have been exposed to COVID-19. An infected person can spread COVID-19 starting 48 hours (or 2 days) before the person has any symptoms or tests positive. By letting your close contacts know they may have been exposed to COVID-19, you are helping to protect everyone. See COVID-19 and Animals if you have questions about pets. If you are diagnosed with COVID-19, someone from the health department may call you.  Answer the call to slow the spread. Monitor your symptoms Symptoms of COVID-19 include fever, cough, or other symptoms. Follow care instructions from your healthcare provider and local health department. Your local health authorities may give instructions on checking your symptoms and reporting information. When to seek emergency medical attention Look for emergency warning signs* for COVID-19. If someone is showing any of these signs, seek emergency medical care immediately: Trouble breathing Persistent pain or pressure in the chest New confusion Inability to wake or stay awake Pale, gray, or blue-colored skin, lips, or nail beds, depending on skin tone *This list is not all possible symptoms. Please call your medical provider for any other symptoms that are severe or concerning to you. Call 911 or call ahead to your local emergency facility: Notify the operator that you are seeking care for someone who has or may have COVID-19. Call ahead before visiting your doctor Call ahead. Many medical  visits for routine care are being postponed or done by phone or telemedicine. If you have a medical appointment that cannot be postponed, call your doctor's office, and tell them you have or may have COVID-19. This will help the office protect themselves and other patients. If you are sick, wear a well-fitting mask You should wear a mask if you must be around other people or animals, including pets (even at home). Wear a mask with the best fit, protection, and comfort for you. You don't need to wear the mask if you are alone. If you can't put on a mask (because of trouble breathing, for example), cover your coughs and sneezes in some other way. Try to stay at least 6 feet away from other people. This will help protect the people around you. Masks should not be placed on young children under age 26 years, anyone who has trouble breathing, or anyone who is not able to remove the mask without help. Cover your coughs and sneezes Cover your mouth and nose with a tissue when you cough or sneeze. Throw away used tissues in a lined trash can. Immediately wash your hands with soap and water for at least 20 seconds. If soap and water are not available, clean your hands with an alcohol-based hand sanitizer that contains at least 60% alcohol. Clean your hands often Wash your hands often with soap and water for at least 20 seconds. This is especially important after blowing your nose, coughing, or sneezing; going to the bathroom; and before eating or preparing food. Use hand sanitizer if soap and water are not available. Use an alcohol-based hand sanitizer with at least 60% alcohol, covering all surfaces of your hands and rubbing them together until they feel dry. Soap and water are the best option, especially if hands are visibly dirty. Avoid touching your eyes, nose, and mouth with unwashed hands. Handwashing Tips Avoid sharing personal household items Do not share dishes, drinking glasses, cups, eating  utensils, towels, or bedding with other people in your home. Wash these items thoroughly after using them with soap and water or put in the dishwasher. Clean surfaces in your home regularly Clean and disinfect high-touch surfaces (for example, doorknobs, tables, handles, light switches, and countertops) in your "sick room" and bathroom. In shared spaces, you should clean and disinfect surfaces and items after each use by the person who is ill. If you are sick and cannot clean, a caregiver or other person should only clean and disinfect the area around you (such as your bedroom and bathroom) on an as  needed basis. Your caregiver/other person should wait as long as possible (at least several hours) and wear a mask before entering, cleaning, and disinfecting shared spaces that you use. Clean and disinfect areas that may have blood, stool, or body fluids on them. Use household cleaners and disinfectants. Clean visible dirty surfaces with household cleaners containing soap or detergent. Then, use a household disinfectant. Use a product from [employer] List N: Disinfectants for Coronavirus (OBSJG-28). Be sure to follow the instructions on the label to ensure safe and effective use of the product. Many products recommend keeping the surface wet with a disinfectant for a certain period of time (look at "contact time" on the product label). You may also need to wear personal protective equipment, such as gloves, depending on the directions on the product label. Immediately after disinfecting, wash your hands with soap and water for 20 seconds. For completed guidance on cleaning and disinfecting your home, visit Complete Disinfection Guidance. Take steps to improve ventilation at home Improve ventilation (air flow) at home to help prevent from spreading COVID-19 to other people in your household. Clear out COVID-19 virus particles in the air by opening windows, using air filters, and turning on fans in your  home. Use this interactive tool to learn how to improve air flow in your home. When you can be around others after being sick with COVID-19 Deciding when you can be around others is different for different situations. Find out when you can safely end home isolation. For any additional questions about your care, contact your healthcare provider or state or local health department. 12/22/2020 Content source: [facility] for Immunization and Respiratory Diseases ([facility]), Division of Viral Diseases This information is not intended to replace advice given to you by your health care provider. Make sure you discuss any questions you have with your health care provider. Document Revised: 02/04/2021 Document Reviewed: 02/04/2021 Elsevier Patient Education  2022 [employer].     If you have been instructed to have an in-person evaluation today at a local Urgent Care facility, please use the link below. It will take you to a list of all of our available [facility], including address, phone number and hours of operation. Please do not delay care.  [facility]  If you or a family member do not have a primary care provider, use the link below to schedule a visit and establish care. When you choose a [facility] primary care physician or advanced practice provider, you gain a long-term partner in health. Find a Primary Care Provider  Learn more about 's in-office and virtual care options: [facility]

## 2022-08-31 ENCOUNTER — Encounter: Payer: Commercial Managed Care - PPO | Admitting: Obstetrics and Gynecology

## 2022-09-12 ENCOUNTER — Encounter: Payer: Commercial Managed Care - PPO | Admitting: Family Medicine

## 2022-09-21 ENCOUNTER — Encounter: Payer: Self-pay | Admitting: Obstetrics and Gynecology

## 2022-09-21 ENCOUNTER — Ambulatory Visit (INDEPENDENT_AMBULATORY_CARE_PROVIDER_SITE_OTHER): Payer: Commercial Managed Care - PPO | Admitting: Obstetrics and Gynecology

## 2022-09-21 VITALS — BP 170/110 | HR 97 | Ht 64.0 in | Wt 128.4 lb

## 2022-09-21 DIAGNOSIS — N393 Stress incontinence (female) (male): Secondary | ICD-10-CM | POA: Diagnosis not present

## 2022-09-21 DIAGNOSIS — Z01818 Encounter for other preprocedural examination: Secondary | ICD-10-CM

## 2022-09-21 DIAGNOSIS — N8110 Cystocele, unspecified: Secondary | ICD-10-CM | POA: Diagnosis not present

## 2022-09-21 MED ORDER — METRONIDAZOLE 500 MG PO TABS: 500.0000 mg | ORAL_TABLET | Freq: Two times a day (BID) | ORAL | 0 refills | Status: DC

## 2022-09-21 NOTE — H&P (Signed)
PRE-OPERATIVE HISTORY AND PHYSICAL EXAM  PCP:  [name]., MD Subjective:   HPI:  Jill Porter is a 56 y.o. [phone].  No LMP recorded. Patient has had a hysterectomy.  She presents today for a pre-op discussion and PE.  She has the following symptoms: Stress urinary incontinence.  Leakage with coughing laughing sneezing.  She feels as if this has become a significant issue for her and she would like it fixed.  Of significant note she has had a prior hysterectomy.  Review of Systems:   Constitutional: Denied constitutional symptoms, night sweats, recent illness, fatigue, fever, insomnia and weight loss.  Eyes: Denied eye symptoms, eye pain, photophobia, vision change and visual disturbance.  Ears/Nose/Throat/Neck: Denied ear, nose, throat or neck symptoms, hearing loss, nasal discharge, sinus congestion and sore throat.  Cardiovascular: Denied cardiovascular symptoms, arrhythmia, chest pain/pressure, edema, exercise intolerance, orthopnea and palpitations.  Respiratory: Denied pulmonary symptoms, asthma, pleuritic pain, productive sputum, cough, dyspnea and wheezing.  Gastrointestinal: Denied, gastro-esophageal reflux, melena, nausea and vomiting.  Genitourinary: See HPI for additional information.  Musculoskeletal: Denied musculoskeletal symptoms, stiffness, swelling, muscle weakness and myalgia.  Dermatologic: Denied dermatology symptoms, rash and scar.  Neurologic: Denied neurology symptoms, dizziness, headache, neck pain and syncope.  Psychiatric: Denied psychiatric symptoms, anxiety and depression.  Endocrine: Denied endocrine symptoms including hot flashes and night sweats.   OB History  Gravida Para Term Preterm AB Living  '4 4 4     4  '$ SAB IAB Ectopic Multiple Live Births          4    # Outcome Date GA Lbr Len/2nd Weight Sex Delivery Anes PTL Lv  4 Term 1993   8 lb 6.4 oz (3.81 kg) M Vag-Spont   LIV  3 Term 1990   7 lb 6.4 oz (3.357 kg) F Vag-Spont   LIV   2 Term 1987   6 lb 9.6 oz (2.994 kg) M Vag-Spont   LIV  1 Term 1984   6 lb 9.6 oz (2.994 kg) M Vag-Spont   LIV    Past Medical History:  Diagnosis Date   Cervical dysphagia    Climacteric    Hypertension    Melanoma ([place])    Motion sickness    anytime moving   Pelvic adhesions    Recurrent cystitis     Past Surgical History:  Procedure Laterality Date   ABDOMINAL HYSTERECTOMY  2000   due to pre cancerous cells   BASAL CELL CARCINOMA EXCISION  2013   removed from forehead and Lt [place]   BREAST CYST ASPIRATION  01/2010   CHOLECYSTECTOMY  06/19/2012   COLONOSCOPY WITH PROPOFOL N/A 07/06/2015   Procedure: COLONOSCOPY WITH PROPOFOL;  Surgeon: [name], MD;  Location: [facility];  Service: Endoscopy;  Laterality: N/A;   EXPLORATORY LAPAROTOMY     lysis of adhesions   LIPOSUCTION     MELANOMA EXCISION  06/2012   Removed from Rt shoulder   OVARY SURGERY  1997   hx of removal of ovary and adhesions   PARATHYROIDECTOMY  04/14/2017   ROTATOR CUFF REPAIR        SOCIAL HISTORY:  Social History   Tobacco Use  Smoking Status Never   Passive exposure: Never  Smokeless Tobacco Never   Social History   Substance and Sexual Activity  Alcohol Use No   Alcohol/week: 0.0 standard drinks of alcohol    Social History   Substance and Sexual Activity  Drug Use No    Family History  Problem Relation Age of Onset   Hypertension Mother    Lymphoma Mother    Hypertension Father    Heart attack Maternal Grandfather 15       MI   Cancer Neg Hx    Diabetes Neg Hx    Heart disease Neg Hx    Kidney cancer Neg Hx    Bladder Cancer Neg Hx     ALLERGIES:  Sulfa antibiotics, Tape, and Penicillins  MEDS:   Current Outpatient Medications on File Prior to Visit  Medication Sig Dispense Refill   estradiol (ESTRACE) 1 MG tablet Take 1 tablet (1 mg total) by mouth daily. 90 tablet 3   hydrochlorothiazide (HYDRODIURIL) 25 MG tablet TAKE 1 TABLET BY MOUTH EVERY DAY  90 tablet 3   losartan (COZAAR) 100 MG tablet TAKE 1/2 TABLET BY MOUTH EVERY DAY 45 tablet 1   zolpidem (AMBIEN) 10 MG tablet TAKE 1 TABLET (10 MG TOTAL) BY MOUTH AT BEDTIME AS NEEDED. FOR SLEEP 30 tablet 4   No current facility-administered medications on file prior to visit.    No orders of the defined types were placed in this encounter.    Physical examination BP (!) 170/110   Pulse 97   Ht '5\' 4"'$  (1.626 m)   Wt 128 lb 6.4 oz (58.2 kg)   BMI 22.04 kg/m   General NAD, Conversant  HEENT Atraumatic; Op clear with mmm.  Normo-cephalic. Pupils reactive. Anicteric sclerae  Thyroid/Neck Smooth without nodularity or enlargement. Normal ROM.  Neck Supple.  Skin No rashes, lesions or ulceration. Normal palpated skin turgor. No nodularity.  Breasts: No masses or discharge.  Symmetric.  No axillary adenopathy.  Lungs: Clear to auscultation.No rales or wheezes. Normal Respiratory effort, no retractions.  Heart: NSR.  No murmurs or rubs appreciated. No periferal edema  Abdomen: Soft.  Non-tender.  No masses.  No HSM. No hernia  Extremities: Moves all appropriately.  Normal ROM for age. No lymphadenopathy.  Neuro: Oriented to PPT.  Normal mood. Normal affect.     Pelvic:   Vulva: Normal appearance.  No lesions.  Vagina: No lesions or abnormalities noted.  Support: Second-degree cystocele  Urethra No masses tenderness or scarring.  Meatus Normal size without lesions or prolapse.  Cervix: Surgically absent  Anus: Normal exam.  No lesions.  Perineum: Normal exam.  No lesions.        Bimanual   Uterus: Surgically absent  Adnexae: No masses.  Non-tender to palpation.  Cul-de-sac: Negative for abnormality.   Assessment:   [id number] Patient Active Problem List   Diagnosis Date Noted   Incomplete tear of rotator cuff 09/11/2019   Shoulder pain 09/11/2019   Stiffness of shoulder joint 09/11/2019   Strain of rotator cuff capsule 09/11/2019   Disorder of bursae of shoulder region  09/11/2019   Status post lumbar discectomy 05/07/2019   Hand weakness 06/26/2018   Numbness and tingling 05/30/2018   Bilateral hand pain 04/03/2018   Diplopia 04/03/2018   Numbness and tingling in both hands 04/03/2018   Special screening for malignant neoplasms, colon    Perimenopausal vasomotor symptoms 06/10/2015   Status post hysterectomy 06/10/2015   History of cervical dysplasia 06/10/2015   History of oophorectomy, unilateral 06/10/2015   Cholelithiasis 03/31/2015   Melanoma ([place]) 03/31/2015   Hx of abnormal cervical Pap smear 03/31/2015   Tinea versicolor 03/31/2015   Vitamin D deficiency 03/31/2015   Hyperlipemia 03/31/2015   Depression 03/31/2015  Acute anxiety 03/31/2015   Insomnia 03/31/2015   Hypertension 03/31/2015   Plantar fasciitis 03/31/2015   Frequent UTI 03/31/2015   Discoid eczema 03/31/2015    1. Cystocele with rectocele   2. SUI (stress urinary incontinence, female)   3. Preop examination      Plan:   Orders: No orders of the defined types were placed in this encounter.    1.  Anterior repair with TOT.  Possible posterior repair.   medical staff discussing pertinent items from her history and physical exam.  Finis Bud, M.D. 09/21/2022 3:47 PM

## 2022-09-21 NOTE — Progress Notes (Signed)
Patient presents today for a pre-op exam. She states no additional concerns today.  

## 2022-09-21 NOTE — Progress Notes (Signed)
PRE-OPERATIVE HISTORY AND PHYSICAL EXAM  PCP:  [name]., MD Subjective:   HPI:  Jill Porter is a 56 y.o. [phone].  No LMP recorded. Patient has had a hysterectomy.  She presents today for a pre-op discussion and PE.  She has the following symptoms: Stress urinary incontinence.  Leakage with coughing laughing sneezing.  She feels as if this has become a significant issue for her and she would like it fixed.  Of significant note she has had a prior hysterectomy.  Review of Systems:   Constitutional: Denied constitutional symptoms, night sweats, recent illness, fatigue, fever, insomnia and weight loss.  Eyes: Denied eye symptoms, eye pain, photophobia, vision change and visual disturbance.  Ears/Nose/Throat/Neck: Denied ear, nose, throat or neck symptoms, hearing loss, nasal discharge, sinus congestion and sore throat.  Cardiovascular: Denied cardiovascular symptoms, arrhythmia, chest pain/pressure, edema, exercise intolerance, orthopnea and palpitations.  Respiratory: Denied pulmonary symptoms, asthma, pleuritic pain, productive sputum, cough, dyspnea and wheezing.  Gastrointestinal: Denied, gastro-esophageal reflux, melena, nausea and vomiting.  Genitourinary: See HPI for additional information.  Musculoskeletal: Denied musculoskeletal symptoms, stiffness, swelling, muscle weakness and myalgia.  Dermatologic: Denied dermatology symptoms, rash and scar.  Neurologic: Denied neurology symptoms, dizziness, headache, neck pain and syncope.  Psychiatric: Denied psychiatric symptoms, anxiety and depression.  Endocrine: Denied endocrine symptoms including hot flashes and night sweats.   OB History  Gravida Para Term Preterm AB Living  '4 4 4     4  '$ SAB IAB Ectopic Multiple Live Births          4    # Outcome Date GA Lbr Len/2nd Weight Sex Delivery Anes PTL Lv  4 Term 1993   8 lb 6.4 oz (3.81 kg) M Vag-Spont   LIV  3 Term 1990   7 lb 6.4 oz (3.357 kg) F Vag-Spont   LIV   2 Term 1987   6 lb 9.6 oz (2.994 kg) M Vag-Spont   LIV  1 Term 1984   6 lb 9.6 oz (2.994 kg) M Vag-Spont   LIV    Past Medical History:  Diagnosis Date   Cervical dysphagia    Climacteric    Hypertension    Melanoma ([place])    Motion sickness    anytime moving   Pelvic adhesions    Recurrent cystitis     Past Surgical History:  Procedure Laterality Date   ABDOMINAL HYSTERECTOMY  2000   due to pre cancerous cells   BASAL CELL CARCINOMA EXCISION  2013   removed from forehead and Lt shooulder   BREAST CYST ASPIRATION  01/2010   CHOLECYSTECTOMY  06/19/2012   COLONOSCOPY WITH PROPOFOL N/A 07/06/2015   Procedure: COLONOSCOPY WITH PROPOFOL;  Surgeon: [name], MD;  Location: [facility];  Service: Endoscopy;  Laterality: N/A;   EXPLORATORY LAPAROTOMY     lysis of adhesions   LIPOSUCTION     MELANOMA EXCISION  06/2012   Removed from Rt shoulder   OVARY SURGERY  1997   hx of removal of ovary and adhesions   PARATHYROIDECTOMY  04/14/2017   ROTATOR CUFF REPAIR        SOCIAL HISTORY:  Social History   Tobacco Use  Smoking Status Never   Passive exposure: Never  Smokeless Tobacco Never   Social History   Substance and Sexual Activity  Alcohol Use No   Alcohol/week: 0.0 standard drinks of alcohol    Social History   Substance and Sexual Activity  Drug Use No    Family History  Problem Relation Age of Onset   Hypertension Mother    Lymphoma Mother    Hypertension Father    Heart attack Maternal Grandfather 38       MI   Cancer Neg Hx    Diabetes Neg Hx    Heart disease Neg Hx    Kidney cancer Neg Hx    Bladder Cancer Neg Hx     ALLERGIES:  Sulfa antibiotics, Tape, and Penicillins  MEDS:   Current Outpatient Medications on File Prior to Visit  Medication Sig Dispense Refill   estradiol (ESTRACE) 1 MG tablet Take 1 tablet (1 mg total) by mouth daily. 90 tablet 3   hydrochlorothiazide (HYDRODIURIL) 25 MG tablet TAKE 1 TABLET BY MOUTH EVERY DAY  90 tablet 3   losartan (COZAAR) 100 MG tablet TAKE 1/2 TABLET BY MOUTH EVERY DAY 45 tablet 1   zolpidem (AMBIEN) 10 MG tablet TAKE 1 TABLET (10 MG TOTAL) BY MOUTH AT BEDTIME AS NEEDED. FOR SLEEP 30 tablet 4   No current facility-administered medications on file prior to visit.    No orders of the defined types were placed in this encounter.    Physical examination BP (!) 170/110   Pulse 97   Ht '5\' 4"'$  (1.626 m)   Wt 128 lb 6.4 oz (58.2 kg)   BMI 22.04 kg/m   General NAD, Conversant  HEENT Atraumatic; Op clear with mmm.  Normo-cephalic. Pupils reactive. Anicteric sclerae  Thyroid/Neck Smooth without nodularity or enlargement. Normal ROM.  Neck Supple.  Skin No rashes, lesions or ulceration. Normal palpated skin turgor. No nodularity.  Breasts: No masses or discharge.  Symmetric.  No axillary adenopathy.  Lungs: Clear to auscultation.No rales or wheezes. Normal Respiratory effort, no retractions.  Heart: NSR.  No murmurs or rubs appreciated. No periferal edema  Abdomen: Soft.  Non-tender.  No masses.  No HSM. No hernia  Extremities: Moves all appropriately.  Normal ROM for age. No lymphadenopathy.  Neuro: Oriented to PPT.  Normal mood. Normal affect.     Pelvic:   Vulva: Normal appearance.  No lesions.  Vagina: No lesions or abnormalities noted.  Support: Second-degree cystocele  Urethra No masses tenderness or scarring.  Meatus Normal size without lesions or prolapse.  Cervix: Surgically absent  Anus: Normal exam.  No lesions.  Perineum: Normal exam.  No lesions.        Bimanual   Uterus: Surgically absent  Adnexae: No masses.  Non-tender to palpation.  Cul-de-sac: Negative for abnormality.   Assessment:   [id number] Patient Active Problem List   Diagnosis Date Noted   Incomplete tear of rotator cuff 09/11/2019   Shoulder pain 09/11/2019   Stiffness of shoulder joint 09/11/2019   Strain of rotator cuff capsule 09/11/2019   Disorder of bursae of shoulder region  09/11/2019   Status post lumbar discectomy 05/07/2019   Hand weakness 06/26/2018   Numbness and tingling 05/30/2018   Bilateral hand pain 04/03/2018   Diplopia 04/03/2018   Numbness and tingling in both hands 04/03/2018   Special screening for malignant neoplasms, colon    Perimenopausal vasomotor symptoms 06/10/2015   Status post hysterectomy 06/10/2015   History of cervical dysplasia 06/10/2015   History of oophorectomy, unilateral 06/10/2015   Cholelithiasis 03/31/2015   Melanoma ([place]) 03/31/2015   Hx of abnormal cervical Pap smear 03/31/2015   Tinea versicolor 03/31/2015   Vitamin D deficiency 03/31/2015   Hyperlipemia 03/31/2015   Depression 03/31/2015  Acute anxiety 03/31/2015   Insomnia 03/31/2015   Hypertension 03/31/2015   Plantar fasciitis 03/31/2015   Frequent UTI 03/31/2015   Discoid eczema 03/31/2015    1. Cystocele with rectocele   2. SUI (stress urinary incontinence, female)   3. Preop examination    Patient strongly advised to seek follow-up at her family 71 office for hypertension.  Have recommended she do this this week.  Plan:   Orders: No orders of the defined types were placed in this encounter.    1.  Anterior repair with TOT.  Possible posterior repair.  Pre-op discussions regarding Risks and Benefits of her scheduled surgery.  Anterior Repair I have discussed the procedure of anterior repair and Kelly placation.  I have informed the patient that this procedure often corrects or improves stress urinary incontinence, but that there is certainly no guarantee of her improvement.  The procedure itself was discussed.  The possible damage to the bowel, ureters or urethra was also discussed.  We have reviewed the repositioning of the bladder that often takes place at anterior repair and I have informed her that although unlikely, it is possible that a worsening of her incontinence could occur after this procedure.  I have also discussed with her  the necessity of decreased lifting and physical activity following the procedure as well as the possibility that as she gets older, her stress urinary incontinence could slowly return.  The use of vaginal Estrogen or oral Estrogen as well as other medications in the role of both stress and bladder dysynergia incontinence were discussed.  I have discussed the complication of inability to void immediately following the procedure.  The patient is aware that she may go home using a Foley catheter or may be taught the technique of self-catheterization should a complication develop.  All of her questions have been answered and I believe that she has an informed understanding of anterior repair/Kelly plication. TOT I have discussed the procedure of tension free vaginal tape using the trans-obturator approach. (TOT).  I have informed the patient that this procedure often corrects or improves stress urinary incontinence.  For patients without urinary incontinence-this procedure is often performed to lower the risk of iatragenic urinary incontinence at the time of cystocele repair.The patient has been made aware that there is no guarantee of her improvement or the length of time her improvement will last.  The procedure itself was discussed in detail including possible damage to bowel, ureters, urethra and bladder.  I have informed her that the mesh is permanent.  The risk of extrusion of the mesh has also been reviewed.  The management of this complication has been discussed.  The risks of bleeding and infection were also reviewed.  I have specifically discussed the complication of inability to void following the procedure and the patient is aware that she may go home using a Foley catheter or using a self-catheterization technique.  In addition, I have discussed with the patient the use of cystoscopy to diagnose bladder injury should there be any question of this.    Regarding the polypropylene mesh and mid-urethral  slings: I have review the current position statements from AUGS 2014 and the AUA.  I have given her copies of both to review.  All of her questions were answered.  She has been advised that should she have any additional questions or concerns regarding her sling procedure we would be happy to schedule a time before her surgery to discuss them. All of the patient's  questions have been answered and I believe she has an informed understanding of TOT and cystoscopy.  Posterior Repair Posterior repair was discussed with the patient.  The risks were reviewed and include:  possible damage to rectum and bowel, bleeding, infection and anesthesia.  The benefits were also discussed.  Post-op recovery with special attention to hospital stay and return to sexual function were specifically reviewed.  All her questions were answered, and I believe that she has an informed understanding of Posterior repair.    I spent 33 minutes involved in the care of this patient preparing to see the patient by obtaining and reviewing her medical history (including labs, imaging tests and prior procedures), documenting clinical information in the electronic health record (EHR), counseling and coordinating care plans, writing and sending prescriptions, ordering tests or procedures and in direct communicating with the patient and medical staff discussing pertinent items from her history and physical exam.  Finis Bud, M.D. 09/21/2022 3:47 PM

## 2022-09-21 NOTE — H&P (View-Only) (Signed)
PRE-OPERATIVE HISTORY AND PHYSICAL EXAM  PCP:  [name]., MD Subjective:   HPI:  Jill Porter is a 56 y.o. [phone].  No LMP recorded. Patient has had a hysterectomy.  She presents today for a pre-op discussion and PE.  She has the following symptoms: Stress urinary incontinence.  Leakage with coughing laughing sneezing.  She feels as if this has become a significant issue for her and she would like it fixed.  Of significant note she has had a prior hysterectomy.  Review of Systems:   Constitutional: Denied constitutional symptoms, night sweats, recent illness, fatigue, fever, insomnia and weight loss.  Eyes: Denied eye symptoms, eye pain, photophobia, vision change and visual disturbance.  Ears/Nose/Throat/Neck: Denied ear, nose, throat or neck symptoms, hearing loss, nasal discharge, sinus congestion and sore throat.  Cardiovascular: Denied cardiovascular symptoms, arrhythmia, chest pain/pressure, edema, exercise intolerance, orthopnea and palpitations.  Respiratory: Denied pulmonary symptoms, asthma, pleuritic pain, productive sputum, cough, dyspnea and wheezing.  Gastrointestinal: Denied, gastro-esophageal reflux, melena, nausea and vomiting.  Genitourinary: See HPI for additional information.  Musculoskeletal: Denied musculoskeletal symptoms, stiffness, swelling, muscle weakness and myalgia.  Dermatologic: Denied dermatology symptoms, rash and scar.  Neurologic: Denied neurology symptoms, dizziness, headache, neck pain and syncope.  Psychiatric: Denied psychiatric symptoms, anxiety and depression.  Endocrine: Denied endocrine symptoms including hot flashes and night sweats.   OB History  Gravida Para Term Preterm AB Living  '4 4 4     4  '$ SAB IAB Ectopic Multiple Live Births          4    # Outcome Date GA Lbr Len/2nd Weight Sex Delivery Anes PTL Lv  4 Term 1993   8 lb 6.4 oz (3.81 kg) M Vag-Spont   LIV  3 Term 1990   7 lb 6.4 oz (3.357 kg) F Vag-Spont   LIV   2 Term 1987   6 lb 9.6 oz (2.994 kg) M Vag-Spont   LIV  1 Term 1984   6 lb 9.6 oz (2.994 kg) M Vag-Spont   LIV    Past Medical History:  Diagnosis Date   Cervical dysphagia    Climacteric    Hypertension    Melanoma ([place])    Motion sickness    anytime moving   Pelvic adhesions    Recurrent cystitis     Past Surgical History:  Procedure Laterality Date   ABDOMINAL HYSTERECTOMY  2000   due to pre cancerous cells   BASAL CELL CARCINOMA EXCISION  2013   removed from forehead and Lt shooulder   BREAST CYST ASPIRATION  01/2010   CHOLECYSTECTOMY  06/19/2012   COLONOSCOPY WITH PROPOFOL N/A 07/06/2015   Procedure: COLONOSCOPY WITH PROPOFOL;  Surgeon: [name], MD;  Location: [facility];  Service: Endoscopy;  Laterality: N/A;   EXPLORATORY LAPAROTOMY     lysis of adhesions   LIPOSUCTION     MELANOMA EXCISION  06/2012   Removed from Rt shoulder   OVARY SURGERY  1997   hx of removal of ovary and adhesions   PARATHYROIDECTOMY  04/14/2017   ROTATOR CUFF REPAIR        SOCIAL HISTORY:  Social History   Tobacco Use  Smoking Status Never   Passive exposure: Never  Smokeless Tobacco Never   Social History   Substance and Sexual Activity  Alcohol Use No   Alcohol/week: 0.0 standard drinks of alcohol    Social History   Substance and Sexual Activity  Drug Use No    Family History  Problem Relation Age of Onset   Hypertension Mother    Lymphoma Mother    Hypertension Father    Heart attack Maternal Grandfather 18       MI   Cancer Neg Hx    Diabetes Neg Hx    Heart disease Neg Hx    Kidney cancer Neg Hx    Bladder Cancer Neg Hx     ALLERGIES:  Sulfa antibiotics, Tape, and Penicillins  MEDS:   Current Outpatient Medications on File Prior to Visit  Medication Sig Dispense Refill   estradiol (ESTRACE) 1 MG tablet Take 1 tablet (1 mg total) by mouth daily. 90 tablet 3   hydrochlorothiazide (HYDRODIURIL) 25 MG tablet TAKE 1 TABLET BY MOUTH EVERY DAY  90 tablet 3   losartan (COZAAR) 100 MG tablet TAKE 1/2 TABLET BY MOUTH EVERY DAY 45 tablet 1   zolpidem (AMBIEN) 10 MG tablet TAKE 1 TABLET (10 MG TOTAL) BY MOUTH AT BEDTIME AS NEEDED. FOR SLEEP 30 tablet 4   No current facility-administered medications on file prior to visit.    No orders of the defined types were placed in this encounter.    Physical examination BP (!) 170/110   Pulse 97   Ht '5\' 4"'$  (1.626 m)   Wt 128 lb 6.4 oz (58.2 kg)   BMI 22.04 kg/m   General NAD, Conversant  HEENT Atraumatic; Op clear with mmm.  Normo-cephalic. Pupils reactive. Anicteric sclerae  Thyroid/Neck Smooth without nodularity or enlargement. Normal ROM.  Neck Supple.  Skin No rashes, lesions or ulceration. Normal palpated skin turgor. No nodularity.  Breasts: No masses or discharge.  Symmetric.  No axillary adenopathy.  Lungs: Clear to auscultation.No rales or wheezes. Normal Respiratory effort, no retractions.  Heart: NSR.  No murmurs or rubs appreciated. No periferal edema  Abdomen: Soft.  Non-tender.  No masses.  No HSM. No hernia  Extremities: Moves all appropriately.  Normal ROM for age. No lymphadenopathy.  Neuro: Oriented to PPT.  Normal mood. Normal affect.     Pelvic:   Vulva: Normal appearance.  No lesions.  Vagina: No lesions or abnormalities noted.  Support: Second-degree cystocele  Urethra No masses tenderness or scarring.  Meatus Normal size without lesions or prolapse.  Cervix: Surgically absent  Anus: Normal exam.  No lesions.  Perineum: Normal exam.  No lesions.        Bimanual   Uterus: Surgically absent  Adnexae: No masses.  Non-tender to palpation.  Cul-de-sac: Negative for abnormality.   Assessment:   [id number] Patient Active Problem List   Diagnosis Date Noted   Incomplete tear of rotator cuff 09/11/2019   Shoulder pain 09/11/2019   Stiffness of shoulder joint 09/11/2019   Strain of rotator cuff capsule 09/11/2019   Disorder of bursae of shoulder region  09/11/2019   Status post lumbar discectomy 05/07/2019   Hand weakness 06/26/2018   Numbness and tingling 05/30/2018   Bilateral hand pain 04/03/2018   Diplopia 04/03/2018   Numbness and tingling in both hands 04/03/2018   Special screening for malignant neoplasms, colon    Perimenopausal vasomotor symptoms 06/10/2015   Status post hysterectomy 06/10/2015   History of cervical dysplasia 06/10/2015   History of oophorectomy, unilateral 06/10/2015   Cholelithiasis 03/31/2015   Melanoma ([place]) 03/31/2015   Hx of abnormal cervical Pap smear 03/31/2015   Tinea versicolor 03/31/2015   Vitamin D deficiency 03/31/2015   Hyperlipemia 03/31/2015   Depression 03/31/2015  Acute anxiety 03/31/2015   Insomnia 03/31/2015   Hypertension 03/31/2015   Plantar fasciitis 03/31/2015   Frequent UTI 03/31/2015   Discoid eczema 03/31/2015    1. Cystocele with rectocele   2. SUI (stress urinary incontinence, female)   3. Preop examination      Plan:   Orders: No orders of the defined types were placed in this encounter.    1.  Anterior repair with TOT.  Possible posterior repair.   medical staff discussing pertinent items from her history and physical exam.  Finis Bud, M.D. 09/21/2022 3:47 PM

## 2022-10-10 ENCOUNTER — Encounter
Admission: RE | Admit: 2022-10-10 | Discharge: 2022-10-10 | Disposition: A | Discharge status: Home / Self Care | Payer: Commercial Managed Care - PPO | Source: Ambulatory Visit | Attending: Obstetrics and Gynecology | Admitting: Obstetrics and Gynecology

## 2022-10-10 ENCOUNTER — Ambulatory Visit (INDEPENDENT_AMBULATORY_CARE_PROVIDER_SITE_OTHER): Payer: Commercial Managed Care - PPO | Admitting: Family Medicine

## 2022-10-10 ENCOUNTER — Encounter: Payer: Self-pay | Admitting: Family Medicine

## 2022-10-10 VITALS — BP 170/98 | HR 89 | Ht 64.0 in | Wt 127.8 lb

## 2022-10-10 DIAGNOSIS — R1011 Right upper quadrant pain: Secondary | ICD-10-CM | POA: Diagnosis not present

## 2022-10-10 DIAGNOSIS — Z79899 Other long term (current) drug therapy: Secondary | ICD-10-CM

## 2022-10-10 DIAGNOSIS — I10 Essential (primary) hypertension: Secondary | ICD-10-CM

## 2022-10-10 DIAGNOSIS — Z01812 Encounter for preprocedural laboratory examination: Secondary | ICD-10-CM

## 2022-10-10 HISTORY — DX: Gastro-esophageal reflux disease without esophagitis: K21.9

## 2022-10-10 NOTE — Patient Instructions (Signed)
Your procedure is scheduled on:10-17-22 Monday Report to the Registration Desk on the 1st floor of the [facility].Then proceed to the 2nd floor Surgery Desk To find out your arrival time, please call [phone] between 1PM - 3PM on:10-14-22 Friday If your arrival time is 6:00 am, do not arrive prior to that time as the [facility] entrance doors do not open until 6:00 am.  REMEMBER: Instructions that are not followed completely may result in serious medical risk, up to and including death; or upon the discretion of your surgeon and anesthesiologist your surgery may need to be rescheduled.  Do not eat food OR drink any liquids after midnight the night before surgery.  No gum chewing, lozengers or hard candies.  Do NOT take any medication the day of surgery  One week prior to surgery: Stop Anti-inflammatories (NSAIDS) such as Advil, Aleve, Ibuprofen, Motrin, Naproxen, Naprosyn and Aspirin based products such as Excedrin, Goodys Powder, BC Powder.You may however, take Tylenol if needed for pain up until the day of surgery.  Stop ANY OVER THE COUNTER supplements/vitamins NOW (10-10-22) until after surgery.  No Alcohol for 24 hours before or after surgery.  No Smoking including e-cigarettes for 24 hours prior to surgery.  No chewable tobacco products for at least 6 hours prior to surgery.  No nicotine patches on the day of surgery.  Do not use any "recreational" drugs for at least a week prior to your surgery.  Please be advised that the combination of cocaine and anesthesia may have negative outcomes, up to and including death. If you test positive for cocaine, your surgery will be cancelled.  On the morning of surgery brush your teeth with toothpaste and water, you may rinse your mouth with mouthwash if you wish. Do not swallow any toothpaste or mouthwash.  Do not wear jewelry, make-up, hairpins, clips or nail polish.  Do not wear lotions, powders, or perfumes.   Do not shave body  from the neck down 48 hours prior to surgery just in case you cut yourself which could leave a site for infection.  Also, freshly shaved skin may become irritated if using the CHG soap.  Contact lenses, hearing aids and dentures may not be worn into surgery.  Do not bring valuables to the hospital. [facility] is not responsible for any missing/lost belongings or valuables.   Notify your doctor if there is any change in your medical condition (cold, fever, infection).  Wear comfortable clothing (specific to your surgery type) to the hospital.  After surgery, you can help prevent lung complications by doing breathing exercises.  Take deep breaths and cough every 1-2 hours. Your doctor may order a device called an Incentive Spirometer to help you take deep breaths. When coughing or sneezing, hold a pillow firmly against your incision with both hands. This is called "splinting." Doing this helps protect your incision. It also decreases belly discomfort.  If you are being admitted to the hospital overnight, leave your suitcase in the car. After surgery it may be brought to your room.  If you are being discharged the day of surgery, you will not be allowed to drive home. You will need a responsible adult (18 years or older) to drive you home and stay with you that night.   If you are taking public transportation, you will need to have a responsible adult (18 years or older) with you. Please confirm with your physician that it is acceptable to use public transportation.   Please  call the [facility]. at [phone] if you have any questions about these instructions.  Surgery Visitation Policy:  Patients undergoing a surgery or procedure may have two family members or support persons with them as long as the person is not COVID-19 positive or experiencing its symptoms.   Due to an increase in RSV and influenza rates and associated hospitalizations, children ages 50 and under  will not be able to visit patients in [facility]. Masks continue to be strongly recommended.

## 2022-10-10 NOTE — Progress Notes (Signed)
I,Sha'taria Tyson,acting as a Education administrator for Lelon Huh, MD.,have documented all relevant documentation on the behalf of Lelon Huh, MD,as directed by  Lelon Huh, MD while in the presence of Lelon Huh, MD.   Established patient visit   Patient: Jill Porter   DOB: 09/26/1966   57 y.o. Female  MRN: [id number] Visit Date: 10/10/2022  Today's healthcare provider: Lelon Huh, MD   No chief complaint on file.  Subjective    HPI Patient is being seen today with concerns of bulging of RUQ. Patient reports it started over a year ago occurring intermittently. Starts to feel sore in RUQ then develops a hard bulge in the area. There are usually no precipitating factors, but goes down when she lays on her back and stops hurting after a few minutes. However, on the 29th of December she had a stomach virus/flu and right side knotted with more severe pain than usual. Hard bulge went down when she laid on her back but has been sore with stinging sensation since then. She is noted to have had laparoscopic cholecystectomy in 2018. She is scheduled for surgery a week from today cystocele repair and is concerned this may be a problem for planned procedure.    Medications: Outpatient Medications Prior to Visit  Medication Sig   estradiol (ESTRACE) 1 MG tablet Take 1 tablet (1 mg total) by mouth daily.   hydrochlorothiazide (HYDRODIURIL) 25 MG tablet TAKE 1 TABLET BY MOUTH EVERY DAY   losartan (COZAAR) 100 MG tablet TAKE 1/2 TABLET BY MOUTH EVERY DAY   metroNIDAZOLE (FLAGYL) 500 MG tablet Take 1 tablet (500 mg total) by mouth 2 (two) times daily. Begin 5 days prior to scheduled surgery as directed.   zolpidem (AMBIEN) 10 MG tablet TAKE 1 TABLET (10 MG TOTAL) BY MOUTH AT BEDTIME AS NEEDED. FOR SLEEP   No facility-administered medications prior to visit.    Review of Systems  Constitutional:  Negative for appetite change, chills, fatigue and fever.  Respiratory:  Negative for chest  tightness and shortness of breath.   Cardiovascular:  Negative for chest pain and palpitations.  Gastrointestinal:  Negative for abdominal pain, nausea and vomiting.  Neurological:  Negative for dizziness and weakness.       Objective    BP (!) 170/98 (BP Location: Right Arm, Patient Position: Sitting, Cuff Size: Normal)   Pulse 89   Ht '5\' 4"'$  (1.626 m)   Wt 127 lb 12.8 oz (58 kg)   SpO2 100%   BMI 21.94 kg/m    Physical Exam  General Appearance:    Well developed, well nourished female, alert, cooperative, in no acute distress  Eyes:    PERRL, conjunctiva/corneas clear, EOM's intact       Lungs:     Clear to auscultation bilaterally, respirations unlabored  Heart:    Normal heart rate. Normal rhythm. No murmurs, rubs, or gallops.    Abdomen:   bowel sounds present and normal in all 4 quadrants, soft, nondistended. Slightly tender RUQ. No defect of abdominal well. No swelling or masses.          Assessment & Plan     1. RUQ pain Intermittent for the last few area around old laparoscopic surgical scars.  Recent episode was precipitated by nausea and vomiting for gastroenteritis which is likely why it was more severe than previous episodes. There are no masses, bulges or abdominal wall defects on exam today and history is not consistent with hernia. Symptoms are  most consistent with RUQ abdominal wall muscle spasms. If symptoms worsen or become more persistent would consider RUQ imaging.      The entirety of the information documented in the History of Present Illness, Review of Systems and Physical Exam were personally obtained by me. Portions of this information were initially documented by the CMA and reviewed by me for thoroughness and accuracy.     Lelon Huh, MD  [facility] [phone] (phone) [phone] (fax)  [place]

## 2022-10-11 ENCOUNTER — Encounter
Admission: RE | Admit: 2022-10-11 | Discharge: 2022-10-11 | Disposition: A | Discharge status: Home / Self Care | Payer: Commercial Managed Care - PPO | Source: Ambulatory Visit | Attending: Obstetrics and Gynecology | Admitting: Obstetrics and Gynecology

## 2022-10-11 DIAGNOSIS — Z79899 Other long term (current) drug therapy: Secondary | ICD-10-CM | POA: Diagnosis not present

## 2022-10-11 DIAGNOSIS — I10 Essential (primary) hypertension: Secondary | ICD-10-CM | POA: Diagnosis not present

## 2022-10-11 DIAGNOSIS — Z01818 Encounter for other preprocedural examination: Secondary | ICD-10-CM | POA: Insufficient documentation

## 2022-10-11 DIAGNOSIS — Z01812 Encounter for preprocedural laboratory examination: Secondary | ICD-10-CM

## 2022-10-11 LAB — BASIC METABOLIC PANEL
Anion gap: 8 (ref 5–15)
BUN: 16 mg/dL (ref 6–20)
CO2: 27 mmol/L (ref 22–32)
Calcium: 8.6 mg/dL — ABNORMAL LOW (ref 8.9–10.3)
Chloride: 101 mmol/L (ref 98–111)
Creatinine, Ser: 0.96 mg/dL (ref 0.44–1.00)
GFR, Estimated: >60 mL/min (ref >60)
Glucose, Bld: 90 mg/dL (ref 70–99)
Potassium: 3.6 mmol/L (ref 3.5–5.1)
Sodium: 136 mmol/L (ref 135–145)

## 2022-10-14 ENCOUNTER — Ambulatory Visit: Payer: Commercial Managed Care - PPO | Admitting: Physician Assistant

## 2022-10-17 ENCOUNTER — Encounter: Payer: Self-pay | Admitting: Obstetrics and Gynecology

## 2022-10-17 ENCOUNTER — Encounter
Admission: RE | Disposition: A | Discharge status: Home / Self Care | Payer: Self-pay | Source: Ambulatory Visit | Attending: Obstetrics and Gynecology

## 2022-10-17 ENCOUNTER — Ambulatory Visit: Payer: Commercial Managed Care - PPO | Admitting: Urgent Care

## 2022-10-17 ENCOUNTER — Ambulatory Visit
Admission: RE | Admit: 2022-10-17 | Discharge: 2022-10-17 | Disposition: A | Discharge status: Home / Self Care | Payer: Commercial Managed Care - PPO | Source: Ambulatory Visit | Attending: Obstetrics and Gynecology | Admitting: Obstetrics and Gynecology

## 2022-10-17 ENCOUNTER — Other Ambulatory Visit: Payer: Self-pay

## 2022-10-17 DIAGNOSIS — Z419 Encounter for procedure for purposes other than remedying health state, unspecified: Secondary | ICD-10-CM

## 2022-10-17 DIAGNOSIS — N8110 Cystocele, unspecified: Secondary | ICD-10-CM | POA: Insufficient documentation

## 2022-10-17 DIAGNOSIS — Z79899 Other long term (current) drug therapy: Secondary | ICD-10-CM | POA: Insufficient documentation

## 2022-10-17 DIAGNOSIS — Z85820 Personal history of malignant melanoma of skin: Secondary | ICD-10-CM | POA: Diagnosis not present

## 2022-10-17 DIAGNOSIS — N393 Stress incontinence (female) (male): Secondary | ICD-10-CM | POA: Insufficient documentation

## 2022-10-17 DIAGNOSIS — N816 Rectocele: Secondary | ICD-10-CM | POA: Insufficient documentation

## 2022-10-17 DIAGNOSIS — Z01818 Encounter for other preprocedural examination: Secondary | ICD-10-CM

## 2022-10-17 DIAGNOSIS — K219 Gastro-esophageal reflux disease without esophagitis: Secondary | ICD-10-CM | POA: Insufficient documentation

## 2022-10-17 DIAGNOSIS — I10 Essential (primary) hypertension: Secondary | ICD-10-CM | POA: Insufficient documentation

## 2022-10-17 HISTORY — PX: PUBOVAGINAL SLING: SHX1035

## 2022-10-17 HISTORY — PX: CYSTOCELE REPAIR: SHX163

## 2022-10-17 LAB — TYPE AND SCREEN
ABO/RH(D): O POS
Antibody Screen: NEGATIVE

## 2022-10-17 LAB — ABO/RH: ABO/RH(D): O POS

## 2022-10-17 SURGERY — COLPORRHAPHY, ANTERIOR, FOR CYSTOCELE REPAIR
Anesthesia: General | Site: Vagina

## 2022-10-17 MED ORDER — ESTROGENS CONJUGATED 0.625 MG/GM VA CREA
TOPICAL_CREAM | VAGINAL | Status: AC
  Filled 2022-10-17: qty 30

## 2022-10-17 MED ORDER — SODIUM CHLORIDE (PF) 0.9 % IJ SOLN
INTRAMUSCULAR | Status: AC
  Filled 2022-10-17: qty 100

## 2022-10-17 MED ORDER — CHLORHEXIDINE GLUCONATE 0.12 % MT SOLN: 15.0000 mL | Freq: Once | OROMUCOSAL | Status: AC

## 2022-10-17 MED ORDER — PROPOFOL 10 MG/ML IV BOLUS
INTRAVENOUS | Status: DC | PRN
  Administered 2022-10-17: 150 mg via INTRAVENOUS

## 2022-10-17 MED ORDER — POVIDONE-IODINE 10 % EX SWAB: 2.0000 | Freq: Once | CUTANEOUS | Status: DC

## 2022-10-17 MED ORDER — PROPOFOL 10 MG/ML IV BOLUS
INTRAVENOUS | Status: AC
  Filled 2022-10-17: qty 20

## 2022-10-17 MED ORDER — MIDAZOLAM HCL 2 MG/2ML IJ SOLN
INTRAMUSCULAR | Status: AC
  Filled 2022-10-17: qty 2

## 2022-10-17 MED ORDER — EPHEDRINE SULFATE (PRESSORS) 50 MG/ML IJ SOLN
INTRAMUSCULAR | Status: DC | PRN
  Administered 2022-10-17: 10 mg via INTRAVENOUS
  Administered 2022-10-17: 5 mg via INTRAVENOUS

## 2022-10-17 MED ORDER — FAMOTIDINE 20 MG PO TABS
ORAL_TABLET | ORAL | Status: AC
  Administered 2022-10-17: 20 mg via ORAL
  Filled 2022-10-17: qty 1

## 2022-10-17 MED ORDER — KETOROLAC TROMETHAMINE 30 MG/ML IJ SOLN
INTRAMUSCULAR | Status: AC
  Filled 2022-10-17: qty 1

## 2022-10-17 MED ORDER — ACETAMINOPHEN 10 MG/ML IV SOLN
INTRAVENOUS | Status: DC | PRN
  Administered 2022-10-17: 1000 mg via INTRAVENOUS

## 2022-10-17 MED ORDER — IBUPROFEN 600 MG PO TABS: 600.0000 mg | ORAL_TABLET | Freq: Four times a day (QID) | ORAL | 3 refills | Status: DC | PRN

## 2022-10-17 MED ORDER — ONDANSETRON HCL 4 MG/2ML IJ SOLN
INTRAMUSCULAR | Status: DC | PRN
  Administered 2022-10-17: 4 mg via INTRAVENOUS

## 2022-10-17 MED ORDER — DEXAMETHASONE SODIUM PHOSPHATE 10 MG/ML IJ SOLN
INTRAMUSCULAR | Status: DC | PRN
  Administered 2022-10-17: 10 mg via INTRAVENOUS

## 2022-10-17 MED ORDER — FENTANYL CITRATE (PF) 100 MCG/2ML IJ SOLN
25.0000 ug | INTRAMUSCULAR | Status: DC | PRN
  Administered 2022-10-17: 50 ug via INTRAVENOUS

## 2022-10-17 MED ORDER — CEFAZOLIN SODIUM-DEXTROSE 2-4 GM/100ML-% IV SOLN
2.0000 g | INTRAVENOUS | Status: AC
  Administered 2022-10-17: 2 g via INTRAVENOUS

## 2022-10-17 MED ORDER — LIDOCAINE HCL (CARDIAC) PF 100 MG/5ML IV SOSY
PREFILLED_SYRINGE | INTRAVENOUS | Status: DC | PRN
  Administered 2022-10-17: 60 mg via INTRAVENOUS

## 2022-10-17 MED ORDER — CEFAZOLIN SODIUM-DEXTROSE 2-4 GM/100ML-% IV SOLN
INTRAVENOUS | Status: AC
  Filled 2022-10-17: qty 100

## 2022-10-17 MED ORDER — FENTANYL CITRATE (PF) 100 MCG/2ML IJ SOLN
INTRAMUSCULAR | Status: DC | PRN
  Administered 2022-10-17 (×2): 50 ug via INTRAVENOUS

## 2022-10-17 MED ORDER — ORAL CARE MOUTH RINSE: 15.0000 mL | Freq: Once | OROMUCOSAL | Status: AC

## 2022-10-17 MED ORDER — EPHEDRINE 5 MG/ML INJ
INTRAVENOUS | Status: AC
  Filled 2022-10-17: qty 5

## 2022-10-17 MED ORDER — OXYCODONE HCL 5 MG PO TABS
ORAL_TABLET | ORAL | Status: AC
  Filled 2022-10-17: qty 1

## 2022-10-17 MED ORDER — VASOPRESSIN 20 UNIT/ML IV SOLN
INTRAVENOUS | Status: DC | PRN
  Administered 2022-10-17: 20 mL

## 2022-10-17 MED ORDER — FENTANYL CITRATE (PF) 100 MCG/2ML IJ SOLN
INTRAMUSCULAR | Status: AC
  Administered 2022-10-17: 50 ug via INTRAVENOUS
  Filled 2022-10-17: qty 2

## 2022-10-17 MED ORDER — VASOPRESSIN 20 UNIT/ML IV SOLN
INTRAVENOUS | Status: AC
  Filled 2022-10-17: qty 1

## 2022-10-17 MED ORDER — MIDAZOLAM HCL 2 MG/2ML IJ SOLN
INTRAMUSCULAR | Status: DC | PRN
  Administered 2022-10-17: 2 mg via INTRAVENOUS

## 2022-10-17 MED ORDER — ONDANSETRON HCL 4 MG/2ML IJ SOLN: 4.0000 mg | Freq: Once | INTRAMUSCULAR | Status: DC | PRN

## 2022-10-17 MED ORDER — LACTATED RINGERS IV SOLN: INTRAVENOUS | Status: DC

## 2022-10-17 MED ORDER — BUPIVACAINE HCL (PF) 0.5 % IJ SOLN
INTRAMUSCULAR | Status: AC
  Filled 2022-10-17: qty 30

## 2022-10-17 MED ORDER — HYDROCODONE-ACETAMINOPHEN 5-325 MG PO TABS: 1.0000 | ORAL_TABLET | Freq: Four times a day (QID) | ORAL | 0 refills | Status: DC | PRN

## 2022-10-17 MED ORDER — DEXAMETHASONE SODIUM PHOSPHATE 10 MG/ML IJ SOLN
INTRAMUSCULAR | Status: AC
  Filled 2022-10-17: qty 1

## 2022-10-17 MED ORDER — CHLORHEXIDINE GLUCONATE 0.12 % MT SOLN
OROMUCOSAL | Status: AC
  Administered 2022-10-17: 15 mL via OROMUCOSAL
  Filled 2022-10-17: qty 15

## 2022-10-17 MED ORDER — KETOROLAC TROMETHAMINE 30 MG/ML IJ SOLN: 30.0000 mg | Freq: Once | INTRAMUSCULAR | Status: DC

## 2022-10-17 MED ORDER — ACETAMINOPHEN 10 MG/ML IV SOLN: 1000.0000 mg | Freq: Once | INTRAVENOUS | Status: DC | PRN

## 2022-10-17 MED ORDER — LIDOCAINE HCL (PF) 2 % IJ SOLN
INTRAMUSCULAR | Status: AC
  Filled 2022-10-17: qty 5

## 2022-10-17 MED ORDER — ONDANSETRON HCL 4 MG/2ML IJ SOLN
INTRAMUSCULAR | Status: AC
  Filled 2022-10-17: qty 2

## 2022-10-17 MED ORDER — FENTANYL CITRATE (PF) 100 MCG/2ML IJ SOLN
INTRAMUSCULAR | Status: AC
  Filled 2022-10-17: qty 2

## 2022-10-17 MED ORDER — PHENYLEPHRINE 80 MCG/ML (10ML) SYRINGE FOR IV PUSH (FOR BLOOD PRESSURE SUPPORT)
PREFILLED_SYRINGE | INTRAVENOUS | Status: AC
  Filled 2022-10-17: qty 10

## 2022-10-17 MED ORDER — ACETAMINOPHEN 10 MG/ML IV SOLN
INTRAVENOUS | Status: AC
  Filled 2022-10-17: qty 100

## 2022-10-17 MED ORDER — PHENYLEPHRINE HCL (PRESSORS) 10 MG/ML IV SOLN
INTRAVENOUS | Status: DC | PRN
  Administered 2022-10-17 (×2): 80 ug via INTRAVENOUS
  Administered 2022-10-17 (×3): 120 ug via INTRAVENOUS
  Administered 2022-10-17: 80 ug via INTRAVENOUS

## 2022-10-17 MED ORDER — OXYCODONE HCL 5 MG/5ML PO SOLN: 5.0000 mg | Freq: Once | ORAL | Status: AC | PRN

## 2022-10-17 MED ORDER — FAMOTIDINE 20 MG PO TABS: 20.0000 mg | ORAL_TABLET | Freq: Once | ORAL | Status: AC

## 2022-10-17 MED ORDER — KETOROLAC TROMETHAMINE 30 MG/ML IJ SOLN
INTRAMUSCULAR | Status: DC | PRN
  Administered 2022-10-17: 30 mg via INTRAVENOUS

## 2022-10-17 MED ORDER — OXYCODONE HCL 5 MG PO TABS
5.0000 mg | ORAL_TABLET | Freq: Once | ORAL | Status: AC | PRN
  Administered 2022-10-17: 5 mg via ORAL

## 2022-10-17 SURGICAL SUPPLY — 29 items
BAG DRN 6X6 URO DRBLE ADPR (MISCELLANEOUS)
BAG URINE DRAIN UROCATCH STRL (MISCELLANEOUS) ×1 IMPLANT
BLADE SURG SZ10 CARB STEEL (BLADE) ×2 IMPLANT
CATH FOLEY 2WAY SIL 16X30 (CATHETERS) ×2 IMPLANT
DRAPE PERI LITHO V/GYN (MISCELLANEOUS) ×2 IMPLANT
DRAPE UTILITY 15X26 TOWEL STRL (DRAPES) ×2 IMPLANT
ELECT REM PT RETURN 9FT ADLT (ELECTROSURGICAL) ×2
ELECTRODE REM PT RTRN 9FT ADLT (ELECTROSURGICAL) ×2 IMPLANT
GAUZE 4X4 16PLY ~~LOC~~+RFID DBL (SPONGE) ×5 IMPLANT
GLOVE PI ORTHO PRO STRL 7.5 (GLOVE) ×4 IMPLANT
GOWN STRL REUS W/ TWL LRG LVL3 (GOWN DISPOSABLE) ×4 IMPLANT
GOWN STRL REUS W/ TWL XL LVL3 (GOWN DISPOSABLE) ×2 IMPLANT
GOWN STRL REUS W/TWL LRG LVL3 (GOWN DISPOSABLE) ×4
GOWN STRL REUS W/TWL XL LVL3 (GOWN DISPOSABLE) ×2
MANIFOLD NEPTUNE II (INSTRUMENTS) ×2 IMPLANT
NEEDLE HYPO 22GX1.5 SAFETY (NEEDLE) ×2 IMPLANT
PACK BASIN MINOR ARMC (MISCELLANEOUS) ×2 IMPLANT
PAD OB MATERNITY 4.3X12.25 (PERSONAL CARE ITEMS) ×2 IMPLANT
SCRUB CHG 4% DYNA-HEX 4OZ (MISCELLANEOUS) ×2 IMPLANT
SLING TRANSOBTURATOR OBTRYX (Sling) ×1 IMPLANT
STRIP CLOSURE SKIN 1/2X4 (GAUZE/BANDAGES/DRESSINGS) ×2 IMPLANT
SURGILUBE 2OZ TUBE FLIPTOP (MISCELLANEOUS) ×2 IMPLANT
SUT VIC AB 0 CT1 27 (SUTURE) ×2
SUT VIC AB 0 CT1 27XCR 8 STRN (SUTURE) ×3 IMPLANT
SUT VICRYL 0 UR6 27IN ABS (SUTURE) ×4 IMPLANT
SYR 10ML LL (SYRINGE) ×4 IMPLANT
TOWEL OR 17X26 4PK STRL BLUE (TOWEL DISPOSABLE) ×2 IMPLANT
TRAP FLUID SMOKE EVACUATOR (MISCELLANEOUS) ×2 IMPLANT
WATER STERILE IRR 500ML POUR (IV SOLUTION) ×2 IMPLANT

## 2022-10-17 NOTE — Op Note (Addendum)
       OPERATIVE NOTE 10/17/2022 12:08 PM  PRE-OPERATIVE DIAGNOSIS:  1) Cystocele Stress Urinary Incontinence  POST-OPERATIVE DIAGNOSIS:  Same  OPERATION: Procedure(s) (LRB): ANTERIOR REPAIR WITH TOT (N/A) PUBO-VAGINAL SLING (N/A)  SURGEON(S): Surgeon(s) and Role:    Linzie Collin, MD - Primary   ANESTHESIA: Choice  ESTIMATED BLOOD LOSS: 50 mL  SPECIMEN: * No specimens in log *  COMPLICATIONS: None  DRAINS: none  DISPOSITION: Stable to recovery room  PROCEDURE: The vaginal mucosa beginning at the vaginal cuff and overlying the bladder, was grasped with Allis clamps and injected with a dilute Pitressin solution in the midline. A midline incision was made to the level of the urethra. The vaginal mucosa was dissected laterally from the underlying attenuated fascia. A Foley catheter was placed within the bladder and the bladder was emptied. Clear urine was noted. The obturator foramina were identified in the usual manner bilaterally and marked with a marking pen the skin and subcutaneous tissues were injected with a dilute Pitressin solution. Stab incisions were made and the TOT trochars were placed through these incisions onto the operator's finger in the vagina which was retracted and bladder medially. The vaginal tape was then placed on the trochars and reversed through these incisions. A Kelly clamp was placed under the tape and the sleeves of the tape were removed. The tape was noted to be correctly positioned underneath the urethra without twists. The excess tape was removed at the level of the skin. Steri-Strips were applied over these small skin incisions. A typical Kelly plication was performed carefully covering the tension-free vaginal tape with thickened fascia. The bladder was plicated several sutures of 3-0 Vicryl.  A shelf of fascia was then approximated in the midline placing the bladder back in its more anatomic position.The excess vaginal mucosa was trimmed.  Vaginal mucosa was then closed in the midline with interrupted sutures to the level of the vaginal cuff. The vaginal cuff was closed with Vicryl suture. Hemostasis was noted.  Elonda Husky, M.D. 10/17/2022 12:08 PM

## 2022-10-17 NOTE — Transfer of Care (Signed)
Immediate Anesthesia Transfer of Care Note  Patient: Jill Porter  Procedure(s) Performed: ANTERIOR REPAIR WITH TOT PUBO-VAGINAL SLING (Vagina )  Patient Location: PACU  Anesthesia Type:General  Level of Consciousness: awake, alert , and oriented  Airway & Oxygen Therapy: Patient Spontanous Breathing and Patient connected to nasal cannula oxygen  Post-op Assessment: Report given to RN and Post -op Vital signs reviewed and stable  Post vital signs: Reviewed and stable  Last Vitals:  Vitals Value Taken Time  BP 136/79 10/17/22 1211  Temp 97.5   Pulse 83 10/17/22 1214  Resp 12 10/17/22 1214  SpO2 100 % 10/17/22 1214  Vitals shown include unvalidated device data.  Last Pain:  Vitals:   10/17/22 0910  TempSrc: Temporal  PainSc: 0-No pain         Complications: No notable events documented.

## 2022-10-17 NOTE — Final Progress Note (Signed)
Patient awake/alert x4. Ambulated to bathroom, voided 257m yellow urine, bladder scan after:  825m Patient c/o's "burning"  Dr. EvAmalia Haileyade aware.  Waiting on discharge order.  Patient aware of above,  verbalizes understanding.

## 2022-10-17 NOTE — Anesthesia Preprocedure Evaluation (Signed)
Anesthesia Evaluation  Patient identified by MRN, date of birth, ID band Patient awake    Reviewed: Allergy & Precautions, NPO status , Patient's Chart, lab work & pertinent test results  History of Anesthesia Complications Negative for: history of anesthetic complications  Airway Mallampati: I  TM Distance: >3 FB Neck ROM: Full    Dental no notable dental hx. (+) Teeth Intact   Pulmonary neg pulmonary ROS, neg sleep apnea, neg COPD, Patient abstained from smoking.Not current smoker   Pulmonary exam normal breath sounds clear to auscultation       Cardiovascular Exercise Tolerance: Good METShypertension, Pt. on medications (-) CAD and (-) Past MI (-) dysrhythmias  Rhythm:Regular Rate:Normal - Systolic murmurs    Neuro/Psych  PSYCHIATRIC DISORDERS Anxiety Depression    negative neurological ROS     GI/Hepatic ,GERD  Medicated and Controlled,,(+)     (-) substance abuse    Endo/Other  neg diabetes    Renal/GU negative Renal ROS     Musculoskeletal   Abdominal   Peds  Hematology   Anesthesia Other Findings Past Medical History: No date: Cervical dysphagia No date: Climacteric No date: GERD (gastroesophageal reflux disease)     Comment:  occ No date: Hypertension No date: Melanoma (HCC) No date: Motion sickness     Comment:  anytime moving No date: Pelvic adhesions No date: Recurrent cystitis  Reproductive/Obstetrics                             Anesthesia Physical Anesthesia Plan  ASA: 2  Anesthesia Plan: General   Post-op Pain Management: Toradol IV (intra-op)* and Ofirmev IV (intra-op)*   Induction: Intravenous  PONV Risk Score and Plan: 3 and Ondansetron, Dexamethasone and Midazolam  Airway Management Planned: LMA  Additional Equipment: None  Intra-op Plan:   Post-operative Plan: Extubation in OR  Informed Consent: I have reviewed the patients History and Physical,  chart, labs and discussed the procedure including the risks, benefits and alternatives for the proposed anesthesia with the patient or authorized representative who has indicated his/her understanding and acceptance.     Dental advisory given  Plan Discussed with: CRNA and Surgeon  Anesthesia Plan Comments: (Discussed risks of anesthesia with patient, including PONV, sore throat, lip/dental/eye damage. Rare risks discussed as well, such as cardiorespiratory and neurological sequelae, and allergic reactions. Discussed the role of CRNA in patient's perioperative care. Patient understands.)       Anesthesia Quick Evaluation

## 2022-10-17 NOTE — Interval H&P Note (Signed)
History and Physical Interval Note:  10/17/2022 10:36 AM  Jill Porter  has presented today for surgery, with the diagnosis of Cystocele Stress Urinary Incontinence.  The various methods of treatment have been discussed with the patient and family. After consideration of risks, benefits and other options for treatment, the patient has consented to  Procedure(s): ANTERIOR REPAIR WITH TOT, POSSIBLE POSTERIOR REPAIR (N/A) as a surgical intervention.  The patient's history has been reviewed, patient examined, no change in status, stable for surgery.  I have reviewed the patient's chart and labs.  Questions were answered to the patient's satisfaction.     Jeannie Fend

## 2022-10-17 NOTE — Anesthesia Procedure Notes (Signed)
Procedure Name: LMA Insertion Date/Time: 10/17/2022 10:45 AM  Performed by: Jaeceon Michelin, [place], CRNAPre-anesthesia Checklist: Patient identified, Patient being monitored, Timeout performed, Emergency Drugs available and Suction available Patient Re-evaluated:Patient Re-evaluated prior to induction Oxygen Delivery Method: Circle system utilized Preoxygenation: Pre-oxygenation with 100% oxygen Induction Type: IV induction Ventilation: Mask ventilation without difficulty LMA: LMA inserted LMA Size: 4.0 Tube type: Oral Number of attempts: 1 Placement Confirmation: positive ETCO2 and breath sounds checked- equal and bilateral Tube secured with: Tape Dental Injury: Teeth and Oropharynx as per pre-operative assessment

## 2022-10-17 NOTE — Anesthesia Postprocedure Evaluation (Signed)
Anesthesia Post Note  Patient: Jill Porter  Procedure(s) Performed: ANTERIOR REPAIR (Vagina ) PUBO-VAGINAL SLING TOT (Vagina )  Patient location during evaluation: PACU Anesthesia Type: General Level of consciousness: awake and alert Pain management: pain level controlled Vital Signs Assessment: post-procedure vital signs reviewed and stable Respiratory status: spontaneous breathing, nonlabored ventilation, respiratory function stable and patient connected to nasal cannula oxygen Cardiovascular status: blood pressure returned to baseline and stable Postop Assessment: no apparent nausea or vomiting Anesthetic complications: no   No notable events documented.   Last Vitals:  Vitals:   10/17/22 1230 10/17/22 1245  BP: 123/83 (!) 142/84  Pulse: 85 84  Resp: (!) 22 18  Temp:    SpO2: 100% 98%    Last Pain:  Vitals:   10/17/22 1245  TempSrc:   PainSc: 6                  Arita Miss

## 2022-10-17 NOTE — Discharge Instructions (Addendum)
AMBULATORY SURGERY  DISCHARGE INSTRUCTIONS   The drugs that you were given will stay in your system until tomorrow so for the next 24 hours you should not:  Drive an automobile Make any legal decisions Drink any alcoholic beverage   You may resume regular meals tomorrow.  Today it is better to start with liquids and gradually work up to solid foods.  You may eat anything you prefer, but it is better to start with liquids, then soup and crackers, and gradually work up to solid foods.   Please notify your doctor immediately if you have any unusual bleeding, trouble breathing, redness and pain at the surgery site, drainage, fever, or pain not relieved by medication.    Your post-operative visit with Dr.                                       is: Date:                        Time:    Please call to schedule your post-operative visit. Additional Instructions: Encourage to void every two hours, while awake, one time thru the night, if having pain, bladder feels full, go to nearest emergency room   Nothing in the vagina including tampons or intercourse until after your 5-6 week follow-up appointment   Do not lift more than 20 lbs   Increase activity slowly  May shower. Do not submerge in bathtub, hot tub or pool until incisions are healed.

## 2022-10-18 ENCOUNTER — Encounter: Payer: Self-pay | Admitting: Obstetrics and Gynecology

## 2022-10-25 ENCOUNTER — Encounter: Payer: Self-pay | Admitting: Obstetrics and Gynecology

## 2022-10-25 ENCOUNTER — Ambulatory Visit (INDEPENDENT_AMBULATORY_CARE_PROVIDER_SITE_OTHER): Payer: Commercial Managed Care - PPO | Admitting: Obstetrics and Gynecology

## 2022-10-25 VITALS — BP 143/94 | HR 102 | Ht 64.0 in | Wt 132.5 lb

## 2022-10-25 DIAGNOSIS — R102 Pelvic and perineal pain: Secondary | ICD-10-CM

## 2022-10-25 DIAGNOSIS — Z9889 Other specified postprocedural states: Secondary | ICD-10-CM

## 2022-10-25 NOTE — Addendum Note (Signed)
Addended by: Douglass Rivers R on: 10/25/2022 11:21 AM   Modules accepted: Orders

## 2022-10-25 NOTE — Progress Notes (Signed)
HPI:      [name] is a 57 y.o. [phone] who LMP was No LMP recorded. Patient has had a hysterectomy.  Subjective:   She presents today she presents today 1 week from anterior pair TOT.  She reports that she was currently doing well.  She initially had some issues with bowel movements but this has resolved.  She reports some pelvic pressure symptoms every day especially when she sits down but she does not believe it is a UTI.  She is not having any problems urinating and she is not leaking at this time.  She reports no actual pain just pressure.    Hx: The following portions of the patient's history were reviewed and updated as appropriate:             She  has a past medical history of Cervical dysphagia, Climacteric, GERD (gastroesophageal reflux disease), Hypertension, Melanoma ([place]), Motion sickness, Pelvic adhesions, and Recurrent cystitis. She does not have any pertinent problems on file. She  has a past surgical history that includes Cholecystectomy (06/19/2012); Melanoma excision (06/2012); Excision basal cell carcinoma (2013); Breast cyst aspiration (01/2010); Ovary surgery (1997); Abdominal hysterectomy (2000); Liposuction; Exploratory laparotomy; Rotator cuff repair (Left); Colonoscopy with propofol (N/A, 07/06/2015); Parathyroidectomy (04/14/2017); Cystocele repair (N/A, 10/17/2022); and Pubovaginal sling (N/A, 10/17/2022). Her family history includes Heart attack (age of onset: 37) in her maternal grandfather; Hypertension in her father and mother; Lymphoma in her mother. She  reports that she has never smoked. She has never been exposed to tobacco smoke. She has never used smokeless tobacco. She reports that she does not drink alcohol and does not use drugs. She has a current medication list which includes the following prescription(s): calcium carbonate, estradiol, hydrochlorothiazide, ibuprofen, losartan, and zolpidem. She is allergic to sulfa antibiotics, penicillins, and  tape.       Review of Systems:  Review of Systems  Constitutional: Denied constitutional symptoms, night sweats, recent illness, fatigue, fever, insomnia and weight loss.  Eyes: Denied eye symptoms, eye pain, photophobia, vision change and visual disturbance.  Ears/Nose/Throat/Neck: Denied ear, nose, throat or neck symptoms, hearing loss, nasal discharge, sinus congestion and sore throat.  Cardiovascular: Denied cardiovascular symptoms, arrhythmia, chest pain/pressure, edema, exercise intolerance, orthopnea and palpitations.  Respiratory: Denied pulmonary symptoms, asthma, pleuritic pain, productive sputum, cough, dyspnea and wheezing.  Gastrointestinal: Denied, gastro-esophageal reflux, melena, nausea and vomiting.  Genitourinary: See HPI for additional information.  Musculoskeletal: Denied musculoskeletal symptoms, stiffness, swelling, muscle weakness and myalgia.  Dermatologic: Denied dermatology symptoms, rash and scar.  Neurologic: Denied neurology symptoms, dizziness, headache, neck pain and syncope.  Psychiatric: Denied psychiatric symptoms, anxiety and depression.  Endocrine: Denied endocrine symptoms including hot flashes and night sweats.   Meds:   Current Outpatient Medications on File Prior to Visit  Medication Sig Dispense Refill   calcium carbonate (TUMS - DOSED IN MG ELEMENTAL CALCIUM) 500 MG chewable tablet Chew 1 tablet by mouth as needed for indigestion or heartburn.     estradiol (ESTRACE) 1 MG tablet Take 1 tablet (1 mg total) by mouth daily. 90 tablet 3   hydrochlorothiazide (HYDRODIURIL) 25 MG tablet TAKE 1 TABLET BY MOUTH EVERY DAY (Patient taking differently: Take 25 mg by mouth every morning.) 90 tablet 3   ibuprofen (ADVIL) 600 MG tablet Take 1 tablet (600 mg total) by mouth every 6 (six) hours as needed. 60 tablet 3   losartan (COZAAR) 100 MG tablet TAKE 1/2 TABLET BY MOUTH EVERY DAY (Patient taking differently: Take  50 mg by mouth 2 (two) times daily.) 45 tablet  1   zolpidem (AMBIEN) 10 MG tablet TAKE 1 TABLET (10 MG TOTAL) BY MOUTH AT BEDTIME AS NEEDED. FOR SLEEP 30 tablet 4   No current facility-administered medications on file prior to visit.      Objective:     Vitals:   10/25/22 1049 10/25/22 1104  BP: (!) 155/94 (!) 143/94  Pulse: 99 (!) 102   Filed Weights   10/25/22 1049  Weight: 132 lb 8 oz (60.1 kg)                        Assessment:    [id number] Patient Active Problem List   Diagnosis Date Noted   Incomplete tear of rotator cuff 09/11/2019   Shoulder pain 09/11/2019   Stiffness of shoulder joint 09/11/2019   Strain of rotator cuff capsule 09/11/2019   Disorder of bursae of shoulder region 09/11/2019   Status post lumbar discectomy 05/07/2019   Hand weakness 06/26/2018   Numbness and tingling 05/30/2018   Bilateral hand pain 04/03/2018   Diplopia 04/03/2018   Numbness and tingling in both hands 04/03/2018   Special screening for malignant neoplasms, colon    Perimenopausal vasomotor symptoms 06/10/2015   Status post hysterectomy 06/10/2015   History of cervical dysplasia 06/10/2015   History of oophorectomy, unilateral 06/10/2015   Cholelithiasis 03/31/2015   Melanoma ([place]) 03/31/2015   Hx of abnormal cervical Pap smear 03/31/2015   Tinea versicolor 03/31/2015   Vitamin D deficiency 03/31/2015   Hyperlipemia 03/31/2015   Depression 03/31/2015   Acute anxiety 03/31/2015   Insomnia 03/31/2015   Hypertension 03/31/2015   Plantar fasciitis 03/31/2015   Frequent UTI 03/31/2015   Discoid eczema 03/31/2015     1. Postoperative state     Patient doing well postop.  Having some pelvic pressure symptoms when sitting but I think this is normal.  Urinating without difficulty and not having any leakage   Plan:            1.  Continue recovery.  2.  Urine sent for C&S just in case-make sure she does not have UTI.  Orders No orders of the defined types were placed in this encounter.   No orders of the  defined types were placed in this encounter.     F/U  Return in about 5 weeks (around 11/29/2022) for We will contact her with any abnormal test results.  Finis Bud, M.D. 10/25/2022 11:20 AM

## 2022-10-25 NOTE — Progress Notes (Signed)
Patient presents for 1 week postop follow-up following anterior repair with TOT and sling. She states experiencing pressure when she sits down, denies any pain or discomfort with urination.

## 2022-10-29 LAB — URINE CULTURE

## 2022-10-31 ENCOUNTER — Other Ambulatory Visit: Payer: Self-pay

## 2022-10-31 DIAGNOSIS — B9620 Unspecified Escherichia coli [E. coli] as the cause of diseases classified elsewhere: Secondary | ICD-10-CM

## 2022-10-31 MED ORDER — NITROFURANTOIN MONOHYD MACRO 100 MG PO CAPS: 100.0000 mg | ORAL_CAPSULE | Freq: Two times a day (BID) | ORAL | 0 refills | Status: DC

## 2022-11-04 LAB — HM MAMMOGRAPHY

## 2022-11-11 ENCOUNTER — Ambulatory Visit (INDEPENDENT_AMBULATORY_CARE_PROVIDER_SITE_OTHER): Payer: Commercial Managed Care - PPO

## 2022-11-11 VITALS — BP 130/80 | Ht 64.0 in | Wt 132.0 lb

## 2022-11-11 DIAGNOSIS — R300 Dysuria: Secondary | ICD-10-CM

## 2022-11-11 LAB — POCT URINALYSIS DIPSTICK
Bilirubin, UA: NEGATIVE
Blood, UA: POSITIVE
Glucose, UA: NEGATIVE
Ketones, UA: NEGATIVE
Nitrite, UA: NEGATIVE
Protein, UA: NEGATIVE
Spec Grav, UA: 1.015 (ref 1.010–1.025)
Urobilinogen, UA: 1 E.U./dL
pH, UA: 7.5 (ref 5.0–8.0)

## 2022-11-11 NOTE — Patient Instructions (Signed)
Urinary Tract Infection, Adult A urinary tract infection (UTI) is an infection of any part of the urinary tract. The urinary tract includes: The kidneys. The ureters. The bladder. The urethra. These organs make, store, and get rid of pee (urine) in the body. What are the causes? This infection is caused by germs (bacteria) in your genital area. These germs grow and cause swelling (inflammation) of your urinary tract. What increases the risk? The following factors may make you more likely to develop this condition: Using a small, thin tube (catheter) to drain pee. Not being able to control when you pee or poop (incontinence). Being female. If you are female, these things can increase the risk: Using these methods to prevent pregnancy: A medicine that kills sperm (spermicide). A device that blocks sperm (diaphragm). Having low levels of a female hormone (estrogen). Being pregnant. You are more likely to develop this condition if: You have genes that add to your risk. You are sexually active. You take antibiotic medicines. You have trouble peeing because of: A prostate that is bigger than normal, if you are female. A blockage in the part of your body that drains pee from the bladder. A kidney stone. A nerve condition that affects your bladder. Not getting enough to drink. Not peeing often enough. You have other conditions, such as: Diabetes. A weak disease-fighting system (immune system). Sickle cell disease. Gout. Injury of the spine. What are the signs or symptoms? Symptoms of this condition include: Needing to pee right away. Peeing small amounts often. Pain or burning when peeing. Blood in the pee. Pee that smells bad or not like normal. Trouble peeing. Pee that is cloudy. Fluid coming from the vagina, if you are female. Pain in the belly or lower back. Other symptoms include: Vomiting. Not feeling hungry. Feeling mixed up (confused). This may be the first symptom in  older adults. Being tired and grouchy (irritable). A fever. Watery poop (diarrhea). How is this treated? Taking antibiotic medicine. Taking other medicines. Drinking enough water. In some cases, you may need to see a specialist. Follow these instructions at home:  Medicines Take over-the-counter and prescription medicines only as told by your doctor. If you were prescribed an antibiotic medicine, take it as told by your doctor. Do not stop taking it even if you start to feel better. General instructions Make sure you: Pee until your bladder is empty. Do not hold pee for a long time. Empty your bladder after sex. Wipe from front to back after peeing or pooping if you are a female. Use each tissue one time when you wipe. Drink enough fluid to keep your pee pale yellow. Keep all follow-up visits. Contact a doctor if: You do not get better after 1-2 days. Your symptoms go away and then come back. Get help right away if: You have very bad back pain. You have very bad pain in your lower belly. You have a fever. You have chills. You feeling like you will vomit or you vomit. Summary A urinary tract infection (UTI) is an infection of any part of the urinary tract. This condition is caused by germs in your genital area. There are many risk factors for a UTI. Treatment includes antibiotic medicines. Drink enough fluid to keep your pee pale yellow. This information is not intended to replace advice given to you by your health care provider. Make sure you discuss any questions you have with your health care provider. Document Revised: 05/01/2020 Document Reviewed: 05/01/2020 Elsevier Patient Education    2023 [employer]  

## 2022-11-11 NOTE — Progress Notes (Signed)
    NURSE VISIT NOTE  Subjective:    Patient ID: Jill Porter, female    DOB: March 24, 1966, 57 y.o.   MRN: [id number]       HPI  Patient is a 57 y.o. [id number] female who presents for dysuria, urinary frequency, and urinary urgency for 2 days.  Patient denies abdominal pain, pelvic pain, cloudy malordorous urine, genital rash, genital irritation, and vaginal discharge.  Patient does have a history of recurrent UTI.  Patient does not have a history of pyelonephritis.    Objective:    BP 130/80   Ht '5\' 4"'$  (1.626 m)   Wt 132 lb (59.9 kg)   BMI 22.66 kg/m    Lab Review  No results found for any visits on 11/11/22.  Assessment:   1. Dysuria      Plan:   Urine Culture Sent.   Quintella Baton, CMA

## 2022-11-18 ENCOUNTER — Telehealth: Payer: Self-pay

## 2022-11-18 LAB — URINE CULTURE

## 2022-11-18 NOTE — Telephone Encounter (Signed)
Called pt to f/up on urine culture results, Pt states she has one more day of antibiotics, but she is feeling fine. Pt states she seen her PCP the same day she came here and they sent in [place]. Pt aware to call us if she experiencing UTI symptoms after the Medication is finished.

## 2022-11-22 ENCOUNTER — Other Ambulatory Visit: Payer: Self-pay

## 2022-11-30 ENCOUNTER — Encounter: Payer: Commercial Managed Care - PPO | Admitting: Obstetrics and Gynecology

## 2022-11-30 DIAGNOSIS — Z9889 Other specified postprocedural states: Secondary | ICD-10-CM

## 2022-12-12 ENCOUNTER — Encounter: Payer: Self-pay | Admitting: Internal Medicine

## 2022-12-12 ENCOUNTER — Other Ambulatory Visit: Payer: Self-pay

## 2022-12-12 ENCOUNTER — Emergency Department: Payer: Commercial Managed Care - PPO

## 2022-12-12 ENCOUNTER — Observation Stay: Payer: Commercial Managed Care - PPO

## 2022-12-12 ENCOUNTER — Observation Stay
Admission: EM | Admit: 2022-12-12 | Discharge: 2022-12-14 | Disposition: A | Discharge status: Home / Self Care | Payer: Commercial Managed Care - PPO | Attending: Student | Admitting: Student

## 2022-12-12 DIAGNOSIS — F418 Other specified anxiety disorders: Secondary | ICD-10-CM | POA: Diagnosis present

## 2022-12-12 DIAGNOSIS — R531 Weakness: Secondary | ICD-10-CM

## 2022-12-12 DIAGNOSIS — G459 Transient cerebral ischemic attack, unspecified: Principal | ICD-10-CM | POA: Diagnosis present

## 2022-12-12 DIAGNOSIS — E783 Hyperchylomicronemia: Secondary | ICD-10-CM | POA: Insufficient documentation

## 2022-12-12 DIAGNOSIS — R4789 Other speech disturbances: Secondary | ICD-10-CM

## 2022-12-12 DIAGNOSIS — Z79899 Other long term (current) drug therapy: Secondary | ICD-10-CM | POA: Insufficient documentation

## 2022-12-12 DIAGNOSIS — E876 Hypokalemia: Secondary | ICD-10-CM | POA: Insufficient documentation

## 2022-12-12 DIAGNOSIS — R4182 Altered mental status, unspecified: Secondary | ICD-10-CM

## 2022-12-12 DIAGNOSIS — R471 Dysarthria and anarthria: Secondary | ICD-10-CM | POA: Diagnosis not present

## 2022-12-12 DIAGNOSIS — R2990 Unspecified symptoms and signs involving the nervous system: Secondary | ICD-10-CM | POA: Diagnosis not present

## 2022-12-12 DIAGNOSIS — E785 Hyperlipidemia, unspecified: Secondary | ICD-10-CM | POA: Diagnosis present

## 2022-12-12 DIAGNOSIS — I10 Essential (primary) hypertension: Secondary | ICD-10-CM | POA: Diagnosis present

## 2022-12-12 LAB — APTT: aPTT: 27 seconds (ref 24–36)

## 2022-12-12 LAB — CBC
HCT: 40.2 % (ref 36.0–46.0)
Hemoglobin: 13.4 g/dL (ref 12.0–15.0)
MCH: 28.8 pg (ref 26.0–34.0)
MCHC: 33.3 g/dL (ref 30.0–36.0)
MCV: 86.5 fL (ref 80.0–100.0)
Platelets: 293 10*3/uL (ref 150–400)
RBC: 4.65 MIL/uL (ref 3.87–5.11)
RDW: 12.7 % (ref 11.5–15.5)
WBC: 6.6 10*3/uL (ref 4.0–10.5)
nRBC: 0 % (ref 0.0–0.2)

## 2022-12-12 LAB — URINALYSIS, ROUTINE W REFLEX MICROSCOPIC
Bilirubin Urine: NEGATIVE
Glucose, UA: NEGATIVE mg/dL
Ketones, ur: NEGATIVE mg/dL
Leukocytes,Ua: NEGATIVE
Nitrite: NEGATIVE
Protein, ur: NEGATIVE mg/dL
Specific Gravity, Urine: 1.005 (ref 1.005–1.030)
pH: 7 (ref 5.0–8.0)

## 2022-12-12 LAB — DIFFERENTIAL
Abs Immature Granulocytes: 0.01 10*3/uL (ref 0.00–0.07)
Basophils Absolute: 0.1 10*3/uL (ref 0.0–0.1)
Basophils Relative: 1 %
Eosinophils Absolute: 0.1 10*3/uL (ref 0.0–0.5)
Eosinophils Relative: 1 %
Immature Granulocytes: 0 %
Lymphocytes Relative: 29 %
Lymphs Abs: 1.9 10*3/uL (ref 0.7–4.0)
Monocytes Absolute: 0.5 10*3/uL (ref 0.1–1.0)
Monocytes Relative: 7 %
Neutro Abs: 4.1 10*3/uL (ref 1.7–7.7)
Neutrophils Relative %: 62 %

## 2022-12-12 LAB — URINE DRUG SCREEN, QUALITATIVE (ARMC ONLY)
Amphetamines, Ur Screen: NOT DETECTED
Barbiturates, Ur Screen: NOT DETECTED
Benzodiazepine, Ur Scrn: NOT DETECTED
Cannabinoid 50 Ng, Ur ~~LOC~~: NOT DETECTED
Cocaine Metabolite,Ur ~~LOC~~: NOT DETECTED
MDMA (Ecstasy)Ur Screen: NOT DETECTED
Methadone Scn, Ur: NOT DETECTED
Opiate, Ur Screen: NOT DETECTED
Phencyclidine (PCP) Ur S: NOT DETECTED
Tricyclic, Ur Screen: NOT DETECTED

## 2022-12-12 LAB — COMPREHENSIVE METABOLIC PANEL
ALT: 19 U/L (ref 0–44)
AST: 21 U/L (ref 15–41)
Albumin: 4 g/dL (ref 3.5–5.0)
Alkaline Phosphatase: 62 U/L (ref 38–126)
Anion gap: 8 (ref 5–15)
BUN: 15 mg/dL (ref 6–20)
CO2: 27 mmol/L (ref 22–32)
Calcium: 9.1 mg/dL (ref 8.9–10.3)
Chloride: 100 mmol/L (ref 98–111)
Creatinine, Ser: 0.9 mg/dL (ref 0.44–1.00)
GFR, Estimated: >60 mL/min (ref >60)
Glucose, Bld: 100 mg/dL — ABNORMAL HIGH (ref 70–99)
Potassium: 2.9 mmol/L — ABNORMAL LOW (ref 3.5–5.1)
Sodium: 135 mmol/L (ref 135–145)
Total Bilirubin: 0.4 mg/dL (ref 0.3–1.2)
Total Protein: 8.1 g/dL (ref 6.5–8.1)

## 2022-12-12 LAB — CBG MONITORING, ED: Glucose-Capillary: 100 mg/dL — ABNORMAL HIGH (ref 70–99)

## 2022-12-12 LAB — MAGNESIUM: Magnesium: 2.3 mg/dL (ref 1.7–2.4)

## 2022-12-12 LAB — GLUCOSE, CAPILLARY: Glucose-Capillary: 104 mg/dL — ABNORMAL HIGH (ref 70–99)

## 2022-12-12 LAB — ETHANOL: Alcohol, Ethyl (B): <10 mg/dL (ref <10)

## 2022-12-12 LAB — PROTIME-INR
INR: 0.9 (ref 0.8–1.2)
Prothrombin Time: 12.4 seconds (ref 11.4–15.2)

## 2022-12-12 LAB — PHOSPHORUS: Phosphorus: 3.7 mg/dL (ref 2.5–4.6)

## 2022-12-12 MED ORDER — POTASSIUM CHLORIDE CRYS ER 20 MEQ PO TBCR
40.0000 meq | EXTENDED_RELEASE_TABLET | Freq: Once | ORAL | Status: AC
  Administered 2022-12-12: 40 meq via ORAL
  Filled 2022-12-12: qty 2

## 2022-12-12 MED ORDER — ATORVASTATIN CALCIUM 20 MG PO TABS
40.0000 mg | ORAL_TABLET | Freq: Every day | ORAL | Status: DC
  Administered 2022-12-12 – 2022-12-13 (×2): 40 mg via ORAL
  Filled 2022-12-12 (×2): qty 2

## 2022-12-12 MED ORDER — ZOLPIDEM TARTRATE 5 MG PO TABS: 10.0000 mg | ORAL_TABLET | Freq: Every evening | ORAL | Status: DC | PRN

## 2022-12-12 MED ORDER — ASPIRIN 325 MG PO TABS
325.0000 mg | ORAL_TABLET | Freq: Every day | ORAL | Status: DC
  Administered 2022-12-12 – 2022-12-13 (×2): 325 mg via ORAL
  Filled 2022-12-12 (×2): qty 1

## 2022-12-12 MED ORDER — ENOXAPARIN SODIUM 40 MG/0.4ML IJ SOSY
40.0000 mg | PREFILLED_SYRINGE | INTRAMUSCULAR | Status: DC
  Administered 2022-12-12 – 2022-12-13 (×2): 40 mg via SUBCUTANEOUS
  Filled 2022-12-12 (×2): qty 0.4

## 2022-12-12 MED ORDER — LORAZEPAM 0.5 MG PO TABS: 0.5000 mg | ORAL_TABLET | Freq: Three times a day (TID) | ORAL | Status: DC | PRN

## 2022-12-12 MED ORDER — IOHEXOL 350 MG/ML SOLN
75.0000 mL | Freq: Once | INTRAVENOUS | Status: AC | PRN
  Administered 2022-12-12: 75 mL via INTRAVENOUS

## 2022-12-12 MED ORDER — CLOPIDOGREL BISULFATE 75 MG PO TABS
75.0000 mg | ORAL_TABLET | Freq: Every day | ORAL | Status: DC
  Administered 2022-12-12 – 2022-12-14 (×3): 75 mg via ORAL
  Filled 2022-12-12 (×3): qty 1

## 2022-12-12 MED ORDER — ACETAMINOPHEN 160 MG/5ML PO SOLN: 650.0000 mg | ORAL | Status: DC | PRN

## 2022-12-12 MED ORDER — SENNOSIDES-DOCUSATE SODIUM 8.6-50 MG PO TABS: 1.0000 | ORAL_TABLET | Freq: Every evening | ORAL | Status: DC | PRN

## 2022-12-12 MED ORDER — ACETAMINOPHEN 650 MG RE SUPP: 650.0000 mg | RECTAL | Status: DC | PRN

## 2022-12-12 MED ORDER — ASPIRIN 300 MG RE SUPP
300.0000 mg | Freq: Every day | RECTAL | Status: DC
  Filled 2022-12-12 (×2): qty 1

## 2022-12-12 MED ORDER — ACETAMINOPHEN 325 MG PO TABS
650.0000 mg | ORAL_TABLET | ORAL | Status: DC | PRN
  Administered 2022-12-12: 650 mg via ORAL
  Filled 2022-12-12: qty 2

## 2022-12-12 MED ORDER — POTASSIUM CHLORIDE 10 MEQ/100ML IV SOLN
10.0000 meq | INTRAVENOUS | Status: AC
  Administered 2022-12-12: 10 meq via INTRAVENOUS
  Filled 2022-12-12: qty 100

## 2022-12-12 MED ORDER — ESTRADIOL 0.5 MG PO TABS
1.0000 mg | ORAL_TABLET | Freq: Every evening | ORAL | Status: DC
  Administered 2022-12-12 – 2022-12-13 (×2): 1 mg via ORAL
  Filled 2022-12-12 (×3): qty 2

## 2022-12-12 MED ORDER — ONDANSETRON HCL 4 MG/2ML IJ SOLN: 4.0000 mg | Freq: Three times a day (TID) | INTRAMUSCULAR | Status: DC | PRN

## 2022-12-12 MED ORDER — STROKE: EARLY STAGES OF RECOVERY BOOK: Freq: Once | Status: AC

## 2022-12-12 MED ORDER — HYDRALAZINE HCL 20 MG/ML IJ SOLN: 5.0000 mg | INTRAMUSCULAR | Status: DC | PRN

## 2022-12-12 MED ORDER — SODIUM CHLORIDE 0.9 % IV SOLN: INTRAVENOUS | Status: DC | PRN

## 2022-12-12 NOTE — ED Triage Notes (Signed)
First Nurse Note:  Arrives c/o left sided numbness and slurred speech PTA.  Patient AAOx3.  Skin warm and dry.  MAE equally and strong.  Equal facial movements.  Speech clear.  Equal sensation . NAD

## 2022-12-12 NOTE — ED Provider Notes (Signed)
[facility] Provider Note    Event Date/Time   First MD Initiated Contact with Patient 12/12/22 1420    (approximate)   History   Chief Complaint: Weakness   HPI  Jill Porter is a 57 y.o. female with a past history of hypertension and GERD who comes ED complaining of facial droop and slurred speech that started at 12:45 PM today.  Associated with left-sided paresthesia and feeling of dizziness as well.  On arrival to the ED, symptoms have improved.  Denies any trauma or fever, no neck pain or headache.  No syncope or seizure.     Physical Exam   Triage Vital Signs: ED Triage Vitals  Enc Vitals Group     BP 12/12/22 1417 (!) 188/112     Pulse Rate 12/12/22 1417 (!) 103     Resp 12/12/22 1417 18     Temp 12/12/22 1418 (!) 97.5 F (36.4 C)     Temp Source 12/12/22 1417 Oral     SpO2 12/12/22 1417 99 %     Weight 12/12/22 1417 132 lb 0.9 oz (59.9 kg)     Height 12/12/22 1417 '5\' 4"'$  (1.626 m)     Head Circumference --      Peak Flow --      Pain Score 12/12/22 1417 5     Pain Loc --      Pain Edu? --      Excl. in [place]? --     Most recent vital signs: Vitals:   12/12/22 1500 12/12/22 1515  BP: (!) 179/86   Pulse: 93 93  Resp: 20 16  Temp:    SpO2: 100% 100%    General: Awake, no distress.  CV:  Good peripheral perfusion.  Resp:  Normal effort.  Abd:  No distention.  Other:  Cranial nerves II through XII intact, language intact.   ED Results / Procedures / Treatments   Labs (all labs ordered are listed, but only abnormal results are displayed) Labs Reviewed  URINALYSIS, ROUTINE W REFLEX MICROSCOPIC - Abnormal; Notable for the following components:      Result Value   Color, Urine COLORLESS (*)    APPearance CLEAR (*)    Hgb urine dipstick SMALL (*)    Bacteria, UA RARE (*)    All other components within normal limits  CBG MONITORING, ED - Abnormal; Notable for the following components:   Glucose-Capillary 100 (*)    All  other components within normal limits  PROTIME-INR  APTT  CBC  DIFFERENTIAL  ETHANOL  COMPREHENSIVE METABOLIC PANEL  URINE DRUG SCREEN, QUALITATIVE ([facility] ONLY)  POC URINE PREG, ED     EKG Interpreted by me Sinus tachycardia rate 101.  Normal axis and intervals.  Poor R wave progression.  Normal ST segments and T waves   RADIOLOGY CT head interpreted by me, no mass or intracranial hemorrhage.  No obvious infarct.  Radiology report reviewed.   PROCEDURES:  Procedures   MEDICATIONS ORDERED IN ED: Medications - No data to display   IMPRESSION / MDM / [place] / ED COURSE  I reviewed the triage vital signs and the nursing notes.  DDx: CVA, intracranial hemorrhage, electrolyte abnormality, arrhythmia  Patient's presentation is most consistent with acute presentation with potential threat to life or bodily function.  Patient presents with facial droop and slurred speech which started at 12:45 PM today.  Code stroke initiated on arrival to the ED.  CT head unremarkable.  Discussed with neurology Dr. Quinn Axe who notes resolution of symptoms, recommends ED observation until 5:15 PM to ensure patient does not have worsening symptoms warranting TNK.  At that point, patient can be hospitalized for further stroke/TIA workup.       FINAL CLINICAL IMPRESSION(S) / ED DIAGNOSES   Final diagnoses:  TIA (transient ischemic attack)     Rx / DC Orders   ED Discharge Orders     None        Note:  This document was prepared using Dragon voice recognition software and may include unintentional dictation errors.   Carrie Mew, MD 12/12/22 1535

## 2022-12-12 NOTE — H&P (Addendum)
History and Physical    Jill Porter [id number] DOB: 1966/09/24 DOA: 12/12/2022  Referring MD/NP/PA:   PCP: Eulas Post, MD   Patient coming from:  The patient is coming from home.     Chief Complaint: Left-sided weakness, slurred speech, dizziness  HPI: Jill Porter is a 57 y.o. female with medical history significant of hypertension, hyperlipidemia, GERD, depression with anxiety, melanoma, who presents with left-sided weakness, slurred speech and dizziness.  Patient states that her symptoms started at about 1245 today.  She had slurred speech, left facial droop, left-sided weakness and numbness.  She also reports dizziness and poor balance.  No vision loss or hearing loss.  Denies chest pain, cough, shortness breath.  No nausea, vomiting, diarrhea or abdominal pain.  No symptoms of UTI.  Her slurred speech and left facial droop have resolved.  She still feels heavy in both left arm and left leg.   Data reviewed independently and ED Course: pt was found to have WBC 6.6, INR 0.9, PTT 27, negative UDS, potassium 2.9, temperature 97.5, blood pressure 175/106, heart rate 103, 95, RR 23, oxygen saturation 100% on room air.  CT of head is negative for acute intracranial abnormalities.  Patient is placed on telemetry bed for observation.  Dr. Quinn Axe of neurology is consulted.   EKG: I have personally reviewed.  Sinus rhythm, QTc 453, low voltage.  Review of Systems:   General: no fevers, chills, no body weight gain, has fatigue HEENT: no blurry vision, hearing changes or sore throat Respiratory: no dyspnea, coughing, wheezing CV: no chest pain, no palpitations GI: no nausea, vomiting, abdominal pain, diarrhea, constipation GU: no dysuria, burning on urination, increased urinary frequency, hematuria  Ext: no leg edema Neuro: Has slurred speech, left facial droop, left-sided weakness and numbness, dizziness Skin: no rash, no skin tear MSK: No muscle spasm, no deformity, no  limitation of range of movement in spin Heme: No easy bruising.  Travel history: No recent long distant travel.   Allergy:  Allergies  Allergen Reactions   Sulfa Antibiotics Hives   Penicillins Itching and Rash   Tape Rash    Past Medical History:  Diagnosis Date   Cervical dysphagia    Climacteric    GERD (gastroesophageal reflux disease)    occ   Hypertension    Melanoma ([place])    Motion sickness    anytime moving   Pelvic adhesions    Recurrent cystitis     Past Surgical History:  Procedure Laterality Date   ABDOMINAL HYSTERECTOMY  2000   due to pre cancerous cells   BASAL CELL CARCINOMA EXCISION  2013   removed from forehead and Lt [place]   BREAST CYST ASPIRATION  01/2010   CHOLECYSTECTOMY  06/19/2012   COLONOSCOPY WITH PROPOFOL N/A 07/06/2015   Procedure: COLONOSCOPY WITH PROPOFOL;  Surgeon: [name], MD;  Location: [place];  Service: Endoscopy;  Laterality: N/A;   CYSTOCELE REPAIR N/A 10/17/2022   Procedure: ANTERIOR REPAIR;  Surgeon: Harlin Heys, MD;  Location: [facility];  Service: Gynecology;  Laterality: N/A;   EXPLORATORY LAPAROTOMY     lysis of adhesions   LIPOSUCTION     MELANOMA EXCISION  06/2012   Removed from Rt shoulder   OVARY SURGERY  1997   hx of removal of ovary and adhesions   PARATHYROIDECTOMY  04/14/2017   PUBOVAGINAL SLING N/A 10/17/2022   Procedure: PUBO-VAGINAL SLING TOT;  Surgeon: Harlin Heys, MD;  Location: [facility];  Service:  Gynecology;  Laterality: N/A;   ROTATOR CUFF REPAIR Left     Social History:  reports that she has never smoked. She has never been exposed to tobacco smoke. She has never used smokeless tobacco. She reports that she does not drink alcohol and does not use drugs.  Family History:  Family History  Problem Relation Age of Onset   Hypertension Mother    Lymphoma Mother    Hypertension Father    Heart attack Maternal Grandfather 34       MI   Cancer Neg Hx    Diabetes Neg Hx     Heart disease Neg Hx    Kidney cancer Neg Hx    Bladder Cancer Neg Hx      Prior to Admission medications   Medication Sig Start Date End Date Taking? Authorizing Provider  calcium carbonate (TUMS - DOSED IN MG ELEMENTAL CALCIUM) 500 MG chewable tablet Chew 1 tablet by mouth as needed for indigestion or heartburn.    [provider]  estradiol (ESTRACE) 1 MG tablet Take 1 tablet (1 mg total) by mouth daily. 07/26/22   Harlin Heys, MD  hydrochlorothiazide (HYDRODIURIL) 25 MG tablet TAKE 1 TABLET BY MOUTH EVERY DAY Patient taking differently: Take 25 mg by mouth every morning. 11/09/21   Eulas Post, MD  ibuprofen (ADVIL) 600 MG tablet Take 1 tablet (600 mg total) by mouth every 6 (six) hours as needed. 10/17/22   Harlin Heys, MD  losartan (COZAAR) 100 MG tablet TAKE 1/2 TABLET BY MOUTH EVERY DAY Patient taking differently: Take 50 mg by mouth 2 (two) times daily. 03/08/22   Eulas Post, MD  nitrofurantoin, macrocrystal-monohydrate, (MACROBID) 100 MG capsule Take 1 capsule (100 mg total) by mouth 2 (two) times daily. 10/31/22   Harlin Heys, MD  zolpidem (AMBIEN) 10 MG tablet TAKE 1 TABLET (10 MG TOTAL) BY MOUTH AT BEDTIME AS NEEDED. FOR SLEEP 06/22/22   Eulas Post, MD    Physical Exam: Vitals:   12/12/22 1515 12/12/22 1530 12/12/22 1600 12/12/22 1800  BP:  (!) 168/92 (!) 175/106 (!) 171/87  Pulse: 93 93 95 98  Resp: 16 18 (!) 23 18  Temp:    98.2 F (36.8 C)  TempSrc:    Oral  SpO2: 100% 100% 100% 100%  Weight:      Height:       General: Not in acute distress HEENT:       Eyes: PERRL, EOMI, no scleral icterus.       ENT: No discharge from the ears and nose, no pharynx injection, no tonsillar enlargement.        Neck: No JVD, no bruit, no mass felt. Heme: No neck lymph node enlargement. Cardiac: S1/S2, RRR, No murmurs, No gallops or rubs. Respiratory: No rales, wheezing, rhonchi or rubs. GI: Soft, nondistended, nontender, no rebound  pain, no organomegaly, BS present. GU: No hematuria Ext: No pitting leg edema bilaterally. 1+DP/PT pulse bilaterally. Musculoskeletal: No joint deformities, No joint redness or warmth, no limitation of ROM in spin. Skin: No rashes.  Neuro: Alert, oriented X3, cranial nerves II-XII grossly intact, muscle strength is minimally weaker in the left arm and leg, sensation to light touch intact. Brachial reflex 2+ bilaterally. Knee reflex 1+ bilaterally. Marland Kitchen Psych: Patient is not psychotic, no suicidal or hemocidal ideation.  Labs on Admission: I have personally reviewed following labs and imaging studies  CBC: Recent Labs  Lab 12/12/22 1438  WBC 6.6  NEUTROABS  4.1  HGB 13.4  HCT 40.2  MCV 86.5  PLT 0000000   Basic Metabolic Panel: Recent Labs  Lab 12/12/22 1438  NA 135  K 2.9*  CL 100  CO2 27  GLUCOSE 100*  BUN 15  CREATININE 0.90  CALCIUM 9.1   GFR: Estimated Creatinine Clearance: 59.6 mL/min (by C-G formula based on SCr of 0.9 mg/dL). Liver Function Tests: Recent Labs  Lab 12/12/22 1438  AST 21  ALT 19  ALKPHOS 62  BILITOT 0.4  PROT 8.1  ALBUMIN 4.0   No results for input(s): "LIPASE", "AMYLASE" in the last 168 hours. No results for input(s): "AMMONIA" in the last 168 hours. Coagulation Profile: Recent Labs  Lab 12/12/22 1438  INR 0.9   Cardiac Enzymes: No results for input(s): "CKTOTAL", "CKMB", "CKMBINDEX", "TROPONINI" in the last 168 hours. BNP (last 3 results) No results for input(s): "PROBNP" in the last 8760 hours. HbA1C: No results for input(s): "HGBA1C" in the last 72 hours. CBG: Recent Labs  Lab 12/12/22 1418  GLUCAP 100*   Lipid Profile: No results for input(s): "CHOL", "HDL", "LDLCALC", "TRIG", "CHOLHDL", "LDLDIRECT" in the last 72 hours. Thyroid Function Tests: No results for input(s): "TSH", "T4TOTAL", "FREET4", "T3FREE", "THYROIDAB" in the last 72 hours. Anemia Panel: No results for input(s): "VITAMINB12", "FOLATE", "FERRITIN", "TIBC",  "IRON", "RETICCTPCT" in the last 72 hours. Urine analysis:    Component Value Date/Time   COLORURINE COLORLESS (A) 12/12/2022 1438   APPEARANCEUR CLEAR (A) 12/12/2022 1438   APPEARANCEUR Clear 03/28/2022 1100   LABSPEC 1.005 12/12/2022 1438   PHURINE 7.0 12/12/2022 1438   GLUCOSEU NEGATIVE 12/12/2022 1438   HGBUR SMALL (A) 12/12/2022 1438   BILIRUBINUR NEGATIVE 12/12/2022 1438   BILIRUBINUR Negative 11/11/2022 1341   BILIRUBINUR Negative 03/28/2022 1100   KETONESUR NEGATIVE 12/12/2022 1438   PROTEINUR NEGATIVE 12/12/2022 1438   UROBILINOGEN 1.0 11/11/2022 1341   NITRITE NEGATIVE 12/12/2022 1438   LEUKOCYTESUR NEGATIVE 12/12/2022 1438   Sepsis Labs: '@LABRCNTIP'$ (procalcitonin:4,lacticidven:4) )No results found for this or any previous visit (from the past 240 hour(s)).   Radiological Exams on Admission: CT HEAD CODE STROKE WO CONTRAST  Result Date: 12/12/2022 CLINICAL DATA:  Code stroke. Neuro deficit, acute, stroke suspected. Dizziness. Left-sided weakness. Slurred speech. EXAM: CT HEAD WITHOUT CONTRAST TECHNIQUE: Contiguous axial images were obtained from the base of the skull through the vertex without intravenous contrast. RADIATION DOSE REDUCTION: This exam was performed according to the departmental dose-optimization program which includes automated exposure control, adjustment of the mA and/or kV according to patient size and/or use of iterative reconstruction technique. COMPARISON:  Brain MRI 06/05/2018. Head CT 06/26/2013. FINDINGS: Brain: No age advanced or lobar predominant parenchymal atrophy. There is no acute intracranial hemorrhage. No demarcated cortical infarct. No extra-axial fluid collection. No evidence of an intracranial mass. No midline shift. Vascular: No hyperdense vessel. Atherosclerotic calcifications. Skull: No fracture or aggressive osseous lesion. Sinuses/Orbits: No mass or acute finding within the imaged orbits. No significant paranasal sinus disease at the  imaged levels. ASPECTS ([place] Stroke Program Early CT Score) - Ganglionic level infarction (caudate, lentiform nuclei, internal capsule, insula, M1-M3 cortex): 7 - Supraganglionic infarction (M4-M6 cortex): 3 Total score (0-10 with 10 being normal): 10 These results were communicated to Dr. Quinn Axe At 2:43 pmon 12/12/2022 by text page via the [facility] messaging system. IMPRESSION: No evidence of an acute intracranial abnormality. ASPECTS is 10. Electronically Signed   By: Kellie Simmering D.O.   On: 12/12/2022 14:43      Assessment/Plan Principal Problem:  TIA (transient ischemic attack) Active Problems:   Hypertension   Hyperlipemia   Depression with anxiety   Assessment and Plan:  TIA (transient ischemic attack) vs. mild stroke: Her symptoms have largely resolved.  CT head negative.  Consulted Dr. Quinn Axe of neurology.  -Placed on tele med bed for observation -Obtain MRI-brain  -CTA of  head and neck - will hold oral Bp meds to allow permissive HTN  -Start aspirin and Lipitor - fasting lipid panel and HbA1c  - 2D transthoracic echocardiography  - swallowing screen. If fails, will get SLP - PT/OT consult  Addendum: After patient arrived to the floor, patient had another episode of slurred speech and left-sided weakness which lasted for about 5 minutes, then resolving.  Patient is out of window for tPA. -Will change MRI and CTA to stat.  Hypertension -Hold blood pressure medications to allow permissive hypertension -IV hydralazine for SBP>220 or dBP>120  Hyperlipemia -Started to 40 mg daily  Depression with anxiety: Patient's not taking medications. -As needed Ativan      DVT ppx:  SQ Lovenox  Code Status: Full code  Family Communication:   Yes, patient's husband at bed side.   Disposition Plan:  Anticipate discharge back to previous environment  Consults called:  Dr. Quinn Axe of neuro  Admission status and Level of care: Telemetry Medical:    for obs   Dispo: The patient  is from: Home              Anticipated d/c is to: Home              Anticipated d/c date is: 1 day              Patient currently is not medically stable to d/c.    Severity of Illness:  The appropriate patient status for this patient is INPATIENT. Inpatient status is judged to be reasonable and necessary in order to provide the required intensity of service to ensure the patient's safety. The patient's presenting symptoms, physical exam findings, and initial radiographic and laboratory data in the context of their chronic comorbidities is felt to place them at high risk for further clinical deterioration. Furthermore, it is not anticipated that the patient will be medically stable for discharge from the hospital within 2 midnights of admission.   * I certify that at the point of admission it is my clinical judgment that the patient will require inpatient hospital care spanning beyond 2 midnights from the point of admission due to high intensity of service, high risk for further deterioration and high frequency of surveillance required.*       Date of Service 12/12/2022    Ivor Costa [facility]   If 7PM-7AM, please contact night-coverage [email or web address] 12/12/2022, 6:30 PM

## 2022-12-12 NOTE — Significant Event (Signed)
Rapid Response Event Note   Reason for Call :  Worsening neuro symptoms  Initial Focused Assessment:  Rapid response RN arrived in patient's room with patient lying in bed surrounded by 1C staff and patient's husband. Per patient's RN Gerald Stabs, patient had been admitted for stroke symptoms, but her initial assessment upon arrival from the ED only had a deficit for numbness on left side of her face/jaw. He had left the room briefly at 19:50 and when he came back at her speech was slurred and she was unable to move her left leg.   When rapid response RN first arrived she appeared to have left sided droop, but as Mickle Plumb explained what happened, this appeared to resolve. Dr. Damita Dunnings arrived shortly after this RN and Dr. Blaine Hamper shortly after her. CBG 104. NIH then performed with score of 2, deficits noted with continued numbness in left face/jaw, and drift in left leg. Patient currently with mild headache that she has had since admission, but no dizziness. BP 150/106, MAP 119, HR 102, RR 18 unlabored, oxygen saturation 97 % on room air. Patient got a CT head without contrast at 14:33 as part of her initial workup.  Interventions:  Attempting to get a 20 g IV for CTA but was unsuccessful.  Plan of Care:  Dr. Blaine Hamper stated he would update the further imaging orders to STAT and that he had already ordered all of her code stroke workup. Per him and his discussion earlier with neurology, patient is outside the window for thrombolytics.   Event Summary:   MD Notified: Dr. Damita Dunnings and Dr. Blaine Hamper Call Time: 18:58 Arrival Time: 19:00 End Time: 19:17  Jill Porter, Jaynie Bream, RN

## 2022-12-12 NOTE — Progress Notes (Addendum)
D/w Dr. Damita Dunnings and Neomia Glass who reported that stroke code was called at shift change 2/2 increase in NIHSS from 1 to 3. Sx lasted 15 min and resolved when they were in the room evaluating the patient. Stroke code cancelled. They will change CTA H&N and MRI brain to STAT and f/u on those imaging results overnight. If patient develops recurrent sx, activate inpatient stroke code AND teleneurology (get the telecart and push button to activate). I am not available after 8pm but telestroke is staffed 24/7. If there are any other concerns overnight for example about her imaging results please contact Dr. Roland Rack the night neurohospitalist for [facility].  Su Monks, MD [facility] [phone]  If 7pm- 7am, please page neurology on call as listed in [place].

## 2022-12-12 NOTE — Progress Notes (Signed)
Chap responded to Code Stroke call. Family in the room "Mr Jill Porter" Pt husband - seated quietly waiting for his wife who was already at CT scan. Chap introduced Spiritual and emotional care to family. Family appreciative. Do not hesitate to have the nurse page [place] if need be.   12/12/22 1400  Spiritual Encounters  Type of Visit Initial  Care provided to: [facility] partners present during encounter Nurse  Referral source Code page  Reason for visit Code  OnCall Visit No  Spiritual Framework  Presenting Themes Courage hope and growth  Interventions  Spiritual Care Interventions Made Compassionate presence;Established relationship of care and support  Intervention Outcomes  Outcomes Awareness of support;Connection to spiritual care  Spiritual Care Plan  Spiritual Care Issues Still Outstanding No further spiritual care needs at this time (see row info)

## 2022-12-12 NOTE — ED Notes (Signed)
Code  stroke  called  to  [facility] 

## 2022-12-12 NOTE — Code Documentation (Signed)
Stroke Response Nurse Documentation Code Documentation  Shundra Ryanne Mihara is a 57 y.o. female arriving to [facility] via [place] on 12/12/2022 with past medical hx of HTN, GERD. On No antithrombotic. Code stroke was activated by ED.   Patient from home where she was LKW at 1245 and now complaining of left sided weakness, gait instability, and headache. Patient reports she was talking to her friend on the phone at 1245 when she began having acute left sided arm weakness and tingling, left facial droop, and couldn't talk. She called her husband and took an 81 mg asapirin before coming to the ED. Upon stroke response nurse assessment, pt was no longer experiencing left sided weakness. Gait instability and headache reported in triage.  Stroke team at the bedside in triage. Patient cleared for CT by Dr. Joni Fears. Patient to CT with team. NIHSS 0, see documentation for details and code stroke times. Patient with NIHSS 0 on exam. The following imaging was completed:  CT Head. Patient is not a candidate for IV Thrombolytic due to resolution of symptoms, per MD. Patient is not a candidate for IR due to exam not consistent with LVO per MD.   Care Plan: q30 minute NIHSS and vital signs until patient is outside window at 1715, swallow screen per protocol.   Bedside handoff with ED RN Lattie Haw.    Charise Carwin  Stroke Response RN

## 2022-12-12 NOTE — ED Triage Notes (Signed)
Pt here with a facial droop and slurred speech around 1245. Pt states she got really dizzy and states her left side got weak and tingly. Pt states her speech issue has resolved but she is still having weakness. Pt states she is still shaky and fatigued.

## 2022-12-12 NOTE — ED Provider Notes (Signed)
57 yo F here with acute onset stroke like sx, now resolved. Within tPA window until 5:15, thereafter plan to admit. CT Head shows NAICA. Dr. Quinn Axe has evaluated as pt was activated as a CODE STROKE.  Pt has persistent mild "heaviness" of left face per report but on my repeat exam, no other deficits. Discussed with Dr. Quinn Axe - will admit to medicine for stroke work-up.    Duffy Bruce, MD 12/13/22 0130

## 2022-12-12 NOTE — Progress Notes (Signed)
Inpatient stroke code was paged out for this patient shortly after she arrived to 1C. I was aware of this bc it came across on my pager that receives all [facility] stroke codes. Telestroke had not been activated so I spoke with bedside RN and charge RN and requested them to get the telecart and push the button.  Su Monks, MD [facility] [phone]  If 7pm- 7am, please page neurology on call as listed in [place].

## 2022-12-12 NOTE — Consult Note (Signed)
NEUROLOGY CONSULTATION NOTE   Date of service: December 12, 2022 Patient Name: Jill Porter MRN:  [id number] DOB:  1966-08-28 Reason for consult: stroke code Requesting physician: Dr. Carrie Mew _ _ _   _ __   _ __ _ _  __ __   _ __   __ _  History of Present Illness   This is a 57 yo woman with pmhx HTN, melanoma who presents after transient episode of L sided weakness, word-finding difficulty, and slurred speech at 1245 today which lasted approx 30 min. On my examination deficits had resolved and NIHSS = 0. CT head no acute abnl, personal review. TNK not administered 2/2 resolution of sx. CTA not performed 2/2 exam not c/w LVO.    ROS   Per HPI: all other systems reviewed and are negative  Past History   I have reviewed the following:  Past Medical History:  Diagnosis Date   Cervical dysphagia    Climacteric    GERD (gastroesophageal reflux disease)    occ   Hypertension    Melanoma ([place])    Motion sickness    anytime moving   Pelvic adhesions    Recurrent cystitis    Past Surgical History:  Procedure Laterality Date   ABDOMINAL HYSTERECTOMY  2000   due to pre cancerous cells   BASAL CELL CARCINOMA EXCISION  2013   removed from forehead and Lt [place]   BREAST CYST ASPIRATION  01/2010   CHOLECYSTECTOMY  06/19/2012   COLONOSCOPY WITH PROPOFOL N/A 07/06/2015   Procedure: COLONOSCOPY WITH PROPOFOL;  Surgeon: [name], MD;  Location: [facility];  Service: Endoscopy;  Laterality: N/A;   CYSTOCELE REPAIR N/A 10/17/2022   Procedure: ANTERIOR REPAIR;  Surgeon: Harlin Heys, MD;  Location: [facility];  Service: Gynecology;  Laterality: N/A;   EXPLORATORY LAPAROTOMY     lysis of adhesions   LIPOSUCTION     MELANOMA EXCISION  06/2012   Removed from Rt shoulder   OVARY SURGERY  1997   hx of removal of ovary and adhesions   PARATHYROIDECTOMY  04/14/2017   PUBOVAGINAL SLING N/A 10/17/2022   Procedure: PUBO-VAGINAL SLING TOT;  Surgeon: Harlin Heys, MD;  Location: [facility];  Service: Gynecology;  Laterality: N/A;   ROTATOR CUFF REPAIR Left    Family History  Problem Relation Age of Onset   Hypertension Mother    Lymphoma Mother    Hypertension Father    Heart attack Maternal Grandfather 14       MI   Cancer Neg Hx    Diabetes Neg Hx    Heart disease Neg Hx    Kidney cancer Neg Hx    Bladder Cancer Neg Hx    Social History   Socioeconomic History   Marital status: Married    Spouse name: Not on file   Number of children: Not on file   Years of education: Not on file   Highest education level: Not on file  Occupational History   Not on file  Tobacco Use   Smoking status: Never    Passive exposure: Never   Smokeless tobacco: Never  Vaping Use   Vaping Use: Never used  Substance and Sexual Activity   Alcohol use: No    Alcohol/week: 0.0 standard drinks of alcohol   Drug use: No   Sexual activity: Yes    Birth control/protection: Surgical    Comment: hysterectomy  Other Topics Concern   Not on file  Social  History Narrative   Not on file   Social Determinants of Health   Financial Resource Strain: Not on file  Food Insecurity: No Food Insecurity (12/12/2022)   Hunger Vital Sign    Worried About Running Out of Food in the Last Year: Never true    Ran Out of Food in the Last Year: Never true  Transportation Needs: No Transportation Needs (12/12/2022)   PRAPARE - Hydrologist (Medical): No    Lack of Transportation (Non-Medical): No  Physical Activity: Sufficiently Active (07/04/2018)   Exercise Vital Sign    Days of Exercise per Week: 5 days    Minutes of Exercise per Session: 30 min  Stress: Not on file  Social Connections: Not on file   Allergies  Allergen Reactions   Sulfa Antibiotics Hives   Penicillins Itching and Rash   Tape Rash    Medications   Medications Prior to Admission  Medication Sig Dispense Refill Last Dose   estradiol (ESTRACE) 1 MG tablet Take 1  tablet (1 mg total) by mouth daily. (Patient taking differently: Take 1 mg by mouth every evening.) 90 tablet 3 12/11/2022 at 2000   hydrochlorothiazide (HYDRODIURIL) 25 MG tablet TAKE 1 TABLET BY MOUTH EVERY DAY (Patient taking differently: Take 25 mg by mouth every morning.) 90 tablet 3 12/12/2022 at 0800   ibuprofen (ADVIL) 600 MG tablet Take 1 tablet (600 mg total) by mouth every 6 (six) hours as needed. 60 tablet 3 Unknown at PRN   losartan (COZAAR) 100 MG tablet TAKE 1/2 TABLET BY MOUTH EVERY DAY (Patient taking differently: Take 50 mg by mouth 2 (two) times daily.) 45 tablet 1 12/12/2022 at 0800   zolpidem (AMBIEN) 10 MG tablet TAKE 1 TABLET (10 MG TOTAL) BY MOUTH AT BEDTIME AS NEEDED. FOR SLEEP 30 tablet 4 Unknown at PRN   nitrofurantoin, macrocrystal-monohydrate, (MACROBID) 100 MG capsule Take 1 capsule (100 mg total) by mouth 2 (two) times daily. (Patient not taking: Reported on 12/12/2022) 14 capsule 0 Not Taking      Current Facility-Administered Medications:    [START ON 12/13/2022]  stroke: early stages of recovery book, , Does not apply, Once, Ivor Costa, MD   0.9 %  sodium chloride infusion, , Intravenous, PRN, Ivor Costa, MD, Stopped at 12/12/22 1821   acetaminophen (TYLENOL) tablet 650 mg, 650 mg, Oral, Q4H PRN **OR** acetaminophen (TYLENOL) 160 MG/5ML solution 650 mg, 650 mg, Per Tube, Q4H PRN **OR** acetaminophen (TYLENOL) suppository 650 mg, 650 mg, Rectal, Q4H PRN, Ivor Costa, MD   aspirin suppository 300 mg, 300 mg, Rectal, Daily **OR** aspirin tablet 325 mg, 325 mg, Oral, Daily, Ivor Costa, MD   atorvastatin (LIPITOR) tablet 40 mg, 40 mg, Oral, Daily, Ivor Costa, MD   enoxaparin (LOVENOX) injection 40 mg, 40 mg, Subcutaneous, Q24H, Ivor Costa, MD   estradiol (ESTRACE) tablet 1 mg, 1 mg, Oral, QPM, Ivor Costa, MD   hydrALAZINE (APRESOLINE) injection 5 mg, 5 mg, Intravenous, Q2H PRN, Ivor Costa, MD   LORazepam (ATIVAN) tablet 0.5 mg, 0.5 mg, Oral, Q8H PRN, Ivor Costa, MD    ondansetron [facility]) injection 4 mg, 4 mg, Intravenous, Q8H PRN, Ivor Costa, MD   potassium chloride 10 mEq in 100 mL IVPB, 10 mEq, Intravenous, Q1 Hr x 2, Ivor Costa, MD, Last Rate: 100 mL/hr at 12/12/22 1829, Infusion Verify at 12/12/22 1829   potassium chloride SA (KLOR-CON M) CR tablet 40 mEq, 40 mEq, Oral, Once, Ivor Costa, MD   senna-docusate (Senokot-S)  tablet 1 tablet, 1 tablet, Oral, QHS PRN, Ivor Costa, MD   zolpidem (AMBIEN) tablet 10 mg, 10 mg, Oral, QHS PRN, Ivor Costa, MD  Vitals   Vitals:   12/12/22 1530 12/12/22 1600 12/12/22 1800 12/12/22 1902  BP: (!) 168/92 (!) 175/106 (!) 171/87 (!) 150/106  Pulse: 93 95 98 (!) 102  Resp: 18 (!) '23 18 18  '$ Temp:   98.2 F (36.8 C) 97.6 F (36.4 C)  TempSrc:   Oral   SpO2: 100% 100% 100% 97%  Weight:      Height:         Body mass index is 22.89 kg/m.  Physical Exam   Physical Exam Gen: A&O x4, NAD HEENT: Atraumatic, normocephalic;mucous membranes moist; oropharynx clear, tongue without atrophy or fasciculations. Neck: Supple, trachea midline. Resp: CTAB, no w/r/r CV: RRR, no m/g/r; nml S1 and S2. 2+ symmetric peripheral pulses. Abd: soft/NT/ND; nabs x 4 quad Extrem: Nml bulk; no cyanosis, clubbing, or edema.  Neuro: *MS: A&O x4. Follows multi-step commands.  *Speech: fluid, nondysarthric, able to name and repeat *CN:    I: Deferred   II,III: PERRLA, VFF by confrontation, optic discs unable to be visualized 2/2 pupillary constriction   III,IV,VI: EOMI w/o nystagmus, no ptosis   V: Sensation intact from V1 to V3 to LT   VII: Eyelid closure was full.  Smile symmetric.   VIII: Hearing intact to voice   IX,X: Voice normal, palate elevates symmetrically    XI: SCM/trap 5/5 bilat   XII: Tongue protrudes midline, no atrophy or fasciculations   *Motor:   Normal bulk.  No tremor, rigidity or bradykinesia. No pronator drift.    Strength: Dlt Bic Tri WrE WrF FgS Gr HF KnF KnE PlF DoF    Left '5 5 5 5 5 5 5 5 5 5 5 5     '$ Right '5 5 5 5 5 5 5 5 5 5 5 5    '$ *Sensory: Intact to light touch, pinprick, temperature vibration throughout. Symmetric. Propioception intact bilat.  No double-simultaneous extinction.  *Coordination:  Finger-to-nose, heel-to-shin, rapid alternating motions were intact. *Reflexes:  2+ and symmetric throughout without clonus; toes down-going bilat *Gait: patient is able to walk normally without assistance but feels subjectively more unsteady than usual.   NIHSS = 0   Premorbid mRS = 0   Labs   CBC:  Recent Labs  Lab 12/12/22 1438  WBC 6.6  NEUTROABS 4.1  HGB 13.4  HCT 40.2  MCV 86.5  PLT 0000000    Basic Metabolic Panel:  Lab Results  Component Value Date   NA 135 12/12/2022   K 2.9 (L) 12/12/2022   CO2 27 12/12/2022   GLUCOSE 100 (H) 12/12/2022   BUN 15 12/12/2022   CREATININE 0.90 12/12/2022   CALCIUM 9.1 12/12/2022   GFRNONAA >60 12/12/2022   GFRAA 75 09/11/2019   Lipid Panel:  Lab Results  Component Value Date   LDLCALC 121 (H) 11/12/2021   HgbA1c:  Lab Results  Component Value Date   HGBA1C 5.2 07/15/2020   Urine Drug Screen:     Component Value Date/Time   LABOPIA NONE DETECTED 12/12/2022 1438   COCAINSCRNUR NONE DETECTED 12/12/2022 1438   LABBENZ NONE DETECTED 12/12/2022 1438   AMPHETMU NONE DETECTED 12/12/2022 1438   THCU NONE DETECTED 12/12/2022 1438   LABBARB NONE DETECTED 12/12/2022 1438    Alcohol Level     Component Value Date/Time   ETH <10 12/12/2022 1438   CT head -  no acute abnormalities, personal review  Impression   This is a 57 yo woman with pmhx HTN, melanoma who presents after transient episode of L sided weakness, word-finding difficulty, and slurred speech at 1245 today which lasted approx 30 min. On my examination deficits had resolved and NIHSS = 0. CT head no acute abnl, personal review. TNK not administered 2/2 resolution of sx.   Recommendations   - Recommend close monitoring in ED with vital signs and NIHSS both q 30  min until outside of the TNK window at 5:15pm.  If neurologic exam is worsens a stroke code should be immediately reactivated.  If his exam is stable or improved at 5:15pm patient should at that point be admitted to the hospitalist service for stroke/TIA work-up. - Permissive HTN x48 hrs from sx onset or until stroke ruled out by MRI goal BP <220/110. PRN labetalol or hydralazine if BP above these parameters. Avoid oral antihypertensives. - MRI brain wo contrast - CTA or MRA H&N - TTE w/ bubble - Check A1c and LDL + add statin per guidelines - ASA '81mg'$  daily + plavix '75mg'$  daily x21 days f/b ASA '81mg'$  daily monotherapy after that - q4 hr neuro checks - STAT head CT for any change in neuro exam - Tele - PT/OT/SLP - Stroke education - Amb referral to neurology upon discharge   Neurology will continue to follow ______________________________________________________________________   Thank you for the opportunity to take part in the care of this patient. If you have any further questions, please contact the neurology consultation attending.  Signed,  Su Monks, MD [facility] [phone]  If 7pm- 7am, please page neurology on call as listed in [place].  **Any copied and pasted documentation in this note was written by me in another application not billed for and pasted by me into this document.  Addendum 1710: EDP called to report patient experienced some mild subjective tingling in L face. She has no objective deficits on his exam. Sx are too mild to consider thrombolytics and she would be outside the window by the time telestroke were activated. Recommend admission to hospitalist service for stroke workup.  Su Monks, MD [facility] [phone]  If 7pm- 7am, please page neurology on call as listed in [place].

## 2022-12-13 ENCOUNTER — Observation Stay (HOSPITAL_BASED_OUTPATIENT_CLINIC_OR_DEPARTMENT_OTHER)
Admit: 2022-12-13 | Discharge: 2022-12-13 | Disposition: A | Discharge status: Home / Self Care | Payer: Commercial Managed Care - PPO | Attending: Internal Medicine | Admitting: Internal Medicine

## 2022-12-13 DIAGNOSIS — R2990 Unspecified symptoms and signs involving the nervous system: Secondary | ICD-10-CM | POA: Diagnosis not present

## 2022-12-13 DIAGNOSIS — G459 Transient cerebral ischemic attack, unspecified: Secondary | ICD-10-CM | POA: Diagnosis not present

## 2022-12-13 DIAGNOSIS — E785 Hyperlipidemia, unspecified: Secondary | ICD-10-CM

## 2022-12-13 DIAGNOSIS — R531 Weakness: Secondary | ICD-10-CM | POA: Diagnosis not present

## 2022-12-13 DIAGNOSIS — I10 Essential (primary) hypertension: Secondary | ICD-10-CM

## 2022-12-13 DIAGNOSIS — R4789 Other speech disturbances: Secondary | ICD-10-CM | POA: Diagnosis not present

## 2022-12-13 DIAGNOSIS — R4781 Slurred speech: Secondary | ICD-10-CM

## 2022-12-13 LAB — BASIC METABOLIC PANEL
Anion gap: 5 (ref 5–15)
BUN: 12 mg/dL (ref 6–20)
CO2: 27 mmol/L (ref 22–32)
Calcium: 8.7 mg/dL — ABNORMAL LOW (ref 8.9–10.3)
Chloride: 102 mmol/L (ref 98–111)
Creatinine, Ser: 0.88 mg/dL (ref 0.44–1.00)
GFR, Estimated: >60 mL/min (ref >60)
Glucose, Bld: 95 mg/dL (ref 70–99)
Potassium: 3.5 mmol/L (ref 3.5–5.1)
Sodium: 134 mmol/L — ABNORMAL LOW (ref 135–145)

## 2022-12-13 LAB — LIPID PANEL
Cholesterol: 268 mg/dL — ABNORMAL HIGH (ref 0–200)
HDL: 53 mg/dL (ref >40)
LDL Cholesterol: UNDETERMINED mg/dL (ref 0–99)
Total CHOL/HDL Ratio: 5.1 RATIO
Triglycerides: 432 mg/dL — ABNORMAL HIGH (ref <150)
VLDL: UNDETERMINED mg/dL (ref 0–40)

## 2022-12-13 LAB — ECHOCARDIOGRAM COMPLETE
AR max vel: 2.41 cm2
AV Area VTI: 2.67 cm2
AV Area mean vel: 2.37 cm2
AV Mean grad: 2 mmHg
AV Peak grad: 3 mmHg
Ao pk vel: 0.87 m/s
Area-P 1/2: 5.06 cm2
Height: 64 in
S' Lateral: 2.3 cm
Weight: 2134.05 oz

## 2022-12-13 LAB — LDL CHOLESTEROL, DIRECT: Direct LDL: 162 mg/dL — ABNORMAL HIGH (ref 0–99)

## 2022-12-13 LAB — VITAMIN B12: Vitamin B-12: 214 pg/mL (ref 180–914)

## 2022-12-13 LAB — HIV ANTIBODY (ROUTINE TESTING W REFLEX): HIV Screen 4th Generation wRfx: NONREACTIVE

## 2022-12-13 LAB — TSH: TSH: 1.151 u[IU]/mL (ref 0.350–4.500)

## 2022-12-13 LAB — VITAMIN D 25 HYDROXY (VIT D DEFICIENCY, FRACTURES): Vit D, 25-Hydroxy: 23.19 ng/mL — ABNORMAL LOW (ref 30–100)

## 2022-12-13 MED ORDER — CLOPIDOGREL BISULFATE 75 MG PO TABS
225.0000 mg | ORAL_TABLET | Freq: Every day | ORAL | Status: DC
  Administered 2022-12-13: 225 mg via ORAL
  Filled 2022-12-13: qty 3

## 2022-12-13 MED ORDER — METOPROLOL TARTRATE 25 MG PO TABS
25.0000 mg | ORAL_TABLET | Freq: Two times a day (BID) | ORAL | Status: DC
  Administered 2022-12-13: 25 mg via ORAL
  Filled 2022-12-13: qty 1

## 2022-12-13 MED ORDER — VITAMIN D (ERGOCALCIFEROL) 1.25 MG (50000 UNIT) PO CAPS
50000.0000 [IU] | ORAL_CAPSULE | ORAL | Status: DC
  Administered 2022-12-13: 50000 [IU] via ORAL
  Filled 2022-12-13: qty 1

## 2022-12-13 MED ORDER — ASPIRIN 81 MG PO TBEC
81.0000 mg | DELAYED_RELEASE_TABLET | Freq: Every day | ORAL | Status: DC
  Administered 2022-12-14: 81 mg via ORAL
  Filled 2022-12-13: qty 1

## 2022-12-13 MED ORDER — VITAMIN B-12 1000 MCG PO TABS: 1000.0000 ug | ORAL_TABLET | Freq: Every day | ORAL | Status: DC

## 2022-12-13 MED ORDER — FENOFIBRATE 54 MG PO TABS
54.0000 mg | ORAL_TABLET | Freq: Every day | ORAL | Status: DC
  Administered 2022-12-13 – 2022-12-14 (×2): 54 mg via ORAL
  Filled 2022-12-13 (×2): qty 1

## 2022-12-13 MED ORDER — ATORVASTATIN CALCIUM 80 MG PO TABS
80.0000 mg | ORAL_TABLET | Freq: Every day | ORAL | Status: DC
  Administered 2022-12-14: 80 mg via ORAL
  Filled 2022-12-13: qty 1

## 2022-12-13 MED ORDER — CYANOCOBALAMIN 1000 MCG/ML IJ SOLN
1000.0000 ug | Freq: Every day | INTRAMUSCULAR | Status: DC
  Administered 2022-12-14: 1000 ug via INTRAMUSCULAR
  Filled 2022-12-13 (×2): qty 1

## 2022-12-13 NOTE — Progress Notes (Signed)
*  PRELIMINARY RESULTS* Echocardiogram 2D Echocardiogram has been performed.  Jill Porter 12/13/2022, 8:55 AM

## 2022-12-13 NOTE — Evaluation (Signed)
Physical Therapy Evaluation Patient Details Name: Jill Porter MRN: [id number] DOB: 1966/07/20 Today's Date: 12/13/2022  History of Present Illness  Jill Porter is a 43yoF who comes to [facility] on 12/12/22 with concerns of Left sided numbness, dizziness, and slurred speech. Episode reseolved while here then similar repeat episode. PMH: HTN, HLD, GERD, depression, GAD,  melanoma.  Clinical Impression  Pt in bed, husband at bedside, pt reports resolution of anomia, resolution of dizziness issue, still has some paresthesias in Left hand/wrist, left face, left lower leg. Pt performs all basic mobility in session independently and with confidence. Pt demonstrates narrow stance eyes closed, turning in a circle bilat, SLS c low sway, AMB in 4 directions, all without LOB, less than typical sway, good confidence. Aside from paresthesia, pt reports to be at baseline. No acute skilled PT services indicated at this time. PT signing off.      Recommendations for follow up therapy are one component of a multi-disciplinary discharge planning process, led by the attending physician.  Recommendations may be updated based on patient status, additional functional criteria and insurance authorization.  Follow Up Recommendations No PT follow up      Assistance Recommended at Discharge None  Patient can return home with the following       Equipment Recommendations None recommended by PT  Recommendations for Other Services       Functional Status Assessment Patient has not had a recent decline in their functional status     Precautions / Restrictions Precautions Precautions: None Restrictions Weight Bearing Restrictions: No      Mobility  Bed Mobility Overal bed mobility: Independent                  Transfers Overall transfer level: Independent                      Ambulation/Gait Ambulation/Gait assistance: Independent Gait Distance (Feet): 400 Feet Assistive device:  None Gait Pattern/deviations: WFL(Within Functional Limits) Gait velocity: 1.49ms        Stairs            Wheelchair Mobility    Modified Rankin (Stroke Patients Only)       Balance Overall balance assessment: Independent                                           Pertinent Vitals/Pain Pain Assessment Pain Assessment: No/denies pain    Home Living Family/patient expects to be discharged to:: Private residence Living Arrangements: Spouse/significant other Available Help at Discharge: Family   Home Access: Stairs to enter   ETechnical brewerof Steps: 2-3 Alternate Level Stairs-Number of Steps: 2nd floor bonus room Home Layout: Multi-level Home Equipment: None      Prior Function Prior Level of Function : Independent/Modified Independent             Mobility Comments: no limitations, helps with transporting grandkids for errands, sports, appointments       Hand Dominance        Extremity/Trunk Assessment   Upper Extremity Assessment Upper Extremity Assessment: Overall WFL for tasks assessed    Lower Extremity Assessment Lower Extremity Assessment: Overall WFL for tasks assessed       Communication      Cognition Arousal/Alertness: Awake/alert Behavior During Therapy: WFL for tasks assessed/performed Overall Cognitive Status: Within Functional Limits for tasks assessed  General Comments      Exercises     Assessment/Plan    PT Assessment Patient does not need any further PT services  PT Problem List Impaired sensation       PT Treatment Interventions      PT Goals (Current goals can be found in the Care Plan section)  Acute Rehab PT Goals PT Goal Formulation: All assessment and education complete, DC therapy    Frequency       Co-evaluation               AM-PAC PT "6 Clicks" Mobility  Outcome Measure Help needed turning from your  back to your side while in a flat bed without using bedrails?: None Help needed moving from lying on your back to sitting on the side of a flat bed without using bedrails?: None Help needed moving to and from a bed to a chair (including a wheelchair)?: None Help needed standing up from a chair using your arms (e.g., wheelchair or bedside chair)?: None Help needed to walk in hospital room?: None Help needed climbing 3-5 steps with a railing? : None 6 Click Score: 24    End of Session Equipment Utilized During Treatment: Gait belt Activity Tolerance: Patient tolerated treatment well;No increased pain Patient left: in bed;with call bell/phone within reach;with family/visitor present   PT Visit Diagnosis: Other symptoms and signs involving the nervous system (R29.898)    Time: [id number] PT Time Calculation (min) (ACUTE ONLY): 14 min   Charges:   PT Evaluation $PT Eval Low Complexity: 1 Low         10:27 AM, 12/13/22 Etta Grandchild, PT, DPT Physical Therapist - [facility]  [phone] ([place])    Jessen Siegman C 12/13/2022, 10:23 AM

## 2022-12-13 NOTE — Plan of Care (Signed)

## 2022-12-13 NOTE — Progress Notes (Signed)
Neurology progress note  S: Patient's sx recurred last night and again resolved. No focal neurologic deficits at this time.  Stroke workup this admission:  MRI brain - no acute infarct on personal review  CTA H&N - no hemodynamically significant stenoses personal review  TTE - no intracardiac clot or other etiology found to explain sx, bubble neg  Stroke Labs     Component Value Date/Time   CHOL 268 (H) 12/13/2022 0545   CHOL 211 (H) 11/12/2021 0749   TRIG 432 (H) 12/13/2022 0545   HDL 53 12/13/2022 0545   HDL 66 11/12/2021 0749   CHOLHDL 5.1 12/13/2022 0545   VLDL UNABLE TO CALCULATE IF TRIGLYCERIDE OVER 400 mg/dL 12/13/2022 0545   LDLCALC UNABLE TO CALCULATE IF TRIGLYCERIDE OVER 400 mg/dL 12/13/2022 0545   LDLCALC 121 (H) 11/12/2021 0749   LDLDIRECT 162 (H) 12/13/2022 0545   LABVLDL 24 11/12/2021 0749    Lab Results  Component Value Date/Time   HGBA1C 5.2 07/15/2020 08:50 AM   O:  Vitals:   12/13/22 1206 12/13/22 1649  BP: (!) 153/83 (!) 148/78  Pulse: 72 82  Resp: 18 16  Temp: 98.4 F (36.9 C) 98.3 F (36.8 C)  SpO2: 100% 100%    Physical Exam Gen: A&Ox4, NAD HEENT: Atraumatic, normocephalic; oropharynx clear, tongue without atrophy or fasciculations. Resp: CTAB, normal work of breathing CV: RRR, extremities appear well-perfused. Abd: soft/NT/ND Extrem: Nml bulk; no cyanosis, clubbing, or edema.  Neuro: *MS: A&O x4. Follows multi-step commands.  *Speech: no dysarthria or aphasia, able to name and repeat. *CN:    I: Deferred   II,III: PERRLA, VFF by confrontation, optic discs not visualized 2/2 pupillary constriction   III,IV,VI: EOMI w/o nystagmus, no ptosis   V: Sensation intact from V1 to V3 to LT   VII: Eyelid closure was full.  Smile symmetric.   VIII: Hearing intact to voice   IX,X: Voice normal, palate elevates symmetrically    XI: SCM/trap 5/5 bilat   XII: Tongue protrudes midline, no atrophy or fasciculations  *Motor:   Normal bulk.  No  tremor, rigidity or bradykinesia. No pronator drift.   Strength: Dlt Bic Tri WE WrF FgS Gr HF KnF KnE PlF DoF    Left '5 5 5 5 5 5 5 5 5 5 5 5    '$ Right '5 5 5 5 5 5 5 5 5 5 5 5   '$ *Sensory: Intact to light touch, pinprick, temperature vibration throughout. Symmetric. Propioception intact bilat.  No double-simultaneous extinction.  *Coordination:  Finger-to-nose, heel-to-shin, rapid alternating motions were intact. *Reflexes:  2+ and symmetric throughout without clonus; toes down-going bilat *Gait: deferred  NIHSS = 0  A/P: This is a 57 yo woman with pmhx HTN, melanoma who presented after transient episode of L sided weakness, word-finding difficulty, and slurred speech which resolved after 30 min. She has had 2 instances of similar recurrent sx since admission after which MRI brain showed no acute infarct. Query capsular or pontine warning syndrome vs small MRI-negative stroke.  - Due to fluctuating sx recommend further 24 hrs observation - Repeat MRI brain w/ thin cuts through brainstem and capsular regions. This time will do with and without contrast 2/2 hx melanoma - Routine EEG - Permissive HTN x48 hrs from sx onset goal BP <220/110. PRN labetalol or hydralazine if BP above these parameters. Avoid oral antihypertensives. - Atrovastatin '80mg'$  daily - ASA '81mg'$  daily + plavix '75mg'$  daily x21 days f/b ASA '81mg'$  daily monotherapy after that -  q4 hr neuro checks - STAT head CT for any change in neuro exam - Tele - PT/OT/SLP - Stroke education - Amb referral to neurology upon discharge - Will continue to follow  Su Monks, MD [facility] [phone]  If 7pm- 7am, please page neurology on call as listed in [place].

## 2022-12-13 NOTE — Progress Notes (Signed)
SLP Cancellation Note  Patient Details Name: Jill Porter MRN: [id number] DOB: 05-26-1966   Cancelled treatment:       Reason Eval/Treat Not Completed: SLP screened, no needs identified, will sign off (chart reviewed; consulted NSG and met w/ pt win room)  Pt denied any difficulty swallowing and is currently on a regular diet; tolerates swallowing pills w/ water per NSG.  Pt conversed in conversation w/out expressive/receptive deficits noted; pt denied any speech-language deficits currently stating issues resolved "after last night". Speech clear, intelligible. Discussed her s/s last night, their resolution, and f/u w/ Neurology for continued care.  No further skilled ST services indicated as pt appears at her baseline. Pt agreed. NSG to reconsult if any change in status while admitted.      Orinda Kenner, MS, CCC-SLP Speech Language Pathologist Rehab Services; [place] [phone] (ascom) Merleen Picazo 12/13/2022, 12:33 PM

## 2022-12-13 NOTE — Progress Notes (Signed)
[facility] Progress Note  Patient: Jill Porter    [id number]  DOA: 12/12/2022     Date of Service: the patient was seen and examined on 12/13/2022  Chief Complaint  Patient presents with   Weakness   Brief hospital course: Jill Porter is a 57 y.o. female with medical history significant of hypertension, hyperlipidemia, GERD, depression with anxiety, melanoma, who presents with left-sided weakness, slurred speech and dizziness.   Patient states that her symptoms started at about 1245 today.  She had slurred speech, left facial droop, left-sided weakness and numbness.  She also reports dizziness and poor balance.  No vision loss or hearing loss.  Denies chest pain, cough, shortness breath.  No nausea, vomiting, diarrhea or abdominal pain.  No symptoms of UTI.  Her slurred speech and left facial droop have resolved.  She still feels heavy in both left arm and left leg.     Data reviewed independently and ED Course: pt was found to have WBC 6.6, INR 0.9, PTT 27, negative UDS, potassium 2.9, temperature 97.5, blood pressure 175/106, heart rate 103, 95, RR 23, oxygen saturation 100% on room air.  CT of head is negative for acute intracranial abnormalities.  Patient is placed on telemetry bed for observation.  Dr. Quinn Axe of neurology is consulted. EKG: Sinus rhythm, QTc 453, low voltage.    Assessment and Plan:  # TIA, patient presented with left-sided numbness which is off and on, multiple episodes. Continue aspirin 81 mg p.o. daily and Plavix 300 mg x 1 dose followed by 75 mg p.o. daily CT head negative for acute stroke MRI negative for any acute findings CTA head and neck ruled out LVO TTE shows LVEF 60-65%, mild LV hypertrophy , grade 1 diastolic dysfunction, negative PFO Allow permissive hypertension as per neurology Continue neurochecks as per protocol Continue telemetry monitor  # Hypertension Allow permissive hypertension due to possibility of stroke and TIA as per  neurology Home medication losartan and hydrochlorothiazide held for now We will continue to monitor BP and restart medications when cleared by neurology.   # Hypertriglyceridemia, Started Lipitor 80 mg p.o. daily and fenofibrate 45 mg p.o. daily  # Hypokalemia, potassium repleted. Monitor electrolytes and replete as needed.  # Vitamin B12 level 214, goal >400, started vitamin B12 1000 mcg IM injection daily during hospital stay followed by oral supplement.   # Vitamin D insufficiency, started vitamin D 55 units p.o. weekly.  Follow with PCP to repeat vitamin D level after 3 to 6 months   Body mass index is 22.89 kg/m.  Interventions:       Diet: Heart healthy diet DVT Prophylaxis: Subcutaneous Lovenox   Advance goals of care discussion: Full code  Family Communication: family was present at bedside, at the time of interview.  The pt provided permission to discuss medical plan with the family. Opportunity was given to ask question and all questions were answered satisfactorily.   Disposition:  Pt is from Home, admitted with TIA,, still has left-sided numbness, which precludes a safe discharge. Discharge to home, when clinically stable, most likely tomorrow a.m. if cleared by neurology..  Subjective: No significant events overnight, in the morning time patient started having left-sided numbness again, no slurred speech, no any focal weakness.  Denies any headache or dizziness no new visual problems.  Denies any chest pain or palpitation, no shortness of breath.  Physical Exam: General: NAD, lying comfortably Appear in no distress, affect appropriate Eyes: PERRLA ENT: Oral Mucosa Clear,  moist  Neck: no JVD,  Cardiovascular: S1 and S2 Present, no Murmur,  Respiratory: good respiratory effort, Bilateral Air entry equal and Decreased, no Crackles, no wheezes Abdomen: Bowel Sound present, Soft and no tenderness,  Skin: no rashes Extremities: no Pedal edema, no calf  tenderness Neurologic: Mild numbness on the face, numbness on the left arm and left leg below knee.  No any other focal deficits Gait not checked due to patient safety concerns  Vitals:   12/13/22 0430 12/13/22 0808 12/13/22 1019 12/13/22 1206  BP: (!) 155/86 (!) 154/89  (!) 153/83  Pulse: 83 85 (!) 114 72  Resp: 18   18  Temp: 97.6 F (36.4 C) 97.8 F (36.6 C)  98.4 F (36.9 C)  TempSrc: Oral Oral  Oral  SpO2: 99% 98%  100%  Weight:      Height:        Intake/Output Summary (Last 24 hours) at 12/13/2022 1405 Last data filed at 12/12/2022 1829 Gross per 24 hour  Intake 13.21 ml  Output --  Net 13.21 ml   Filed Weights   12/12/22 1417 12/12/22 1418  Weight: 59.9 kg 60.5 kg    Data Reviewed: I have personally reviewed and interpreted daily labs, tele strips, imagings as discussed above. I reviewed all nursing notes, pharmacy notes, vitals, pertinent old records I have discussed plan of care as described above with RN and patient/family.  CBC: Recent Labs  Lab 12/12/22 1438  WBC 6.6  NEUTROABS 4.1  HGB 13.4  HCT 40.2  MCV 86.5  PLT 0000000   Basic Metabolic Panel: Recent Labs  Lab 12/12/22 1438 12/12/22 1815 12/13/22 0545  NA 135  --  134*  K 2.9*  --  3.5  CL 100  --  102  CO2 27  --  27  GLUCOSE 100*  --  95  BUN 15  --  12  CREATININE 0.90  --  0.88  CALCIUM 9.1  --  8.7*  MG  --  2.3  --   PHOS  --  3.7  --     Studies: ECHOCARDIOGRAM COMPLETE  Result Date: 12/13/2022    ECHOCARDIOGRAM REPORT   Patient Name:   Jill Porter Date of Exam: 12/13/2022 Medical Rec #:  [id number]       Height:       64.0 in Accession #:    [id number]      Weight:       133.4 lb Date of Birth:  [birth year]       BSA:          1.647 m Patient Age:    16 years        BP:           155/86 mmHg Patient Gender: F               HR:           83 bpm. Exam Location:  [facility] Procedure: 2D Echo, Cardiac Doppler, Limited Color Doppler and Saline Contrast            Bubble Study Indications:      Stroke I63.9  History:         Patient has prior history of Echocardiogram examinations, most                  recent 11/30/2016. Risk Factors:Hypertension.  Sonographer:     Sherrie Sport Referring Phys:  Menard Diagnosing Phys: Nelva Bush MD  Sonographer Comments: Image quality was good. IMPRESSIONS  1. Left ventricular ejection fraction, by estimation, is 60 to 65%. The left ventricle has normal function. The left ventricle has no regional wall motion abnormalities. There is mild left ventricular hypertrophy. Left ventricular diastolic parameters are consistent with Grade I diastolic dysfunction (impaired relaxation).  2. Right ventricular systolic function is normal. The right ventricular size is normal. Tricuspid regurgitation signal is inadequate for assessing PA pressure.  3. The mitral valve is normal in structure. No evidence of mitral valve regurgitation. No evidence of mitral stenosis.  4. The aortic valve is tricuspid. There is mild thickening of the aortic valve. Aortic valve regurgitation is not visualized. Aortic valve sclerosis is present, with no evidence of aortic valve stenosis.  5. The inferior vena cava is normal in size with greater than 50% respiratory variability, suggesting right atrial pressure of 3 mmHg.  6. Agitated saline contrast bubble study was negative, with no evidence of any interatrial shunt. FINDINGS  Left Ventricle: Left ventricular ejection fraction, by estimation, is 60 to 65%. The left ventricle has normal function. The left ventricle has no regional wall motion abnormalities. The left ventricular internal cavity size was normal in size. There is  mild left ventricular hypertrophy. Left ventricular diastolic parameters are consistent with Grade I diastolic dysfunction (impaired relaxation). Right Ventricle: The right ventricular size is normal. No increase in right ventricular wall thickness. Right ventricular systolic function is normal. Tricuspid regurgitation  signal is inadequate for assessing PA pressure. Left Atrium: Left atrial size was normal in size. Right Atrium: Right atrial size was normal in size. Pericardium: There is no evidence of pericardial effusion. Mitral Valve: The mitral valve is normal in structure. No evidence of mitral valve regurgitation. No evidence of mitral valve stenosis. Tricuspid Valve: The tricuspid valve is normal in structure. Tricuspid valve regurgitation is trivial. Aortic Valve: The aortic valve is tricuspid. There is mild thickening of the aortic valve. Aortic valve regurgitation is not visualized. Aortic valve sclerosis is present, with no evidence of aortic valve stenosis. Aortic valve mean gradient measures 2.0  mmHg. Aortic valve peak gradient measures 3.0 mmHg. Aortic valve area, by VTI measures 2.67 cm. Pulmonic Valve: The pulmonic valve was normal in structure. Pulmonic valve regurgitation is not visualized. No evidence of pulmonic stenosis. Aorta: The aortic root is normal in size and structure. Pulmonary Artery: The pulmonary artery is of normal size. Venous: The inferior vena cava is normal in size with greater than 50% respiratory variability, suggesting right atrial pressure of 3 mmHg. IAS/Shunts: No atrial level shunt detected by color flow Doppler. Agitated saline contrast was given intravenously to evaluate for intracardiac shunting. Agitated saline contrast bubble study was negative, with no evidence of any interatrial shunt.  LEFT VENTRICLE PLAX 2D LVIDd:         3.70 cm   Diastology LVIDs:         2.30 cm   LV e' medial:    5.00 cm/s LV PW:         1.10 cm   LV E/e' medial:  10.9 LV IVS:        1.00 cm   LV e' lateral:   7.72 cm/s LVOT diam:     2.00 cm   LV E/e' lateral: 7.1 LV SV:         43 LV SV Index:   26 LVOT Area:     3.14 cm  RIGHT VENTRICLE RV Basal diam:  1.60 cm RV Mid  diam:    1.50 cm LEFT ATRIUM             Index        RIGHT ATRIUM          Index LA diam:        2.70 cm 1.64 cm/m   RA Area:     4.95  cm LA Vol (A2C):   16.5 ml 10.02 ml/m  RA Volume:   5.83 ml  3.54 ml/m LA Vol (A4C):   26.1 ml 15.85 ml/m LA Biplane Vol: 20.9 ml 12.69 ml/m  AORTIC VALVE AV Area (Vmax):    2.41 cm AV Area (Vmean):   2.37 cm AV Area (VTI):     2.67 cm AV Vmax:           86.50 cm/s AV Vmean:          62.700 cm/s AV VTI:            0.160 m AV Peak Grad:      3.0 mmHg AV Mean Grad:      2.0 mmHg LVOT Vmax:         66.30 cm/s LVOT Vmean:        47.400 cm/s LVOT VTI:          0.136 m LVOT/AV VTI ratio: 0.85  AORTA Ao Root diam: 2.60 cm MITRAL VALVE MV Area (PHT): 5.06 cm    SHUNTS MV Decel Time: 150 msec    Systemic VTI:  0.14 m MV E velocity: 54.50 cm/s  Systemic Diam: 2.00 cm MV A velocity: 94.30 cm/s MV E/A ratio:  0.58 Harrell Gave End MD Electronically signed by Nelva Bush MD Signature Date/Time: 12/13/2022/12:23:03 PM    Final    MR BRAIN WO CONTRAST  Result Date: 12/12/2022 CLINICAL DATA:  Facial droop and slurred speech, left-sided paresthesias EXAM: MRI HEAD WITHOUT CONTRAST TECHNIQUE: Multiplanar, multiecho pulse sequences of the brain and surrounding structures were obtained without intravenous contrast. COMPARISON:  06/05/2018 MRI head, correlation is also made with CT head 12/12/2022 FINDINGS: Brain: No restricted diffusion to suggest acute or subacute infarct. No acute hemorrhage, mass, mass effect, or midline shift. No hydrocephalus or extra-axial collection. Normal pituitary and craniocervical junction. No hemosiderin deposition to suggest remote hemorrhage. Cerebral volume is within normal limits for age. Vascular: Normal arterial flow voids. Skull and upper cervical spine: Normal marrow signal. Sinuses/Orbits: Clear paranasal sinuses. No acute finding in the orbits. Other: Fluid in the right mastoid air cells. IMPRESSION: No acute intracranial process. No evidence of acute or subacute infarct. Electronically Signed   By: Merilyn Baba M.D.   On: 12/12/2022 22:41   CT ANGIO HEAD NECK W WO CM  Result  Date: 12/12/2022 CLINICAL DATA:  Left-sided weakness, slurred speech, stroke suspected EXAM: CT ANGIOGRAPHY HEAD AND NECK TECHNIQUE: Multidetector CT imaging of the head and neck was performed using the standard protocol during bolus administration of intravenous contrast. Multiplanar CT image reconstructions and MIPs were obtained to evaluate the vascular anatomy. Carotid stenosis measurements (when applicable) are obtained utilizing NASCET criteria, using the distal internal carotid diameter as the denominator. RADIATION DOSE REDUCTION: This exam was performed according to the departmental dose-optimization program which includes automated exposure control, adjustment of the mA and/or kV according to patient size and/or use of iterative reconstruction technique. CONTRAST:  35m OMNIPAQUE IOHEXOL 350 MG/ML SOLN COMPARISON:  No prior CTA available, correlation is made with CT head 12/12/2022 FINDINGS: CT HEAD FINDINGS For noncontrast findings, please see same  day CT head. CTA NECK FINDINGS Aortic arch: Standard branching. Imaged portion shows no evidence of aneurysm or dissection. No significant stenosis of the major arch vessel origins. Right carotid system: No evidence of stenosis, dissection, or occlusion. Left carotid system: No evidence of stenosis, dissection, or occlusion. Vertebral arteries: No evidence of stenosis, dissection, or occlusion. Skeleton: No acute osseous abnormality. Degenerative changes in the cervical spine. Other neck: 6 mm hypoenhancing lesion in the left thyroid lobe, for which no follow-up is currently indicated. (Reference: J Am Coll Radiol. 2015 Feb;12(2): 143-50) Upper chest: No focal pulmonary opacity or pleural effusion. Review of the MIP images confirms the above findings CTA HEAD FINDINGS Anterior circulation: Both internal carotid arteries are patent to the termini, without significant stenosis. A1 segments patent. Normal anterior communicating artery. Anterior cerebral arteries  are patent to their distal aspects. No M1 stenosis or occlusion. MCA branches perfused and symmetric. Posterior circulation: Vertebral arteries patent to the vertebrobasilar junction without stenosis. Basilar patent to its distal aspect. Superior cerebellar arteries patent proximally. Patent P1 segments. PCAs perfused to their distal aspects without stenosis. The bilateral posterior communicating arteries are not visualized. Venous sinuses: As permitted by contrast timing, patent. Anatomic variants: None significant. Review of the MIP images confirms the above findings IMPRESSION: 1. No intracranial large vessel occlusion or significant stenosis. 2. No hemodynamically significant stenosis in the neck. Electronically Signed   By: Merilyn Baba M.D.   On: 12/12/2022 20:56   CT HEAD CODE STROKE WO CONTRAST  Result Date: 12/12/2022 CLINICAL DATA:  Code stroke. Neuro deficit, acute, stroke suspected. Dizziness. Left-sided weakness. Slurred speech. EXAM: CT HEAD WITHOUT CONTRAST TECHNIQUE: Contiguous axial images were obtained from the base of the skull through the vertex without intravenous contrast. RADIATION DOSE REDUCTION: This exam was performed according to the departmental dose-optimization program which includes automated exposure control, adjustment of the mA and/or kV according to patient size and/or use of iterative reconstruction technique. COMPARISON:  Brain MRI 06/05/2018. Head CT 06/26/2013. FINDINGS: Brain: No age advanced or lobar predominant parenchymal atrophy. There is no acute intracranial hemorrhage. No demarcated cortical infarct. No extra-axial fluid collection. No evidence of an intracranial mass. No midline shift. Vascular: No hyperdense vessel. Atherosclerotic calcifications. Skull: No fracture or aggressive osseous lesion. Sinuses/Orbits: No mass or acute finding within the imaged orbits. No significant paranasal sinus disease at the imaged levels. ASPECTS ([place] Stroke Program Early CT  Score) - Ganglionic level infarction (caudate, lentiform nuclei, internal capsule, insula, M1-M3 cortex): 7 - Supraganglionic infarction (M4-M6 cortex): 3 Total score (0-10 with 10 being normal): 10 These results were communicated to Dr. Quinn Axe At 2:43 pmon 12/12/2022 by text page via the [facility] messaging system. IMPRESSION: No evidence of an acute intracranial abnormality. ASPECTS is 10. Electronically Signed   By: Kellie Simmering D.O.   On: 12/12/2022 14:43    Scheduled Meds:   stroke: early stages of recovery book   Does not apply Once   aspirin  300 mg Rectal Daily   Or   aspirin  325 mg Oral Daily   atorvastatin  40 mg Oral Daily   clopidogrel  225 mg Oral Daily   clopidogrel  75 mg Oral Daily   enoxaparin (LOVENOX) injection  40 mg Subcutaneous Q24H   estradiol  1 mg Oral QPM   fenofibrate  54 mg Oral Daily   Continuous Infusions:  sodium chloride Stopped (12/12/22 1821)   PRN Meds: sodium chloride, acetaminophen **OR** acetaminophen (TYLENOL) oral liquid 160 mg/5 mL **OR**  acetaminophen, hydrALAZINE, LORazepam, ondansetron (ZOFRAN) IV, senna-docusate  Time spent: 35 minutes  Author: Val Riles. MD [facility] 12/13/2022 2:05 PM  To reach On-call, see care teams to locate the attending and reach out to them via [email or web address]. If 7PM-7AM, please contact night-coverage If you still have difficulty reaching the attending provider, please page the [facility] (Director on Call) for [facility] on amion for assistance.

## 2022-12-13 NOTE — Progress Notes (Signed)
Eeg done 

## 2022-12-13 NOTE — Progress Notes (Signed)
OT Cancellation Note  Patient Details Name: Jill Porter MRN: [id number] DOB: 02-Jun-1966   Cancelled Treatment:    Reason Eval/Treat Not Completed: OT screened, no needs identified, will sign off. Order received, chart reviewed. Upon arrival pt out of room for testing, family member reports pt with mild L hand paresthesias however not impacting ADLs at this time. Per PT pt at baseline mobility. Family educated on requesting follow up outpatient OT services if paresthesias interfere with IADLs. No skilled acute OT needs identified. Will sign off at this time.   Dessie Coma, M.S. OTR/L  12/13/22, 3:56 PM  ascom [phone]

## 2022-12-14 ENCOUNTER — Observation Stay: Payer: Commercial Managed Care - PPO

## 2022-12-14 DIAGNOSIS — R4182 Altered mental status, unspecified: Secondary | ICD-10-CM

## 2022-12-14 DIAGNOSIS — G459 Transient cerebral ischemic attack, unspecified: Secondary | ICD-10-CM | POA: Diagnosis not present

## 2022-12-14 LAB — HEMOGLOBIN A1C
Hgb A1c MFr Bld: 5.3 % (ref 4.8–5.6)
Mean Plasma Glucose: 105 mg/dL

## 2022-12-14 MED ORDER — ASPIRIN 81 MG PO TBEC: 81.0000 mg | DELAYED_RELEASE_TABLET | Freq: Every day | ORAL | 3 refills | Status: AC

## 2022-12-14 MED ORDER — HYDRALAZINE HCL 50 MG PO TABS: 50.0000 mg | ORAL_TABLET | Freq: Three times a day (TID) | ORAL | 0 refills | Status: DC | PRN

## 2022-12-14 MED ORDER — CYANOCOBALAMIN 1000 MCG PO TABS: 1000.0000 ug | ORAL_TABLET | Freq: Every day | ORAL | 0 refills | Status: AC

## 2022-12-14 MED ORDER — FENOFIBRATE 54 MG PO TABS: 54.0000 mg | ORAL_TABLET | Freq: Every day | ORAL | 5 refills | Status: DC

## 2022-12-14 MED ORDER — CLOPIDOGREL BISULFATE 75 MG PO TABS: 75.0000 mg | ORAL_TABLET | Freq: Every day | ORAL | 0 refills | Status: AC

## 2022-12-14 MED ORDER — ATORVASTATIN CALCIUM 80 MG PO TABS: 80.0000 mg | ORAL_TABLET | Freq: Every day | ORAL | 5 refills | Status: DC

## 2022-12-14 MED ORDER — GADOBUTROL 1 MMOL/ML IV SOLN
7.5000 mL | Freq: Once | INTRAVENOUS | Status: AC | PRN
  Administered 2022-12-14: 7.5 mL via INTRAVENOUS

## 2022-12-14 MED ORDER — VITAMIN D (ERGOCALCIFEROL) 1.25 MG (50000 UNIT) PO CAPS: 50000.0000 [IU] | ORAL_CAPSULE | ORAL | 0 refills | Status: AC

## 2022-12-14 NOTE — Discharge Summary (Signed)
[facility] Discharge Summary   Patient: Jill Porter [id number]  PCP: Eulas Post, MD  Date of admission: 12/12/2022   Date of discharge:  12/14/2022     Discharge Diagnoses:  Principal Problem:   TIA (transient ischemic attack) Active Problems:   Hypertension   Hyperlipemia   Depression with anxiety   Admitted From: Home Disposition:  Home   Recommendations for Outpatient Follow-up:  Follow-up with PCP in 1 week, repeat B12 and vitamin D level after 3 months. Follow with neurology in 1 to 2 weeks Follow up LABS/TEST:  as above   Diet recommendation: Cardiac diet  Activity: The patient is advised to gradually reintroduce usual activities, as tolerated  Discharge Condition: stable  Code Status: Full code   History of present illness: As per the H and P dictated on admission. Hospital Course:  Jill Porter is a 57 y.o. female with medical history significant of hypertension, hyperlipidemia, GERD, depression with anxiety, melanoma, who presents with left-sided weakness, slurred speech and dizziness. Patient states that her symptoms started at about 1245 today.  She had slurred speech, left facial droop, left-sided weakness and numbness.  She also reports dizziness and poor balance.  No vision loss or hearing loss.  Denies chest pain, cough, shortness breath.  No nausea, vomiting, diarrhea or abdominal pain.  No symptoms of UTI.  Her slurred speech and left facial droop have resolved.  She still feels heavy in both left arm and left leg.  ED Course: pt was found to have WBC 6.6, INR 0.9, PTT 27, negative UDS, potassium 2.9, temperature 97.5, blood pressure 175/106, heart rate 103, 95, RR 23, oxygen saturation 100% on room air.  CT of head is negative for acute intracranial abnormalities.  Patient is placed on telemetry bed for observation.  Dr. Quinn Axe of neurology is consulted. EKG: Sinus rhythm, QTc 453, low voltage.    Assessment and Plan: # TIA, patient  presented with left-sided numbness which is off and on, multiple episodes. Continue aspirin 81 mg p.o. daily and Plavix 300 mg x 1 dose followed by 75 mg p.o. daily. CT head negative for acute stroke MRI negative for any acute findings. CTA head and neck ruled out LVO TTE shows LVEF 60-65%, mild LV hypertrophy , grade 1 diastolic dysfunction, negative PFO. S/p permissive hypertension as per neurology,  neurochecks as per protocol and  telemetry monitor, no significant events during hospital stay.  MRI brain with and without contrast was repeated which was negative for any acute findings.  EEG negative.  Patient was cleared by neurology to discharge on dual antiplatelet therapy for 21 days followed by aspirin 81 mg p.o. daily.  Lipitor 80 mg p.o. daily. # Hypertension, s/p permissive hypertension due to possibility of stroke and TIA as per neurology. Home medication losartan and HCTZ held during hospital stay.  Resumed losartan home dose on discharge, discontinued HCTZ due to hypokalemia.  Patient was started on hydralazine 50 mg p.o. 3 times daily as needed if SBP greater than 140 mmHg.  Patient was advised to monitor BP at home and follow with PCP to titrate medications accordingly. # Hypertriglyceridemia, Started Lipitor 80 mg p.o. daily and fenofibrate 45 mg p.o. daily.  Follow-up with PCP to repeat lipid profile after 3 months # Hypokalemia, potassium repleted.  Resolved # Vitamin B12 level 214, goal >400, s/p vitamin B12 1000 mcg IM injection daily x 2 doses given, started oral supplement on discharge.  Follow with PCP to repeat labs after  3 months. # Vitamin D insufficiency, started vitamin D 55 units p.o. weekly.  Follow with PCP to repeat vitamin D level after 3 to 6 months  Body mass index is 22.89 kg/m.  Nutrition Interventions:   Patient was ambulatory without any assistance. Patient was seen by physical therapy, who recommended no therapy needed on discharge,  On the day of the discharge  the patient's vitals were stable, and no other acute medical condition were reported by patient. the patient was felt safe to be discharge at Home .  Consultants: Neurology Procedures: EEG  Discharge Exam: General: Appear in no distress, no Rash; Oral Mucosa Clear, moist. Cardiovascular: S1 and S2 Present, no Murmur, Respiratory: normal respiratory effort, Bilateral Air entry present and no Crackles, no wheezes Abdomen: Bowel Sound present, Soft and no tenderness, no hernia Extremities: no Pedal edema, no calf tenderness Neurology: alert and oriented to time, place, and person affect appropriate.  Filed Weights   12/12/22 1417 12/12/22 1418  Weight: 59.9 kg 60.5 kg   Vitals:   12/14/22 0350 12/14/22 0745  BP: 130/70 (!) 145/84  Pulse: 77 82  Resp: 17 17  Temp: (!) 97.4 F (36.3 C) 97.8 F (36.6 C)  SpO2: 96% 99%    DISCHARGE MEDICATION: Allergies as of 12/14/2022       Reactions   Sulfa Antibiotics Hives   Penicillins Itching, Rash   Tape Rash        Medication List     STOP taking these medications    hydrochlorothiazide 25 MG tablet Commonly known as: HYDRODIURIL   ibuprofen 600 MG tablet Commonly known as: ADVIL   nitrofurantoin (macrocrystal-monohydrate) 100 MG capsule Commonly known as: Macrobid       TAKE these medications    aspirin EC 81 MG tablet Take 1 tablet (81 mg total) by mouth daily. Swallow whole. Start taking on: December 15, 2022   atorvastatin 80 MG tablet Commonly known as: LIPITOR Take 1 tablet (80 mg total) by mouth daily. Start taking on: December 15, 2022   clopidogrel 75 MG tablet Commonly known as: PLAVIX Take 1 tablet (75 mg total) by mouth daily for 20 days. Start taking on: December 15, 2022   cyanocobalamin 1000 MCG tablet Take 1 tablet (1,000 mcg total) by mouth daily. Start taking on: December 15, 2022   estradiol 1 MG tablet Commonly known as: ESTRACE Take 1 tablet (1 mg total) by mouth daily. What changed: when to  take this   fenofibrate 54 MG tablet Take 1 tablet (54 mg total) by mouth daily. Start taking on: December 15, 2022   hydrALAZINE 50 MG tablet Commonly known as: APRESOLINE Take 1 tablet (50 mg total) by mouth 3 (three) times daily as needed (If systolic BP greater than Q000111Q mmHg.). If systolic BP greater than Q000111Q mmHg.   losartan 100 MG tablet Commonly known as: COZAAR TAKE 1/2 TABLET BY MOUTH EVERY DAY What changed: when to take this   Vitamin D (Ergocalciferol) 1.25 MG (50000 UNIT) Caps capsule Commonly known as: DRISDOL Take 1 capsule (50,000 Units total) by mouth every 7 (seven) days. Start taking on: December 20, 2022   zolpidem 10 MG tablet Commonly known as: AMBIEN TAKE 1 TABLET (10 MG TOTAL) BY MOUTH AT BEDTIME AS NEEDED. FOR SLEEP       Allergies  Allergen Reactions   Sulfa Antibiotics Hives   Penicillins Itching and Rash   Tape Rash   Discharge Instructions     Ambulatory referral to Neurology  Complete by: As directed    Call MD for:   Complete by: As directed    Numbness or weakness, any headache or dizziness, any visual changes.  Any new neurological changes   Call MD for:  difficulty breathing, headache or visual disturbances   Complete by: As directed    Call MD for:  extreme fatigue   Complete by: As directed    Call MD for:  persistant dizziness or light-headedness   Complete by: As directed    Call MD for:  persistant nausea and vomiting   Complete by: As directed    Call MD for:  severe uncontrolled pain   Complete by: As directed    Diet - low sodium heart healthy   Complete by: As directed    Discharge instructions   Complete by: As directed    Follow-up with PCP in 1 week, repeat B12 and vitamin D level after 3 months. Follow with neurology in 1 to 2 weeks   Increase activity slowly   Complete by: As directed        The results of significant diagnostics from this hospitalization (including imaging, microbiology, ancillary and laboratory)  are listed below for reference.    Significant Diagnostic Studies: MR BRAIN W WO CONTRAST  Result Date: 12/14/2022 CLINICAL DATA:  TIA.  Left facial droop EXAM: MRI HEAD WITHOUT AND WITH CONTRAST TECHNIQUE: Multiplanar, multiecho pulse sequences of the brain and surrounding structures were obtained without and with intravenous contrast. CONTRAST:  7.61m GADAVIST GADOBUTROL 1 MMOL/ML IV SOLN COMPARISON:  MRI and CTA from 2 days ago FINDINGS: Brain: No acute infarction, hemorrhage, hydrocephalus, extra-axial collection or mass lesion. Normal appearance of the brainstem, cisterns, and temporal bones. No abnormal enhancement Vascular: Normal flow voids. Skull and upper cervical spine: Normal marrow signal. Sinuses/Orbits: Partial right mastoid opacification. IMPRESSION: Normal brain MRI. Electronically Signed   By: JJorje GuildM.D.   On: 12/14/2022 07:38   ECHOCARDIOGRAM COMPLETE  Result Date: 12/13/2022    ECHOCARDIOGRAM REPORT   Patient Name:   TICIE SMATHERSDate of Exam: 12/13/2022 Medical Rec #:  [id number]      Height:       64.0 in Accession #:    [id number]     Weight:       133.4 lb Date of Birth:  09/11/1966/12/22      BSA:          1.647 m Patient Age:    89years        BP:           155/86 mmHg Patient Gender: F               HR:           83 bpm. Exam Location:  [facility] Procedure: 2D Echo, Cardiac Doppler, Limited Color Doppler and Saline Contrast            Bubble Study Indications:     Stroke I63.9  History:         Patient has prior history of Echocardiogram examinations, most                  recent 11/30/2016. Risk Factors:Hypertension.  Sonographer:     JSherrie SportReferring Phys:  [id number] GerlachNIU Diagnosing Phys: CNelva BushMD  Sonographer Comments: Image quality was good. IMPRESSIONS  1. Left ventricular ejection fraction, by estimation, is 60 to 65%. The left ventricle has normal function. The left ventricle  has no regional wall motion abnormalities. There is mild left ventricular  hypertrophy. Left ventricular diastolic parameters are consistent with Grade I diastolic dysfunction (impaired relaxation).  2. Right ventricular systolic function is normal. The right ventricular size is normal. Tricuspid regurgitation signal is inadequate for assessing PA pressure.  3. The mitral valve is normal in structure. No evidence of mitral valve regurgitation. No evidence of mitral stenosis.  4. The aortic valve is tricuspid. There is mild thickening of the aortic valve. Aortic valve regurgitation is not visualized. Aortic valve sclerosis is present, with no evidence of aortic valve stenosis.  5. The inferior vena cava is normal in size with greater than 50% respiratory variability, suggesting right atrial pressure of 3 mmHg.  6. Agitated saline contrast bubble study was negative, with no evidence of any interatrial shunt. FINDINGS  Left Ventricle: Left ventricular ejection fraction, by estimation, is 60 to 65%. The left ventricle has normal function. The left ventricle has no regional wall motion abnormalities. The left ventricular internal cavity size was normal in size. There is  mild left ventricular hypertrophy. Left ventricular diastolic parameters are consistent with Grade I diastolic dysfunction (impaired relaxation). Right Ventricle: The right ventricular size is normal. No increase in right ventricular wall thickness. Right ventricular systolic function is normal. Tricuspid regurgitation signal is inadequate for assessing PA pressure. Left Atrium: Left atrial size was normal in size. Right Atrium: Right atrial size was normal in size. Pericardium: There is no evidence of pericardial effusion. Mitral Valve: The mitral valve is normal in structure. No evidence of mitral valve regurgitation. No evidence of mitral valve stenosis. Tricuspid Valve: The tricuspid valve is normal in structure. Tricuspid valve regurgitation is trivial. Aortic Valve: The aortic valve is tricuspid. There is mild thickening  of the aortic valve. Aortic valve regurgitation is not visualized. Aortic valve sclerosis is present, with no evidence of aortic valve stenosis. Aortic valve mean gradient measures 2.0  mmHg. Aortic valve peak gradient measures 3.0 mmHg. Aortic valve area, by VTI measures 2.67 cm. Pulmonic Valve: The pulmonic valve was normal in structure. Pulmonic valve regurgitation is not visualized. No evidence of pulmonic stenosis. Aorta: The aortic root is normal in size and structure. Pulmonary Artery: The pulmonary artery is of normal size. Venous: The inferior vena cava is normal in size with greater than 50% respiratory variability, suggesting right atrial pressure of 3 mmHg. IAS/Shunts: No atrial level shunt detected by color flow Doppler. Agitated saline contrast was given intravenously to evaluate for intracardiac shunting. Agitated saline contrast bubble study was negative, with no evidence of any interatrial shunt.  LEFT VENTRICLE PLAX 2D LVIDd:         3.70 cm   Diastology LVIDs:         2.30 cm   LV e' medial:    5.00 cm/s LV PW:         1.10 cm   LV E/e' medial:  10.9 LV IVS:        1.00 cm   LV e' lateral:   7.72 cm/s LVOT diam:     2.00 cm   LV E/e' lateral: 7.1 LV SV:         43 LV SV Index:   26 LVOT Area:     3.14 cm  RIGHT VENTRICLE RV Basal diam:  1.60 cm RV Mid diam:    1.50 cm LEFT ATRIUM             Index  RIGHT ATRIUM          Index LA diam:        2.70 cm 1.64 cm/m   RA Area:     4.95 cm LA Vol (A2C):   16.5 ml 10.02 ml/m  RA Volume:   5.83 ml  3.54 ml/m LA Vol (A4C):   26.1 ml 15.85 ml/m LA Biplane Vol: 20.9 ml 12.69 ml/m  AORTIC VALVE AV Area (Vmax):    2.41 cm AV Area (Vmean):   2.37 cm AV Area (VTI):     2.67 cm AV Vmax:           86.50 cm/s AV Vmean:          62.700 cm/s AV VTI:            0.160 m AV Peak Grad:      3.0 mmHg AV Mean Grad:      2.0 mmHg LVOT Vmax:         66.30 cm/s LVOT Vmean:        47.400 cm/s LVOT VTI:          0.136 m LVOT/AV VTI ratio: 0.85  AORTA Ao Root  diam: 2.60 cm MITRAL VALVE MV Area (PHT): 5.06 cm    SHUNTS MV Decel Time: 150 msec    Systemic VTI:  0.14 m MV E velocity: 54.50 cm/s  Systemic Diam: 2.00 cm MV A velocity: 94.30 cm/s MV E/A ratio:  0.58 Harrell Gave End MD Electronically signed by Nelva Bush MD Signature Date/Time: 12/13/2022/12:23:03 PM    Final    MR BRAIN WO CONTRAST  Result Date: 12/12/2022 CLINICAL DATA:  Facial droop and slurred speech, left-sided paresthesias EXAM: MRI HEAD WITHOUT CONTRAST TECHNIQUE: Multiplanar, multiecho pulse sequences of the brain and surrounding structures were obtained without intravenous contrast. COMPARISON:  06/05/2018 MRI head, correlation is also made with CT head 12/12/2022 FINDINGS: Brain: No restricted diffusion to suggest acute or subacute infarct. No acute hemorrhage, mass, mass effect, or midline shift. No hydrocephalus or extra-axial collection. Normal pituitary and craniocervical junction. No hemosiderin deposition to suggest remote hemorrhage. Cerebral volume is within normal limits for age. Vascular: Normal arterial flow voids. Skull and upper cervical spine: Normal marrow signal. Sinuses/Orbits: Clear paranasal sinuses. No acute finding in the orbits. Other: Fluid in the right mastoid air cells. IMPRESSION: No acute intracranial process. No evidence of acute or subacute infarct. Electronically Signed   By: Merilyn Baba M.D.   On: 12/12/2022 22:41   CT ANGIO HEAD NECK W WO CM  Result Date: 12/12/2022 CLINICAL DATA:  Left-sided weakness, slurred speech, stroke suspected EXAM: CT ANGIOGRAPHY HEAD AND NECK TECHNIQUE: Multidetector CT imaging of the head and neck was performed using the standard protocol during bolus administration of intravenous contrast. Multiplanar CT image reconstructions and MIPs were obtained to evaluate the vascular anatomy. Carotid stenosis measurements (when applicable) are obtained utilizing NASCET criteria, using the distal internal carotid diameter as the  denominator. RADIATION DOSE REDUCTION: This exam was performed according to the departmental dose-optimization program which includes automated exposure control, adjustment of the mA and/or kV according to patient size and/or use of iterative reconstruction technique. CONTRAST:  98m OMNIPAQUE IOHEXOL 350 MG/ML SOLN COMPARISON:  No prior CTA available, correlation is made with CT head 12/12/2022 FINDINGS: CT HEAD FINDINGS For noncontrast findings, please see same day CT head. CTA NECK FINDINGS Aortic arch: Standard branching. Imaged portion shows no evidence of aneurysm or dissection. No significant stenosis of the major arch vessel origins.  Right carotid system: No evidence of stenosis, dissection, or occlusion. Left carotid system: No evidence of stenosis, dissection, or occlusion. Vertebral arteries: No evidence of stenosis, dissection, or occlusion. Skeleton: No acute osseous abnormality. Degenerative changes in the cervical spine. Other neck: 6 mm hypoenhancing lesion in the left thyroid lobe, for which no follow-up is currently indicated. (Reference: J Am Coll Radiol. 2015 Feb;12(2): 143-50) Upper chest: No focal pulmonary opacity or pleural effusion. Review of the MIP images confirms the above findings CTA HEAD FINDINGS Anterior circulation: Both internal carotid arteries are patent to the termini, without significant stenosis. A1 segments patent. Normal anterior communicating artery. Anterior cerebral arteries are patent to their distal aspects. No M1 stenosis or occlusion. MCA branches perfused and symmetric. Posterior circulation: Vertebral arteries patent to the vertebrobasilar junction without stenosis. Basilar patent to its distal aspect. Superior cerebellar arteries patent proximally. Patent P1 segments. PCAs perfused to their distal aspects without stenosis. The bilateral posterior communicating arteries are not visualized. Venous sinuses: As permitted by contrast timing, patent. Anatomic variants:  None significant. Review of the MIP images confirms the above findings IMPRESSION: 1. No intracranial large vessel occlusion or significant stenosis. 2. No hemodynamically significant stenosis in the neck. Electronically Signed   By: Merilyn Baba M.D.   On: 12/12/2022 20:56   CT HEAD CODE STROKE WO CONTRAST  Result Date: 12/12/2022 CLINICAL DATA:  Code stroke. Neuro deficit, acute, stroke suspected. Dizziness. Left-sided weakness. Slurred speech. EXAM: CT HEAD WITHOUT CONTRAST TECHNIQUE: Contiguous axial images were obtained from the base of the skull through the vertex without intravenous contrast. RADIATION DOSE REDUCTION: This exam was performed according to the departmental dose-optimization program which includes automated exposure control, adjustment of the mA and/or kV according to patient size and/or use of iterative reconstruction technique. COMPARISON:  Brain MRI 06/05/2018. Head CT 06/26/2013. FINDINGS: Brain: No age advanced or lobar predominant parenchymal atrophy. There is no acute intracranial hemorrhage. No demarcated cortical infarct. No extra-axial fluid collection. No evidence of an intracranial mass. No midline shift. Vascular: No hyperdense vessel. Atherosclerotic calcifications. Skull: No fracture or aggressive osseous lesion. Sinuses/Orbits: No mass or acute finding within the imaged orbits. No significant paranasal sinus disease at the imaged levels. ASPECTS ([place] Stroke Program Early CT Score) - Ganglionic level infarction (caudate, lentiform nuclei, internal capsule, insula, M1-M3 cortex): 7 - Supraganglionic infarction (M4-M6 cortex): 3 Total score (0-10 with 10 being normal): 10 These results were communicated to Dr. Quinn Axe At 2:43 pmon 12/12/2022 by text page via the [facility] messaging system. IMPRESSION: No evidence of an acute intracranial abnormality. ASPECTS is 10. Electronically Signed   By: Kellie Simmering D.O.   On: 12/12/2022 14:43    Microbiology: No results found for this or  any previous visit (from the past 240 hour(s)).   Labs: CBC: Recent Labs  Lab 12/12/22 1438  WBC 6.6  NEUTROABS 4.1  HGB 13.4  HCT 40.2  MCV 86.5  PLT 0000000   Basic Metabolic Panel: Recent Labs  Lab 12/12/22 1438 12/12/22 1815 12/13/22 0545  NA 135  --  134*  K 2.9*  --  3.5  CL 100  --  102  CO2 27  --  27  GLUCOSE 100*  --  95  BUN 15  --  12  CREATININE 0.90  --  0.88  CALCIUM 9.1  --  8.7*  MG  --  2.3  --   PHOS  --  3.7  --    Liver Function Tests: Recent Labs  Lab 12/12/22 1438  AST 21  ALT 19  ALKPHOS 62  BILITOT 0.4  PROT 8.1  ALBUMIN 4.0   No results for input(s): "LIPASE", "AMYLASE" in the last 168 hours. No results for input(s): "AMMONIA" in the last 168 hours. Cardiac Enzymes: No results for input(s): "CKTOTAL", "CKMB", "CKMBINDEX", "TROPONINI" in the last 168 hours. BNP (last 3 results) No results for input(s): "BNP" in the last 8760 hours. CBG: Recent Labs  Lab 12/12/22 1418 12/12/22 1904  GLUCAP 100* 104*    Time spent: 35 minutes  Signed:  Val Riles  [facility] 12/14/2022 12:04 PM

## 2022-12-14 NOTE — Progress Notes (Signed)
Transport here, taking patient on bed to get MRI done.

## 2022-12-14 NOTE — Procedures (Signed)
Routine EEG Report  Jill Porter is a 57 y.o. female with a history of altered mental status who is undergoing an EEG to evaluate for seizures.  Report: This EEG was acquired with electrodes placed according to the International 10-20 electrode system (including Fp1, Fp2, F3, F4, C3, C4, P3, P4, O1, O2, T3, T4, T5, T6, A1, A2, Fz, Cz, Pz). The following electrodes were missing or displaced: none.  The occipital dominant rhythm was 9 Hz. This activity is reactive to stimulation. Drowsiness was manifested by background fragmentation; deeper stages of sleep were identified by K complexes and sleep spindles. There was no focal slowing. There were no interictal epileptiform discharges. There were no electrographic seizures identified. There was no abnormal response to photic stimulation or hyperventilation.   Impression: This EEG was obtained while awake and asleep and is normal.    Clinical Correlation: Normal EEGs, however, do not rule out epilepsy.  Su Monks, MD [facility] [phone]  If 7pm- 7am, please page neurology on call as listed in [place].

## 2022-12-14 NOTE — Plan of Care (Signed)
  Problem: Education: Goal: Knowledge of General Education information will improve Description: Including pain rating scale, medication(s)/side effects and non-pharmacologic comfort measures Outcome: Progressing   Problem: Activity: Goal: Risk for activity intolerance will decrease Outcome: Progressing   Problem: Nutrition: Goal: Adequate nutrition will be maintained Outcome: Progressing   Problem: Coping: Goal: Level of anxiety will decrease Outcome: Progressing   Problem: Pain Managment: Goal: General experience of comfort will improve Outcome: Progressing   Problem: Safety: Goal: Ability to remain free from injury will improve Outcome: Progressing   Problem: Skin Integrity: Goal: Risk for impaired skin integrity will decrease Outcome: Progressing   Problem: Education: Goal: Knowledge of disease or condition will improve Outcome: Progressing

## 2022-12-14 NOTE — TOC CM/SW Note (Signed)
Patient has orders to discharge home today. Chart reviewed. No TOC needs identified. CSW signing off.  Linell Meldrum, CSW [phone]  

## 2022-12-14 NOTE — Plan of Care (Signed)
MRI brain wwo was normal. EEG was normal as well. Workup is completed. Patient should be discharged on medication recommendations included in my progress note from yesterday. I have referred her to outpatient neurology for f/u. At discharge give her strict return precautions that if she experiences any acute neurologic sx she should immediately call 911 and return to ED for evaluation.  Su Monks, MD [facility] [phone]  If 7pm- 7am, please page neurology on call as listed in [place].

## 2022-12-15 ENCOUNTER — Telehealth: Payer: Self-pay

## 2022-12-15 ENCOUNTER — Encounter: Payer: Self-pay | Admitting: Obstetrics and Gynecology

## 2022-12-15 ENCOUNTER — Ambulatory Visit (INDEPENDENT_AMBULATORY_CARE_PROVIDER_SITE_OTHER): Payer: Commercial Managed Care - PPO | Admitting: Obstetrics and Gynecology

## 2022-12-15 VITALS — BP 153/92 | HR 99 | Ht 64.0 in | Wt 128.5 lb

## 2022-12-15 DIAGNOSIS — Z9889 Other specified postprocedural states: Secondary | ICD-10-CM

## 2022-12-15 DIAGNOSIS — Z4889 Encounter for other specified surgical aftercare: Secondary | ICD-10-CM

## 2022-12-15 MED ORDER — ESTRADIOL 0.05 MG/24HR TD PTWK: 0.0500 mg | MEDICATED_PATCH | TRANSDERMAL | 2 refills | Status: DC

## 2022-12-15 NOTE — Progress Notes (Signed)
HPI:      [name] is a 57 y.o. [phone] who LMP was No LMP recorded. Patient has had a hysterectomy.  Subjective:   She presents today 8 weeks postop from anterior repair with TOT.  She reports no issues with her urine.  She is voiding without difficulty and not leaking. Of significant note she recently was hospitalized with a stroke.  She is now mostly recovered.  She reports that she does not feel quite right but she is not having other issues. She has been taking oral estrogen.    Hx: The following portions of the patient's history were reviewed and updated as appropriate:             She  has a past medical history of Cervical dysphagia, Climacteric, GERD (gastroesophageal reflux disease), Hypertension, Melanoma ([place]), Motion sickness, Pelvic adhesions, and Recurrent cystitis. She does not have any pertinent problems on file. She  has a past surgical history that includes Cholecystectomy (06/19/2012); Melanoma excision (06/2012); Excision basal cell carcinoma (2013); Breast cyst aspiration (01/2010); Ovary surgery (1997); Abdominal hysterectomy (2000); Liposuction; Exploratory laparotomy; Rotator cuff repair (Left); Colonoscopy with propofol (N/A, 07/06/2015); Parathyroidectomy (04/14/2017); Cystocele repair (N/A, 10/17/2022); and Pubovaginal sling (N/A, 10/17/2022). Her family history includes Heart attack (age of onset: 52) in her maternal grandfather; Hypertension in her father and mother; Lymphoma in her mother. She  reports that she has never smoked. She has never been exposed to tobacco smoke. She has never used smokeless tobacco. She reports that she does not drink alcohol and does not use drugs. She has a current medication list which includes the following prescription(s): aspirin ec, atorvastatin, clopidogrel, cyanocobalamin, estradiol, fenofibrate, hydralazine, losartan, [START ON 12/20/2022] vitamin d (ergocalciferol), and zolpidem. She is allergic to sulfa antibiotics,  penicillins, and tape.       Review of Systems:  Review of Systems  Constitutional: Denied constitutional symptoms, night sweats, recent illness, fatigue, fever, insomnia and weight loss.  Eyes: Denied eye symptoms, eye pain, photophobia, vision change and visual disturbance.  Ears/Nose/Throat/Neck: Denied ear, nose, throat or neck symptoms, hearing loss, nasal discharge, sinus congestion and sore throat.  Cardiovascular: Denied cardiovascular symptoms, arrhythmia, chest pain/pressure, edema, exercise intolerance, orthopnea and palpitations.  Respiratory: Denied pulmonary symptoms, asthma, pleuritic pain, productive sputum, cough, dyspnea and wheezing.  Gastrointestinal: Denied, gastro-esophageal reflux, melena, nausea and vomiting.  Genitourinary: Denied genitourinary symptoms including symptomatic vaginal discharge, pelvic relaxation issues, and urinary complaints.  Musculoskeletal: Denied musculoskeletal symptoms, stiffness, swelling, muscle weakness and myalgia.  Dermatologic: Denied dermatology symptoms, rash and scar.  Neurologic: Denied neurology symptoms, dizziness, headache, neck pain and syncope.  Psychiatric: Denied psychiatric symptoms, anxiety and depression.  Endocrine: Denied endocrine symptoms including hot flashes and night sweats.   Meds:   Current Outpatient Medications on File Prior to Visit  Medication Sig Dispense Refill   aspirin EC 81 MG tablet Take 1 tablet (81 mg total) by mouth daily. Swallow whole. 90 tablet 3   atorvastatin (LIPITOR) 80 MG tablet Take 1 tablet (80 mg total) by mouth daily. 30 tablet 5   clopidogrel (PLAVIX) 75 MG tablet Take 1 tablet (75 mg total) by mouth daily for 20 days. 20 tablet 0   cyanocobalamin 1000 MCG tablet Take 1 tablet (1,000 mcg total) by mouth daily. 90 tablet 0   estradiol (ESTRACE) 1 MG tablet Take 1 tablet (1 mg total) by mouth daily. (Patient taking differently: Take 1 mg by mouth every evening.) 90 tablet 3   fenofibrate 54  MG tablet Take 1 tablet (54 mg total) by mouth daily. 30 tablet 5   hydrALAZINE (APRESOLINE) 50 MG tablet Take 1 tablet (50 mg total) by mouth 3 (three) times daily as needed (If systolic BP greater than [id number] mmHg.). If systolic BP greater than Q000111Q mmHg. 90 tablet 0   losartan (COZAAR) 100 MG tablet TAKE 1/2 TABLET BY MOUTH EVERY DAY (Patient taking differently: Take 50 mg by mouth 2 (two) times daily.) 45 tablet 1   [START ON 12/20/2022] Vitamin D, Ergocalciferol, (DRISDOL) 1.25 MG (50000 UNIT) CAPS capsule Take 1 capsule (50,000 Units total) by mouth every 7 (seven) days. 12 capsule 0   zolpidem (AMBIEN) 10 MG tablet TAKE 1 TABLET (10 MG TOTAL) BY MOUTH AT BEDTIME AS NEEDED. FOR SLEEP 30 tablet 4   No current facility-administered medications on file prior to visit.      Objective:     Vitals:   12/15/22 0902  BP: (!) 158/95  Pulse: 99   Filed Weights   12/15/22 0902  Weight: 128 lb 8 oz (58.3 kg)              Physical examination   Pelvic:   Vulva: Normal appearance.  No lesions.  Vagina: No lesions or abnormalities noted.  Healed well  Support: Normal pelvic support.  Urethra No masses tenderness or scarring.  Meatus Normal size without lesions or prolapse.     Anus: Normal exam.  No lesions.  Perineum: Normal exam.  No lesions.             Assessment:    [id number] Patient Active Problem List   Diagnosis Date Noted   Altered mental status 12/14/2022   TIA (transient ischemic attack) 12/12/2022   Depression with anxiety 12/12/2022   Incomplete tear of rotator cuff 09/11/2019   Shoulder pain 09/11/2019   Stiffness of shoulder joint 09/11/2019   Strain of rotator cuff capsule 09/11/2019   Disorder of bursae of shoulder region 09/11/2019   Status post lumbar discectomy 05/07/2019   Hand weakness 06/26/2018   Numbness and tingling 05/30/2018   Bilateral hand pain 04/03/2018   Diplopia 04/03/2018   Numbness and tingling in both hands 04/03/2018   Special screening  for malignant neoplasms, colon    Perimenopausal vasomotor symptoms 06/10/2015   Status post hysterectomy 06/10/2015   History of cervical dysplasia 06/10/2015   History of oophorectomy, unilateral 06/10/2015   Cholelithiasis 03/31/2015   Melanoma ([place]) 03/31/2015   Hx of abnormal cervical Pap smear 03/31/2015   Tinea versicolor 03/31/2015   Vitamin D deficiency 03/31/2015   Hyperlipemia 03/31/2015   Depression 03/31/2015   Acute anxiety 03/31/2015   Insomnia 03/31/2015   Hypertension 03/31/2015   Plantar fasciitis 03/31/2015   Frequent UTI 03/31/2015   Discoid eczema 03/31/2015     1. Postoperative state     Patient with excellent recovery.   Recent stroke -recovering   Plan:            1.  May resume normal activities with the exception of heavy lifting.  2.  Advised use of estrogen patch versus oral estrogen because of her recent history of stroke.  And because she desires estrogen replacement.   Orders No orders of the defined types were placed in this encounter.   No orders of the defined types were placed in this encounter.     F/U  Return in about 3 months (around 03/17/2023).  Finis Bud, M.D. 12/15/2022 9:21 AM

## 2022-12-15 NOTE — Telephone Encounter (Signed)
Pt had stroke and was referred to you would like you to be PCP and needs f/up appt. Are you taking  new appts now?

## 2022-12-15 NOTE — Telephone Encounter (Signed)
Reached out to [facility]'s office and awaiting a response.

## 2022-12-15 NOTE — Progress Notes (Signed)
Patient presents for 2 month postop follow-up following AR repair with a sling. She states no longer experiencing any bleeding, pain or trouble with urination. Recently discharged from hospital for a stroke. No additional questions.

## 2022-12-19 NOTE — Telephone Encounter (Signed)
I spoke with Jenate Martinique, CMA, and confirmed that patient's visit tomorrow will be a combination New Patient Visit and Hospital Follow-up.  Domingo Mend states she has already spoken with [name], [place], and she approved this combination visit for patient.  Domingo Mend states she will ask [name], FNP, to document her approval of this visit.  I changed the visit type for patient's appointment tomorrow to New Patient so new patient information would be sent to MyChart.  I spoke with patient and asked her to please complete the new patient information via MyChart before check-in for her visit tomorrow.  I asked patient to please arrive 15 minutes early for her visit with her information complete and her photo id and insurance card.

## 2022-12-20 ENCOUNTER — Ambulatory Visit (INDEPENDENT_AMBULATORY_CARE_PROVIDER_SITE_OTHER): Payer: Commercial Managed Care - PPO | Admitting: Family

## 2022-12-20 ENCOUNTER — Encounter: Payer: Self-pay | Admitting: Family

## 2022-12-20 VITALS — BP 132/82 | HR 94 | Temp 98.6°F | Ht 64.0 in | Wt 129.8 lb

## 2022-12-20 DIAGNOSIS — E538 Deficiency of other specified B group vitamins: Secondary | ICD-10-CM | POA: Insufficient documentation

## 2022-12-20 DIAGNOSIS — E559 Vitamin D deficiency, unspecified: Secondary | ICD-10-CM

## 2022-12-20 DIAGNOSIS — R946 Abnormal results of thyroid function studies: Secondary | ICD-10-CM

## 2022-12-20 DIAGNOSIS — Z7689 Persons encountering health services in other specified circumstances: Secondary | ICD-10-CM

## 2022-12-20 DIAGNOSIS — G459 Transient cerebral ischemic attack, unspecified: Secondary | ICD-10-CM | POA: Diagnosis not present

## 2022-12-20 DIAGNOSIS — I10 Essential (primary) hypertension: Secondary | ICD-10-CM | POA: Diagnosis not present

## 2022-12-20 NOTE — Patient Instructions (Addendum)
My pleasure meeting you today.  Please let me know if any issue in getting appointment scheduled with neurology, Dr. Manuella Ghazi.  As discussed, please continue dual antiplatelet therapy with plavix and aspirin for 21 days followed by aspirin 81 mg p.o. daily alone  Please continue  Lipitor 80 mg p.o. daily, fenofibrate 45mg  daily indefinitely.   Please try Salonpas pain patch or Blue Emu to left side of neck.  If soreness does not resolve with gentle heat, stretching, please let me know  Thyroid ultrasound has been ordered.  Let us know if you dont hear back within a week in regards to an appointment being scheduled.   So that you are aware, if you are [facility] MyChart user , please pay attention to your MyChart messages as you may receive a MyChart message with a phone number to call and schedule this test/appointment own your own from our referral coordinator. This is a new process so I do not want you to miss this message.  If you are not a MyChart user, you will receive a phone call.

## 2022-12-20 NOTE — Progress Notes (Addendum)
Assessment & Plan:  Primary hypertension Assessment & Plan: Chronic, stable .continue  losartan 50 mg twice daily. She is taking hydralazine 50 mg 3 times daily prn for BP > 150/80.  I advised based on readings at home for her to consider taking hydralazine 50 mg 3 times daily scheduled as I think her blood pressure can tolerate this.  She will monitor blood pressure over the next couple of weeks.    TIA (transient ischemic attack) Assessment & Plan: Reviewed hospitalization with patient.  Medications reconciled.  Pending appointment with Dr. Manuella Ghazi, neurology  Continue aspirin 81 mg daily, Plavix 75 mg daily . We discussed dual antiplatelet therapy for 21 days followed by aspirin 81 mg p.o. daily. She will remain compliant with lipitor 80 mg p.o. daily, fenofibrate 45mg  qd.    Orders: -     B12 and Folate Panel; Future -     VITAMIN D 25 Hydroxy (Vit-D Deficiency, Fractures); Future -     Lipid panel; Future -     US THYROID; Future -     Comprehensive metabolic panel; Future  B12 deficiency  Abnormal thyroid scan -     US THYROID; Future  Vitamin D deficiency -     VITAMIN D 25 Hydroxy (Vit-D Deficiency, Fractures); Future  Encounter to establish care Assessment & Plan: Reviewed past medical surgical and social history.  Ordered labs to be repeated in 3 months time.  Reviewed health maintenance.  Mammogram, colonoscopy and Pap smear are up-to-date.      Return precautions given.   Risks, benefits, and alternatives of the medications and treatment plan prescribed today were discussed, and patient expressed understanding.   Education regarding symptom management and diagnosis given to patient on AVS either electronically or printed.  Return in about 3 months (around 03/22/2023) for sch official transfer of care visit.  [name], FNP  Subjective:    Patient ID: Jill Porter, female    DOB: [birth year], 57 y.o.   MRN: [id number]  CC: Jill Porter is a 57  y.o. female who presents today for follow up hospitalization and as a new patient  HPI: Overall feels well today.  No trouble swallowing.  No residual left-sided weakness. She does report left side of her neck has been sore since hospitalization.  Pain when putting ear to shoulder.  no numbness or weakness in upper extremities.  Blood pressure at home running 150/80.    Never smoker  Patient presented to [facility] for left-sided weakness, slurred speech and dizziness 12/12/22 CT head is negative for acute intracranial abnormalities.  Dr. Quinn Axe of neurology is consulted Admission for suspected TIA.  Symptoms largely resolved.  Normal MRI brain on 12/12/22 and 12/14/22  CTA head and neck which was negative for large vessel occlusion or significant stenosis.    Transthoracic echocardiogram [id number] , grade 1 systolic dysfunction; bubble study negative  Normal EEG 12/14/22   She was discharged on aspirin 81 mg daily, Plavix 300 mg x 1 dose followed by 75 mg daily ; dual antiplatelet therapy for 21 days followed by aspirin 81 mg p.o. daily. Lipitor 80 mg p.o. daily, fenofibrate 45mg  qd.   Resumed losartan at discharge and started on hydralazine 50mg  TID. No longer on HCTZ.   She follows with Dr. [name], GYN who prescribes estradiol.  History of hysterectomy  Awaiting on appointment with Dr Manuella Ghazi   Allergies: Sulfa antibiotics, Penicillins, and Tape Current Outpatient Medications on File Prior to Visit  Medication Sig  Dispense Refill   aspirin EC 81 MG tablet Take 1 tablet (81 mg total) by mouth daily. Swallow whole. 90 tablet 3   atorvastatin (LIPITOR) 80 MG tablet Take 1 tablet (80 mg total) by mouth daily. 30 tablet 5   clopidogrel (PLAVIX) 75 MG tablet Take 1 tablet (75 mg total) by mouth daily for 20 days. 20 tablet 0   cyanocobalamin 1000 MCG tablet Take 1 tablet (1,000 mcg total) by mouth daily. 90 tablet 0   estradiol (CLIMARA - DOSED IN MG/24 HR) 0.05 mg/24hr patch Place 1 patch (0.05  mg total) onto the skin once a week. 4 patch 2   fenofibrate 54 MG tablet Take 1 tablet (54 mg total) by mouth daily. 30 tablet 5   hydrALAZINE (APRESOLINE) 50 MG tablet Take 1 tablet (50 mg total) by mouth 3 (three) times daily as needed (If systolic BP greater than 774 mmHg.). If systolic BP greater than 128 mmHg. 90 tablet 0   losartan (COZAAR) 100 MG tablet TAKE 1/2 TABLET BY MOUTH EVERY DAY (Patient taking differently: Take 50 mg by mouth 2 (two) times daily.) 45 tablet 1   Vitamin D, Ergocalciferol, (DRISDOL) 1.25 MG (50000 UNIT) CAPS capsule Take 1 capsule (50,000 Units total) by mouth every 7 (seven) days. 12 capsule 0   zolpidem (AMBIEN) 10 MG tablet TAKE 1 TABLET (10 MG TOTAL) BY MOUTH AT BEDTIME AS NEEDED. FOR SLEEP 30 tablet 4   No current facility-administered medications on file prior to visit.    Review of Systems  Constitutional:  Negative for chills and fever.  Eyes:  Negative for visual disturbance.  Respiratory:  Negative for cough.   Cardiovascular:  Negative for chest pain and palpitations.  Gastrointestinal:  Negative for nausea and vomiting.  Musculoskeletal:  Positive for neck pain.  Neurological:  Negative for dizziness, facial asymmetry, numbness and headaches.      Objective:    BP 132/82   Pulse 94   Temp 98.6 F (37 C) (Oral)   Ht 5\' 4"  (1.626 m)   Wt 129 lb 12.8 oz (58.9 kg)   SpO2 97%   BMI 22.28 kg/m  BP Readings from Last 3 Encounters:  12/20/22 132/82  12/15/22 (!) 153/92  12/14/22 (!) 145/84   Wt Readings from Last 3 Encounters:  12/20/22 129 lb 12.8 oz (58.9 kg)  12/15/22 128 lb 8 oz (58.3 kg)  12/12/22 133 lb 6.1 oz (60.5 kg)    Physical Exam Vitals reviewed.  Constitutional:      Appearance: She is well-developed.  Eyes:     Conjunctiva/sclera: Conjunctivae normal.  Neck:      Comments: Tenderness over lateral neck of soft tissue noted on exam.  Discomfort when asked to put left ear to left shoulder.  Grip strength equal  bilateral upper extremities.  Strength bilateral upper extremities and lower extremities 5 /5 Cardiovascular:     Rate and Rhythm: Normal rate and regular rhythm.     Pulses: Normal pulses.     Heart sounds: Normal heart sounds.  Pulmonary:     Effort: Pulmonary effort is normal.     Breath sounds: Normal breath sounds. No wheezing, rhonchi or rales.  Musculoskeletal:     Cervical back: No torticollis. Muscular tenderness present. No spinous process tenderness. Normal range of motion.  Skin:    General: Skin is warm and dry.  Neurological:     Mental Status: She is alert.  Psychiatric:        Speech: Speech  normal.        Behavior: Behavior normal.        Thought Content: Thought content normal.

## 2022-12-20 NOTE — Assessment & Plan Note (Addendum)
Reviewed hospitalization with patient.  Medications reconciled.  Pending appointment with Dr. Manuella Ghazi, neurology  Continue aspirin 81 mg daily, Plavix 75 mg daily . We discussed dual antiplatelet therapy for 21 days followed by aspirin 81 mg p.o. daily. She will remain compliant with lipitor 80 mg p.o. daily, fenofibrate 45mg  qd.

## 2022-12-20 NOTE — Assessment & Plan Note (Addendum)
Chronic, stable .continue  losartan 50 mg twice daily. She is taking hydralazine 50 mg 3 times daily prn for BP > 150/80.  I advised based on readings at home for her to consider taking hydralazine 50 mg 3 times daily scheduled as I think her blood pressure can tolerate this.  She will monitor blood pressure over the next couple of weeks.

## 2022-12-20 NOTE — Assessment & Plan Note (Addendum)
Reviewed past medical surgical and social history.  Ordered labs to be repeated in 3 months time.  Reviewed health maintenance.  Mammogram, colonoscopy and Pap smear are up-to-date.

## 2022-12-20 NOTE — Addendum Note (Signed)
Addended by: Burnard Hawthorne on: 12/20/2022 03:30 PM   Modules accepted: Level of Service

## 2022-12-30 ENCOUNTER — Ambulatory Visit
Admission: RE | Admit: 2022-12-30 | Discharge: 2022-12-30 | Disposition: A | Discharge status: Home / Self Care | Payer: Commercial Managed Care - PPO | Source: Ambulatory Visit | Attending: Family | Admitting: Family

## 2022-12-30 DIAGNOSIS — R946 Abnormal results of thyroid function studies: Secondary | ICD-10-CM | POA: Diagnosis present

## 2022-12-30 DIAGNOSIS — G459 Transient cerebral ischemic attack, unspecified: Secondary | ICD-10-CM | POA: Diagnosis present

## 2023-01-04 ENCOUNTER — Other Ambulatory Visit: Payer: Self-pay | Admitting: Obstetrics and Gynecology

## 2023-01-04 DIAGNOSIS — Z9889 Other specified postprocedural states: Secondary | ICD-10-CM

## 2023-02-03 ENCOUNTER — Other Ambulatory Visit: Payer: Self-pay | Admitting: Internal Medicine

## 2023-02-03 DIAGNOSIS — G459 Transient cerebral ischemic attack, unspecified: Secondary | ICD-10-CM

## 2023-02-03 DIAGNOSIS — E782 Mixed hyperlipidemia: Secondary | ICD-10-CM

## 2023-02-06 ENCOUNTER — Ambulatory Visit
Admission: RE | Admit: 2023-02-06 | Discharge: 2023-02-06 | Disposition: A | Discharge status: Home / Self Care | Payer: Self-pay | Source: Ambulatory Visit | Attending: Internal Medicine | Admitting: Internal Medicine

## 2023-02-06 DIAGNOSIS — E782 Mixed hyperlipidemia: Secondary | ICD-10-CM | POA: Insufficient documentation

## 2023-02-06 DIAGNOSIS — G459 Transient cerebral ischemic attack, unspecified: Secondary | ICD-10-CM | POA: Insufficient documentation

## 2023-03-13 ENCOUNTER — Other Ambulatory Visit: Payer: Self-pay | Admitting: Obstetrics and Gynecology

## 2023-03-13 DIAGNOSIS — Z9889 Other specified postprocedural states: Secondary | ICD-10-CM

## 2023-03-17 ENCOUNTER — Other Ambulatory Visit (INDEPENDENT_AMBULATORY_CARE_PROVIDER_SITE_OTHER): Payer: Commercial Managed Care - PPO

## 2023-03-17 DIAGNOSIS — G459 Transient cerebral ischemic attack, unspecified: Secondary | ICD-10-CM

## 2023-03-17 DIAGNOSIS — E559 Vitamin D deficiency, unspecified: Secondary | ICD-10-CM | POA: Diagnosis not present

## 2023-03-17 LAB — COMPREHENSIVE METABOLIC PANEL
ALT: 20 U/L (ref 0–35)
AST: 19 U/L (ref 0–37)
Albumin: 4.2 g/dL (ref 3.5–5.2)
Alkaline Phosphatase: 51 U/L (ref 39–117)
BUN: 19 mg/dL (ref 6–23)
CO2: 29 mEq/L (ref 19–32)
Calcium: 9.2 mg/dL (ref 8.4–10.5)
Chloride: 101 mEq/L (ref 96–112)
Creatinine, Ser: 1.16 mg/dL (ref 0.40–1.20)
GFR: 52.25 mL/min — ABNORMAL LOW (ref >60.00)
Glucose, Bld: 81 mg/dL (ref 70–99)
Potassium: 3.8 mEq/L (ref 3.5–5.1)
Sodium: 137 mEq/L (ref 135–145)
Total Bilirubin: 0.4 mg/dL (ref 0.2–1.2)
Total Protein: 7.2 g/dL (ref 6.0–8.3)

## 2023-03-17 LAB — LIPID PANEL
Cholesterol: 128 mg/dL (ref 0–200)
HDL: 50.8 mg/dL (ref >39.00)
LDL Cholesterol: 56 mg/dL (ref 0–99)
NonHDL: 76.83
Total CHOL/HDL Ratio: 3
Triglycerides: 104 mg/dL (ref 0.0–149.0)
VLDL: 20.8 mg/dL (ref 0.0–40.0)

## 2023-03-17 LAB — B12 AND FOLATE PANEL
Folate: >23.8 ng/mL (ref >5.9)
Vitamin B-12: 624 pg/mL (ref 211–911)

## 2023-03-17 LAB — VITAMIN D 25 HYDROXY (VIT D DEFICIENCY, FRACTURES): VITD: 42.75 ng/mL (ref 30.00–100.00)

## 2023-03-21 ENCOUNTER — Encounter: Payer: Self-pay | Admitting: Obstetrics and Gynecology

## 2023-03-21 ENCOUNTER — Ambulatory Visit (INDEPENDENT_AMBULATORY_CARE_PROVIDER_SITE_OTHER): Payer: Commercial Managed Care - PPO | Admitting: Obstetrics and Gynecology

## 2023-03-21 VITALS — BP 164/84 | HR 90 | Ht 64.0 in | Wt 134.8 lb

## 2023-03-21 DIAGNOSIS — N951 Menopausal and female climacteric states: Secondary | ICD-10-CM

## 2023-03-21 DIAGNOSIS — Z9889 Other specified postprocedural states: Secondary | ICD-10-CM

## 2023-03-21 MED ORDER — ESTRADIOL 1 MG PO TABS: 1.0000 mg | ORAL_TABLET | Freq: Every day | ORAL | 3 refills | Status: DC

## 2023-03-21 NOTE — Progress Notes (Signed)
HPI:      [name] is a 57 y.o. [phone] who LMP was No LMP recorded. Patient has had a hysterectomy.  Subjective:   She presents today she reports today 5 months from anterior pair with TOT.  She reports no urine loss and says and there are no other vaginal issues.  She has resumed all activities including intercourse and exercise without issue.  Her concern is that she continues to gain weight despite exercising and watching her diet. She is using estrogen but does not like the patch and would like to switch to an oral formulation.    Hx: The following portions of the patient's history were reviewed and updated as appropriate:             She  has a past medical history of cervical dysplasia, Climacteric, GERD (gastroesophageal reflux disease), Hypertension, Melanoma ([facility]), Motion sickness, Pelvic adhesions, and Recurrent cystitis. She does not have any pertinent problems on file. She  has a past surgical history that includes Cholecystectomy (06/19/2012); Melanoma excision (06/2012); Excision basal cell carcinoma (2013); Breast cyst aspiration (01/2010); Ovary surgery (1997); Abdominal hysterectomy (2000); Liposuction; Exploratory laparotomy; Rotator cuff repair (Left); Colonoscopy with propofol (N/A, 07/06/2015); Parathyroidectomy (04/14/2017); Cystocele repair (N/A, 10/17/2022); and Pubovaginal sling (N/A, 10/17/2022). Her family history includes CVA (age of onset: 45) in her mother; Heart attack (age of onset: 24) in her maternal grandfather; Hypertension in her father and mother; Lymphoma in her mother. She  reports that she has never smoked. She has never been exposed to tobacco smoke. She has never used smokeless tobacco. She reports that she does not drink alcohol and does not use drugs. She has a current medication list which includes the following prescription(s): aspirin ec, atorvastatin, estradiol, fenofibrate, losartan, zolpidem, and hydralazine. She is allergic to sulfa  antibiotics, penicillins, and tape.       Review of Systems:  Review of Systems  Constitutional: Denied constitutional symptoms, night sweats, recent illness, fatigue, fever, insomnia and weight loss.  Eyes: Denied eye symptoms, eye pain, photophobia, vision change and visual disturbance.  Ears/Nose/Throat/Neck: Denied ear, nose, throat or neck symptoms, hearing loss, nasal discharge, sinus congestion and sore throat.  Cardiovascular: Denied cardiovascular symptoms, arrhythmia, chest pain/pressure, edema, exercise intolerance, orthopnea and palpitations.  Respiratory: Denied pulmonary symptoms, asthma, pleuritic pain, productive sputum, cough, dyspnea and wheezing.  Gastrointestinal: Denied, gastro-esophageal reflux, melena, nausea and vomiting.  Genitourinary: Denied genitourinary symptoms including symptomatic vaginal discharge, pelvic relaxation issues, and urinary complaints.  Musculoskeletal: Denied musculoskeletal symptoms, stiffness, swelling, muscle weakness and myalgia.  Dermatologic: Denied dermatology symptoms, rash and scar.  Neurologic: Denied neurology symptoms, dizziness, headache, neck pain and syncope.  Psychiatric: Denied psychiatric symptoms, anxiety and depression.  Endocrine: Denied endocrine symptoms including hot flashes and night sweats.   Meds:   Current Outpatient Medications on File Prior to Visit  Medication Sig Dispense Refill   aspirin EC 81 MG tablet Take 1 tablet (81 mg total) by mouth daily. Swallow whole. 90 tablet 3   atorvastatin (LIPITOR) 80 MG tablet Take 1 tablet (80 mg total) by mouth daily. 30 tablet 5   fenofibrate 54 MG tablet Take 1 tablet (54 mg total) by mouth daily. 30 tablet 5   losartan (COZAAR) 100 MG tablet TAKE 1/2 TABLET BY MOUTH EVERY DAY (Patient taking differently: Take 50 mg by mouth 2 (two) times daily.) 45 tablet 1   zolpidem (AMBIEN) 10 MG tablet TAKE 1 TABLET (10 MG TOTAL) BY MOUTH AT BEDTIME AS  NEEDED. FOR SLEEP 30 tablet 4    hydrALAZINE (APRESOLINE) 50 MG tablet Take 1 tablet (50 mg total) by mouth 3 (three) times daily as needed (If systolic BP greater than 150 mmHg.). If systolic BP greater than 150 mmHg. 90 tablet 0   No current facility-administered medications on file prior to visit.      Objective:     Vitals:   03/21/23 0931  BP: (!) 164/84  Pulse: 90   Filed Weights   03/21/23 0931  Weight: 134 lb 12.8 oz (61.1 kg)                        Assessment:    [id number] Patient Active Problem List   Diagnosis Date Noted   B12 deficiency 12/20/2022   Altered mental status 12/14/2022   TIA (transient ischemic attack) 12/12/2022   Depression with anxiety 12/12/2022   Incomplete tear of rotator cuff 09/11/2019   Shoulder pain 09/11/2019   Stiffness of shoulder joint 09/11/2019   Strain of rotator cuff capsule 09/11/2019   Disorder of bursae of shoulder region 09/11/2019   Status post lumbar discectomy 05/07/2019   Hand weakness 06/26/2018   Numbness and tingling 05/30/2018   Bilateral hand pain 04/03/2018   Diplopia 04/03/2018   Numbness and tingling in both hands 04/03/2018   Encounter to establish care    Perimenopausal vasomotor symptoms 06/10/2015   Status post hysterectomy 06/10/2015   History of cervical dysplasia 06/10/2015   History of oophorectomy, unilateral 06/10/2015   Cholelithiasis 03/31/2015   Melanoma ([facility]) 03/31/2015   Hx of abnormal cervical Pap smear 03/31/2015   Tinea versicolor 03/31/2015   Vitamin D deficiency 03/31/2015   Hyperlipemia 03/31/2015   Depression 03/31/2015   Acute anxiety 03/31/2015   Insomnia 03/31/2015   Hypertension 03/31/2015   Plantar fasciitis 03/31/2015   Frequent UTI 03/31/2015   Discoid eczema 03/31/2015     1. Postoperative state   2. Symptomatic menopausal or female climacteric states     Patient with excellent recovery postop.   Plan:            1.  Menopause and metabolism changes discussed.  Weight gain discussed.  Caloric  restriction versus exercise discussed.  All questions answered.  2.  Change from patch to daily pil for ERT. Orders No orders of the defined types were placed in this encounter.    Meds ordered this encounter  Medications   estradiol (ESTRACE) 1 MG tablet    Sig: Take 1 tablet (1 mg total) by mouth daily.    Dispense:  90 tablet    Refill:  3      F/U  Return in about 7 months (around 10/21/2023) for Annual Physical.  Elonda Husky, M.D. 03/21/2023 9:54 AM

## 2023-03-21 NOTE — Progress Notes (Signed)
Patient presents for 5 month postop follow-up following anterior repair with TOT sling. She states doing well, no complaints from surgery. She reports currently using a Climara patch once weekly, she states liking the oral pills better and would like to change.

## 2023-03-22 ENCOUNTER — Ambulatory Visit (INDEPENDENT_AMBULATORY_CARE_PROVIDER_SITE_OTHER): Payer: Commercial Managed Care - PPO | Admitting: Family

## 2023-03-22 ENCOUNTER — Encounter: Payer: Self-pay | Admitting: Family

## 2023-03-22 VITALS — BP 128/80 | HR 80 | Temp 97.8°F | Ht 64.0 in | Wt 133.8 lb

## 2023-03-22 DIAGNOSIS — G459 Transient cerebral ischemic attack, unspecified: Secondary | ICD-10-CM

## 2023-03-22 DIAGNOSIS — R635 Abnormal weight gain: Secondary | ICD-10-CM | POA: Diagnosis not present

## 2023-03-22 DIAGNOSIS — G4700 Insomnia, unspecified: Secondary | ICD-10-CM

## 2023-03-22 DIAGNOSIS — I10 Essential (primary) hypertension: Secondary | ICD-10-CM

## 2023-03-22 MED ORDER — ATORVASTATIN CALCIUM 80 MG PO TABS
80.0000 mg | ORAL_TABLET | Freq: Every day | ORAL | 3 refills | Status: DC
Start: 2023-03-22 — End: 2024-04-09

## 2023-03-22 MED ORDER — LOSARTAN POTASSIUM 50 MG PO TABS
50.0000 mg | ORAL_TABLET | Freq: Every day | ORAL | 3 refills | Status: DC
Start: 2023-03-22 — End: 2023-06-23

## 2023-03-22 MED ORDER — LOSARTAN POTASSIUM 50 MG PO TABS
50.0000 mg | ORAL_TABLET | Freq: Every day | ORAL | 3 refills | Status: DC
Start: 2023-03-22 — End: 2023-03-22

## 2023-03-22 MED ORDER — FENOFIBRATE 54 MG PO TABS
54.0000 mg | ORAL_TABLET | Freq: Every day | ORAL | 3 refills | Status: DC
Start: 2023-03-22 — End: 2024-01-08

## 2023-03-22 NOTE — Assessment & Plan Note (Signed)
Chronic, stable.  Continue losartan 50mg  daily, metoprolol tartrate 25 mg twice daily.  She has not had to use hydralazine prn

## 2023-03-22 NOTE — Assessment & Plan Note (Signed)
Counseled patient on low glycemic diet, creating caloric deficit.  Advised to be vary exercises.  Will follow

## 2023-03-22 NOTE — Progress Notes (Signed)
Assessment & Plan:  TIA (transient ischemic attack) -     Atorvastatin Calcium; Take 1 tablet (80 mg total) by mouth daily.  Dispense: 90 tablet; Refill: 3 -     Fenofibrate; Take 1 tablet (54 mg total) by mouth daily.  Dispense: 90 tablet; Refill: 3  Essential hypertension -     Losartan Potassium; Take 1 tablet (50 mg total) by mouth daily.  Dispense: 90 tablet; Refill: 3  Insomnia, unspecified type Assessment & Plan: Chronic, stable.  Continue Ambien 10 mg qpm   Weight gain Assessment & Plan: Counseled patient on low glycemic diet, creating caloric deficit.  Advised to be vary exercises.  Will follow   Primary hypertension Assessment & Plan: Chronic, stable.  Continue losartan 50mg  daily, metoprolol tartrate 25 mg twice daily.  She has not had to use hydralazine prn      Return precautions given.   Risks, benefits, and alternatives of the medications and treatment plan prescribed today were discussed, and patient expressed understanding.   Education regarding symptom management and diagnosis given to patient on AVS either electronically or printed.  Return in about 3 months (around 06/22/2023).  Rennie Plowman, FNP  Subjective:    Patient ID: Heavan Durnil, female    DOB: 25-Oct-1965, 57 y.o.   MRN: [id number]  CC: Dorina Cearra Norwick is a 57 y.o. female who presents today for follow up.   HPI: Overall feels well today.  She is somewhat frustrated by weight gain. Over the past year she has gained 8 pounds.  Her ideal weight is 125 pounds where he she has been for a long time.   She eats a very healthy diet and no changes in her diet.  For lunch and dinner, she is eating grilled meats and vegetables such as grilled chicken or plain baked potato.   She may skip breakfast or maybe dried cheerios.   No soda.   She has recently started jogging and does weights for upper body daily.   She is compliant with losartan 50 mg daily, metoprolol 25 mg twice daily.  She has not  had to use hydralazine  She uses Ambien 10mg  nightly     Follow-up Dr. Logan Bores 03/21/2023.  Change from estrogen patch to oral formulation S/p tension free vaginal tape using the trans-obturator approach for cystocele with rectocele 10/17/22 Consult with Dr Sherryll Burger 01/24/23; continued on atorvastatin, aspirin.  Ordered home sleep study.  Referral to cardiology for evaluation paroxysmal atrial fibrillation Consult Dr. Juliann Pares 02/03/2023; recommended event monitor for evaluation of any high-grade arrhythmia.  Recommended  CT calcium score.  Reducing losartan to 50 mg daily and adding metoprolol 25 mg daily.  Allergies: Sulfa antibiotics, Penicillins, and Tape Current Outpatient Medications on File Prior to Visit  Medication Sig Dispense Refill   aspirin EC 81 MG tablet Take 1 tablet (81 mg total) by mouth daily. Swallow whole. 90 tablet 3   estradiol (ESTRACE) 1 MG tablet Take 1 tablet (1 mg total) by mouth daily. 90 tablet 3   metoprolol tartrate (LOPRESSOR) 25 MG tablet Take 25 mg by mouth 2 (two) times daily. Pt takes one 25 mg tablet daily     zolpidem (AMBIEN) 10 MG tablet TAKE 1 TABLET (10 MG TOTAL) BY MOUTH AT BEDTIME AS NEEDED. FOR SLEEP 30 tablet 4   hydrALAZINE (APRESOLINE) 50 MG tablet Take 1 tablet (50 mg total) by mouth 3 (three) times daily as needed (If systolic BP greater than 150 mmHg.). If systolic BP greater  than 150 mmHg. 90 tablet 0   No current facility-administered medications on file prior to visit.    Review of Systems  Constitutional:  Negative for chills and fever.  Respiratory:  Negative for cough.   Cardiovascular:  Negative for chest pain and palpitations.  Gastrointestinal:  Negative for nausea and vomiting.      Objective:    BP 128/80   Pulse 80   Temp 97.8 F (36.6 C) (Oral)   Ht 5\' 4"  (1.626 m)   Wt 133 lb 12.8 oz (60.7 kg)   SpO2 97%   BMI 22.97 kg/m  BP Readings from Last 3 Encounters:  03/22/23 128/80  03/21/23 (!) 164/84  12/20/22 132/82   Wt  Readings from Last 3 Encounters:  03/22/23 133 lb 12.8 oz (60.7 kg)  03/21/23 134 lb 12.8 oz (61.1 kg)  12/20/22 129 lb 12.8 oz (58.9 kg)    Physical Exam Vitals reviewed.  Constitutional:      Appearance: She is well-developed.  Eyes:     Conjunctiva/sclera: Conjunctivae normal.  Cardiovascular:     Rate and Rhythm: Normal rate and regular rhythm.     Pulses: Normal pulses.     Heart sounds: Normal heart sounds.  Pulmonary:     Effort: Pulmonary effort is normal.     Breath sounds: Normal breath sounds. No wheezing, rhonchi or rales.  Skin:    General: Skin is warm and dry.  Neurological:     Mental Status: She is alert.  Psychiatric:        Speech: Speech normal.        Behavior: Behavior normal.        Thought Content: Thought content normal.

## 2023-03-22 NOTE — Assessment & Plan Note (Signed)
Chronic, stable.  Continue Ambien 10 mg qpm.  

## 2023-03-22 NOTE — Patient Instructions (Addendum)
For post menopausal women, guidelines recommend a diet with 1200 mg of Calcium per day. If you are eating calcium rich foods, you do not need a calcium supplement. The body better absorbs the calcium that you eat over supplementation. If you do supplement, I recommend not supplementing the full 1200 mg/ day as this can lead to increased risk of cardiovascular disease. I recommend Calcium Citrate over the counter, and you may take a total of 600 to 800 mg per day in divided doses with meals for best absorption.   For bone health, you need adequate vitamin D, and I recommend you supplement as it is harder to do so with diet alone. I recommend cholecalciferol 800 units daily.  Also, please ensure you are following a diet high in calcium -- research shows better outcomes with dietary sources including kale, yogurt, broccolii, cheese, okra, almonds- to name a few.     Also remember that exercise is a great medicine for maintain and preserve bone health. Advise moderate exercise for 30 minutes , 3 times per week.   Please download Myfitness Pal App ( free). You may log every thing you eat for even 2-3 days to get a better of idea of true daily calories. To loose weight, you have to use more calories than than consumed and essentially create caloric deficit to loose weight. The goal is 1-2 lbs per week of weight loss.   You review below from [facility].   https://[email or web address].it  Calorie counting made easy  Eat less, exercise more. If only it were that simple! As most dieters know, losing weight can be very challenging. As this report details, a range of influences can affect how people gain and lose weight. But a basic understanding of how to tip your energy balance in favor of weight loss is a good place to start.  Start by determining how many calories you should consume each day. To do so, you need to know how many calories you need to maintain  your current weight. Doing this requires a few simple calculations.  First, multiply your current weight by 15 -- that's roughly the number of calories per pound of body weight needed to maintain your current weight if you are moderately active. Moderately active means getting at least 30 minutes of physical activity a day in the form of exercise (walking at a brisk pace, climbing stairs, or active gardening). Let's say you're a woman who is 5 feet, 4 inches tall and weighs 155 pounds, and you need to lose about 15 pounds to put you in a healthy weight range. If you multiply 155 by 15, you will get 2,325, which is the number of calories per day that you need in order to maintain your current weight (weight-maintenance calories). To lose weight, you will need to get below that total.  For example, to lose 1 to 2 pounds a week -- a rate that experts consider safe -- your food consumption should provide 500 to 1,000 calories less than your total weight-maintenance calories. If you need 2,325 calories a day to maintain your current weight, reduce your daily calories to between 1,325 and 1,825. If you are sedentary, you will also need to build more activity into your day. In order to lose at least a pound a week, try to do at least 30 minutes of physical activity on most days, and reduce your daily calorie intake by at least 500 calories. However, calorie intake should not fall below 1,200 a day  in women or 1,500 a day in men, except under the supervision of a health professional. Eating too few calories can endanger your health by depriving you of needed nutrients.  Meeting your calorie target How can you meet your daily calorie target? One approach is to add up the number of calories per serving of all the foods that you eat, and then plan your menus accordingly. You can buy books that list calories per serving for many foods. In addition, the nutrition labels on all packaged foods and beverages provide calories  per serving information. Make a point of reading the labels of the foods and drinks you use, noting the number of calories and the serving sizes. Many recipes published in cookbooks, newspapers, and magazines provide similar information.  If you hate counting calories, a different approach is to restrict how much and how often you eat, and to eat meals that are low in calories. Dietary guidelines issued by the American Heart Association stress common sense in choosing your foods rather than focusing strictly on numbers, such as total calories or calories from fat. Whichever method you choose, research shows that a regular eating schedule -- with meals and snacks planned for certain times each day -- makes for the most successful approach. The same applies after you have lost weight and want to keep it off. Sticking with an eating schedule increases your chance of maintaining your new weight.    This is  Dr. Melina Schools  ( an amazing physician in my office!)  example of a  "Low GI"  Diet:  It will allow you to lose 4 to 8  lbs  per month if you follow it carefully.  Your goal with exercise is a minimum of 30 minutes of aerobic exercise 5 days per week (Walking does not count once it becomes easy!)    All of the foods can be found at grocery stores and in bulk at [employer].  The Atkins protein bars and shakes are available in more varieties at Target, [employer] and [employer] Foods.     7 AM Breakfast:  Choose from the following:  Low carbohydrate Protein  Shakes (I recommend the  Premier Protein chocolate shakes,  EAS AdvantEdge "Carb Control" shakes  Or the Atkins shakes all are under 3 net carbs)     a scrambled egg/bacon/cheese burrito made with Mission's "carb balance" whole wheat tortilla  (about 10 net carbs )  Medical laboratory scientific officer (basically a quiche without the pastry crust) that is eaten cold and very convenient way to get your eggs.  8 carbs)  If you make your own protein shakes,  avoid bananas and pineapple,  And use low carb greek yogurt or original /unsweetened almond or soy milk    Avoid cereal and bananas, oatmeal and cream of wheat and grits. They are loaded with carbohydrates!   10 AM: high protein snack:  Protein bar by Atkins (the snack size, under 200 cal, usually < 6 net carbs).    A stick of cheese:  Around 1 carb,  100 cal     Dannon Light n Fit [place] Yogurt  (80 cal, 8 carbs)  Other so called "protein bars" and Greek yogurts tend to be loaded with carbohydrates.  Remember, in food advertising, the word "energy" is synonymous for " carbohydrate."  Lunch:   A Sandwich using the bread choices listed, Can use any  Eggs,  lunchmeat, grilled meat or canned tuna), avocado, regular mayo/mustard  and  cheese.  A Salad using blue cheese, ranch,  Goddess or vinagrette,  Avoid taco shells, croutons or "confetti" and no "candied nuts" but regular nuts OK.   No pretzels, nabs  or chips.  Pickles and miniature sweet peppers are a good low carb alternative that provide a "crunch"  The bread is the only source of carbohydrate in a sandwich and  can be decreased by trying some of the attached alternatives to traditional loaf bread   Avoid "Low fat dressings, as well as Reyne Dumas and [employer] dressings They are loaded with sugar!   3 PM/ Mid day  Snack:  Consider  1 ounce of  almonds, walnuts, pistachios, pecans, peanuts,  Macadamia nuts or a nut medley.  Avoid "granola and granola bars "  Mixed nuts are ok in moderation as long as there are no raisins,  cranberries or dried fruit.   KIND bars are OK if you get the low glycemic index variety   Try the prosciutto/mozzarella cheese sticks by [employer]  In deli /backery section   High protein      6 PM  Dinner:     Meat/fowl/fish with a green salad, and either broccoli, cauliflower, green beans, spinach, brussel sprouts or  Lima beans. DO NOT BREAD THE PROTEIN!!      There is a low carb pasta by Dreamfield's that is  acceptable and tastes great: only 5 digestible carbs/serving.( All grocery stores but BJs carry it ) Several ready made meals are available low carb:   Try Michel Angelo's chicken piccata or chicken or eggplant parm over low carb pasta.(Lowes and BJs)   Clifton Custard Sanchez's "Carnitas" (pulled pork, no sauce,  0 carbs) or his beef pot roast to make a dinner burrito (at BJ's)  Pesto over low carb pasta (bj's sells a good quality pesto in the center refrigerated section of the deli   Try satueeing  Roosvelt Harps with mushroooms as a good side   [employer] Giant makes a mashed cauliflower that tastes like mashed potatoes  Whole wheat pasta is still full of digestible carbs and  Not as low in glycemic index as Dreamfield's.   Brown rice is still rice,  So skip the rice and noodles if you eat [place] or [place] (or at least limit to 1/2 cup)  9 PM snack :   Breyer's "low carb" fudgsicle or  ice cream bar (Carb Smart line), or  Weight Watcher's ice cream bar , or another "no sugar added" ice cream;  a serving of fresh berries/cherries with whipped cream   Cheese or DANNON'S LlGHT N FIT GREEK YOGURT  8 ounces of [place] unsweetened almond/cococunut milk    Treat yourself to a parfait made with whipped cream blueberiies, walnuts and vanilla greek yogurt  Avoid bananas, pineapple, grapes  and watermelon on a regular basis because they are high in sugar.  THINK OF THEM AS DESSERT  Remember that snack Substitutions should be less than 10 NET carbs per serving and meals < 20 carbs. Remember to subtract fiber grams to get the "net carbs."

## 2023-04-07 ENCOUNTER — Other Ambulatory Visit: Payer: Self-pay | Admitting: Obstetrics and Gynecology

## 2023-04-07 DIAGNOSIS — Z9889 Other specified postprocedural states: Secondary | ICD-10-CM

## 2023-05-18 ENCOUNTER — Encounter (INDEPENDENT_AMBULATORY_CARE_PROVIDER_SITE_OTHER): Payer: Self-pay

## 2023-06-06 ENCOUNTER — Other Ambulatory Visit: Payer: Self-pay | Admitting: Medical Genetics

## 2023-06-06 DIAGNOSIS — Z006 Encounter for examination for normal comparison and control in clinical research program: Secondary | ICD-10-CM

## 2023-06-23 ENCOUNTER — Encounter: Payer: Self-pay | Admitting: Family

## 2023-06-23 ENCOUNTER — Ambulatory Visit (INDEPENDENT_AMBULATORY_CARE_PROVIDER_SITE_OTHER): Payer: Commercial Managed Care - PPO | Admitting: Family

## 2023-06-23 ENCOUNTER — Ambulatory Visit: Payer: Commercial Managed Care - PPO

## 2023-06-23 VITALS — BP 140/86 | HR 96 | Temp 98.1°F | Ht 64.0 in | Wt 135.6 lb

## 2023-06-23 DIAGNOSIS — G459 Transient cerebral ischemic attack, unspecified: Secondary | ICD-10-CM | POA: Diagnosis not present

## 2023-06-23 DIAGNOSIS — M79642 Pain in left hand: Secondary | ICD-10-CM

## 2023-06-23 DIAGNOSIS — I10 Essential (primary) hypertension: Secondary | ICD-10-CM

## 2023-06-23 DIAGNOSIS — G4700 Insomnia, unspecified: Secondary | ICD-10-CM

## 2023-06-23 DIAGNOSIS — M79641 Pain in right hand: Secondary | ICD-10-CM

## 2023-06-23 LAB — SEDIMENTATION RATE: Sed Rate: 5 mm/hr (ref 0–30)

## 2023-06-23 LAB — C-REACTIVE PROTEIN: CRP: <1 mg/dL (ref 0.5–20.0)

## 2023-06-23 LAB — URIC ACID: Uric Acid, Serum: 3.8 mg/dL (ref 2.4–7.0)

## 2023-06-23 MED ORDER — LOSARTAN POTASSIUM 50 MG PO TABS
75.0000 mg | ORAL_TABLET | Freq: Every day | ORAL | 3 refills | Status: DC
Start: 2023-06-23 — End: 2024-01-08

## 2023-06-23 MED ORDER — ZOLPIDEM TARTRATE 10 MG PO TABS: 10.0000 mg | ORAL_TABLET | Freq: Every evening | ORAL | 2 refills | Status: DC | PRN

## 2023-06-23 NOTE — Progress Notes (Signed)
Assessment & Plan:  TIA (transient ischemic attack)  Essential hypertension -     Losartan Potassium; Take 1.5 tablets (75 mg total) by mouth daily.  Dispense: 90 tablet; Refill: 3 -     Basic metabolic panel; Future  Insomnia, unspecified type Assessment & Plan: Chronic, stable.  Continue Ambien 10 mg qpm. Refilled today.   Orders: -     Zolpidem Tartrate; Take 1 tablet (10 mg total) by mouth at bedtime as needed for sleep.  Dispense: 30 tablet; Refill: 2  Bilateral hand pain Assessment & Plan: Bilateral stiffness.  No gross symptoms today to suggest RA however patient has a strong family history of autoimmune disease on her mother side.  Pending labs, x-rays or further distinction between osteoarthritis versus rheumatoid arthritis.  For now, patient will continue with compression gloves, and she will start over-the-counter Voltaren gel.  Orders: -     DG Hand Complete Left; Future -     DG Hand Complete Right; Future -     ANA -     C-reactive protein -     CYCLIC CITRUL PEPTIDE ANTIBODY, IGG/IGA -     Rheumatoid factor -     Sedimentation rate -     Uric acid  Primary hypertension Assessment & Plan: Suboptimal control.  Discussed history of TIA, and optimal blood pressure closer to 130/80 or more aggressively 120/80.  Increase losartan to 75 mg daily.  Continue metoprolol tartrate 25 mg twice daily .labs in 1 week.      Return precautions given.   Risks, benefits, and alternatives of the medications and treatment plan prescribed today were discussed, and patient expressed understanding.   Education regarding symptom management and diagnosis given to patient on AVS either electronically or printed.  Return in about 3 months (around 09/22/2023).  Rennie Plowman, FNP  Subjective:    Patient ID: Jill Porter, Jill Porter    DOB: [birth year], 57 y.o.   MRN: [id number]  CC: Jill Porter is a 57 y.o. Jill Porter who presents today for follow up.   HPI: Left index finger  painful 'knot' x 3 months   No triggering.   Diffuse stiffness and swelling bilateral hands.   Mother has RA and SLE   Wearing compression gloves with relief     Compliant with losartan 50mg  daily, metoprolol tartrate 25 mg twice daily.  She has never taken hydralazine prn. BP at home 140/80.      Following with [facility] neurology, last seen 05/29/23 for ho TIA Setting of  snoring, home sleep study has been ordered  Scheduled to see Dr. Juliann Pares in December.  Sleep study is scheduled for October She is doing well on Ambien.  She request a refill today.  Allergies: Sulfa antibiotics, Penicillins, Silicone, and Tape Current Outpatient Medications on File Prior to Visit  Medication Sig Dispense Refill   aspirin EC 81 MG tablet Take 1 tablet (81 mg total) by mouth daily. Swallow whole. 90 tablet 3   atorvastatin (LIPITOR) 80 MG tablet Take 1 tablet (80 mg total) by mouth daily. 90 tablet 3   Calcium Carbonate-Vit D-Min (CALCIUM 1200 PO) Take by mouth.     Cholecalciferol (VITAMIN D3) 20 MCG (800 UNIT) TABS Take by mouth.     cyanocobalamin (VITAMIN B12) 1000 MCG tablet Take 1,000 mcg by mouth daily.     estradiol (ESTRACE) 1 MG tablet Take 1 tablet (1 mg total) by mouth daily. 90 tablet 3   fenofibrate 54 MG tablet Take  1 tablet (54 mg total) by mouth daily. 90 tablet 3   Magnesium 400 MG TABS Take 420 mg by mouth.     metoprolol tartrate (LOPRESSOR) 25 MG tablet Take 25 mg by mouth 2 (two) times daily. Pt takes one 25 mg tablet daily     hydrALAZINE (APRESOLINE) 50 MG tablet Take 1 tablet (50 mg total) by mouth 3 (three) times daily as needed (If systolic BP greater than 150 mmHg.). If systolic BP greater than 150 mmHg. 90 tablet 0   No current facility-administered medications on file prior to visit.    Review of Systems  Constitutional:  Negative for chills and fever.  Respiratory:  Negative for cough.   Cardiovascular:  Negative for chest pain and palpitations.   Gastrointestinal:  Negative for nausea and vomiting.  Musculoskeletal:  Positive for arthralgias.  Neurological:  Negative for headaches.      Objective:    BP (!) 140/86   Pulse 96   Temp 98.1 F (36.7 C) (Oral)   Ht 5\' 4"  (1.626 m)   Wt 135 lb 9.6 oz (61.5 kg)   SpO2 99%   BMI 23.28 kg/m  BP Readings from Last 3 Encounters:  06/23/23 (!) 140/86  03/22/23 128/80  03/21/23 (!) 164/84   Wt Readings from Last 3 Encounters:  06/23/23 135 lb 9.6 oz (61.5 kg)  03/22/23 133 lb 12.8 oz (60.7 kg)  03/21/23 134 lb 12.8 oz (61.1 kg)    Physical Exam Vitals reviewed.  Constitutional:      Appearance: She is well-developed.  Eyes:     Conjunctiva/sclera: Conjunctivae normal.  Cardiovascular:     Rate and Rhythm: Normal rate and regular rhythm.     Pulses: Normal pulses.     Heart sounds: Normal heart sounds.  Pulmonary:     Effort: Pulmonary effort is normal.     Breath sounds: Normal breath sounds. No wheezing, rhonchi or rales.  Musculoskeletal:     Right hand: Normal. No swelling or bony tenderness. Normal range of motion. Normal pulse.     Left hand: Normal. No swelling, deformity or bony tenderness. Normal range of motion. Normal pulse.       Hands:     Comments: Left 1st MCP ventral aspect tenderness with palpation and questionable mass, marked on diagram. No triggering.  No increased warmth, erythema bilateral hands over MCP, PIP or DIP joints. No Heberden's or Bouchard's nodes.   Skin:    General: Skin is warm and dry.  Neurological:     Mental Status: She is alert.  Psychiatric:        Speech: Speech normal.        Behavior: Behavior normal.        Thought Content: Thought content normal.

## 2023-06-23 NOTE — Assessment & Plan Note (Signed)
Bilateral stiffness.  No gross symptoms today to suggest RA however patient has a strong family history of autoimmune disease on her mother side.  Pending labs, x-rays or further distinction between osteoarthritis versus rheumatoid arthritis.  For now, patient will continue with compression gloves, and she will start over-the-counter Voltaren gel.

## 2023-06-23 NOTE — Assessment & Plan Note (Signed)
Chronic, stable.  Continue Ambien 10 mg qpm. Refilled today.

## 2023-06-23 NOTE — Patient Instructions (Addendum)
Pending evaluation for rheumatoid arthritis versus osteoarthritis  Start voltaren gel over the counter for hand pain   Increase losartan to 75 mg daily.  Monitor blood pressure at home and me 5-6 reading on separate days. Goal is less than 120/80, based on newest guidelines, however we certainly want to be less than 130/80;  if persistently higher, please make sooner follow up appointment so we can recheck you blood pressure and manage/ adjust medications.   Nice to see you!

## 2023-06-23 NOTE — Assessment & Plan Note (Addendum)
Suboptimal control.  Discussed history of TIA, and optimal blood pressure closer to 130/80 or more aggressively 120/80.  Increase losartan to 75 mg daily.  Continue metoprolol tartrate 25 mg twice daily .labs in 1 week.

## 2023-06-25 LAB — RHEUMATOID FACTOR: Rheumatoid fact SerPl-aCnc: <10 IU/mL (ref <14)

## 2023-06-25 LAB — ANA: Anti Nuclear Antibody (ANA): NEGATIVE

## 2023-06-27 LAB — CYCLIC CITRUL PEPTIDE ANTIBODY, IGG/IGA: Cyclic Citrullin Peptide Ab: 5 units (ref 0–19)

## 2023-06-30 ENCOUNTER — Other Ambulatory Visit (INDEPENDENT_AMBULATORY_CARE_PROVIDER_SITE_OTHER): Payer: Commercial Managed Care - PPO

## 2023-06-30 DIAGNOSIS — I10 Essential (primary) hypertension: Secondary | ICD-10-CM

## 2023-06-30 LAB — BASIC METABOLIC PANEL
BUN: 16 mg/dL (ref 6–23)
CO2: 28 meq/L (ref 19–32)
Calcium: 9.3 mg/dL (ref 8.4–10.5)
Chloride: 102 meq/L (ref 96–112)
Creatinine, Ser: 0.9 mg/dL (ref 0.40–1.20)
GFR: 70.71 mL/min (ref >60.00)
Glucose, Bld: 89 mg/dL (ref 70–99)
Potassium: 3.7 meq/L (ref 3.5–5.1)
Sodium: 139 meq/L (ref 135–145)

## 2023-07-21 ENCOUNTER — Other Ambulatory Visit: Payer: Self-pay | Admitting: Obstetrics and Gynecology

## 2023-07-21 DIAGNOSIS — Z9889 Other specified postprocedural states: Secondary | ICD-10-CM

## 2023-07-28 ENCOUNTER — Other Ambulatory Visit: Payer: Commercial Managed Care - PPO | Attending: Medical Genetics

## 2023-09-22 ENCOUNTER — Encounter: Payer: Self-pay | Admitting: Family

## 2023-09-22 ENCOUNTER — Ambulatory Visit (INDEPENDENT_AMBULATORY_CARE_PROVIDER_SITE_OTHER): Payer: Commercial Managed Care - PPO | Admitting: Family

## 2023-09-22 VITALS — BP 124/82 | HR 78 | Temp 98.2°F | Ht 64.0 in | Wt 132.8 lb

## 2023-09-22 DIAGNOSIS — G4700 Insomnia, unspecified: Secondary | ICD-10-CM

## 2023-09-22 DIAGNOSIS — I10 Essential (primary) hypertension: Secondary | ICD-10-CM

## 2023-09-22 MED ORDER — ZOLPIDEM TARTRATE 10 MG PO TABS: 10.0000 mg | ORAL_TABLET | Freq: Every evening | ORAL | 2 refills | Status: DC | PRN

## 2023-09-22 NOTE — Progress Notes (Unsigned)
   Assessment & Plan:  There are no diagnoses linked to this encounter.   Return precautions given.   Risks, benefits, and alternatives of the medications and treatment plan prescribed today were discussed, and patient expressed understanding.   Education regarding symptom management and diagnosis given to patient on AVS either electronically or printed.  No follow-ups on file.  Rennie Plowman, FNP  Subjective:    Patient ID: Jill Porter, female    DOB: [birth year], 57 y.o.   MRN: [id number]  CC: Jill Porter is a 57 y.o. female who presents today for follow up.   HPI: HPI  Increase losartan to 75 mg daily.  Continue metoprolol tartrate 25 mg twice daily  Allergies: Sulfa antibiotics, Penicillins, Silicone, and Tape Current Outpatient Medications on File Prior to Visit  Medication Sig Dispense Refill   aspirin EC 81 MG tablet Take 1 tablet (81 mg total) by mouth daily. Swallow whole. 90 tablet 3   Calcium Carbonate-Vit D-Min (CALCIUM 1200 PO) Take by mouth.     Cholecalciferol (VITAMIN D3) 20 MCG (800 UNIT) TABS Take by mouth.     cyanocobalamin (VITAMIN B12) 1000 MCG tablet Take 1,000 mcg by mouth daily.     estradiol (ESTRACE) 1 MG tablet Take 1 tablet (1 mg total) by mouth daily. 90 tablet 3   fenofibrate 54 MG tablet Take 1 tablet (54 mg total) by mouth daily. 90 tablet 3   losartan (COZAAR) 50 MG tablet Take 1.5 tablets (75 mg total) by mouth daily. 90 tablet 3   Magnesium 400 MG TABS Take 420 mg by mouth.     metoprolol tartrate (LOPRESSOR) 25 MG tablet Take 25 mg by mouth 2 (two) times daily. Pt takes one 25 mg tablet daily     zolpidem (AMBIEN) 10 MG tablet Take 1 tablet (10 mg total) by mouth at bedtime as needed for sleep. 30 tablet 2   atorvastatin (LIPITOR) 80 MG tablet Take 1 tablet (80 mg total) by mouth daily. 90 tablet 3   hydrALAZINE (APRESOLINE) 50 MG tablet Take 1 tablet (50 mg total) by mouth 3 (three) times daily as needed (If systolic BP greater than  150 mmHg.). If systolic BP greater than 150 mmHg. 90 tablet 0   No current facility-administered medications on file prior to visit.    Review of Systems    Objective:    BP 124/82   Pulse 78   Temp 98.2 F (36.8 C) (Oral)   Ht 5\' 4"  (1.626 m)   Wt 132 lb 12.8 oz (60.2 kg)   SpO2 98%   BMI 22.80 kg/m  BP Readings from Last 3 Encounters:  09/22/23 124/82  06/23/23 (!) 140/86  03/22/23 128/80   Wt Readings from Last 3 Encounters:  09/22/23 132 lb 12.8 oz (60.2 kg)  06/23/23 135 lb 9.6 oz (61.5 kg)  03/22/23 133 lb 12.8 oz (60.7 kg)    Physical Exam

## 2023-09-23 NOTE — Assessment & Plan Note (Signed)
Chronic, stable.  Continue Ambien 10 mg qpm. Refilled today.

## 2023-09-23 NOTE — Assessment & Plan Note (Signed)
Chronic, stable.  Continue losartan to 75 mg daily,metoprolol tartrate 25 mg twice daily

## 2023-10-21 ENCOUNTER — Other Ambulatory Visit: Payer: Self-pay | Admitting: Obstetrics and Gynecology

## 2023-10-21 DIAGNOSIS — Z9889 Other specified postprocedural states: Secondary | ICD-10-CM

## 2023-11-28 ENCOUNTER — Encounter: Payer: Self-pay | Admitting: Family

## 2023-11-28 LAB — HM MAMMOGRAPHY

## 2023-12-06 ENCOUNTER — Ambulatory Visit (INDEPENDENT_AMBULATORY_CARE_PROVIDER_SITE_OTHER): Payer: Commercial Managed Care - PPO | Admitting: Obstetrics and Gynecology

## 2023-12-06 ENCOUNTER — Encounter: Payer: Self-pay | Admitting: Obstetrics and Gynecology

## 2023-12-06 VITALS — BP 175/92 | HR 2 | Ht 64.0 in | Wt 134.3 lb

## 2023-12-06 DIAGNOSIS — N951 Menopausal and female climacteric states: Secondary | ICD-10-CM

## 2023-12-06 DIAGNOSIS — Z01419 Encounter for gynecological examination (general) (routine) without abnormal findings: Secondary | ICD-10-CM

## 2023-12-06 DIAGNOSIS — Z79890 Hormone replacement therapy (postmenopausal): Secondary | ICD-10-CM

## 2023-12-06 MED ORDER — ESTRADIOL 1 MG PO TABS: 1.0000 mg | ORAL_TABLET | Freq: Every day | ORAL | 3 refills | Status: AC

## 2023-12-06 NOTE — Progress Notes (Signed)
 Patients presents for annual exam today. She states doing well with current HRT, refill sent in. Up to date on mammogram, colonoscopy and labs. She states no other questions or concerns at this time.

## 2023-12-06 NOTE — Progress Notes (Signed)
 HPI:      [name] is a 58 y.o. [phone] who LMP was No LMP recorded. Patient has had a hysterectomy.  Subjective:   She presents today for her annual examination.  She continues on ERT.  She reports that she is not having any urine loss (history of TOT).  She is up-to-date on mammography. Of significant note she has had a hysterectomy.    Hx: The following portions of the patient's history were reviewed and updated as appropriate:             She  has a past medical history of cervical dysplasia, Climacteric, GERD (gastroesophageal reflux disease), Hypertension, Melanoma (HCC), Motion sickness, Pelvic adhesions, and Recurrent cystitis. She does not have any pertinent problems on file. She  has a past surgical history that includes Cholecystectomy (06/19/2012); Melanoma excision (06/2012); Excision basal cell carcinoma (2013); Breast cyst aspiration (01/2010); Ovary surgery (1997); Abdominal hysterectomy (2000); Liposuction; Exploratory laparotomy; Rotator cuff repair (Left); Colonoscopy with propofol (N/A, 07/06/2015); Parathyroidectomy (04/14/2017); Cystocele repair (N/A, 10/17/2022); and Pubovaginal sling (N/A, 10/17/2022). Her family history includes CVA (age of onset: 48) in her mother; Heart attack (age of onset: 77) in her maternal grandfather; Hypertension in her father and mother; Lupus in her mother; Lymphoma in her mother; Rheum arthritis in her mother. She  reports that she has never smoked. She has never been exposed to tobacco smoke. She has never used smokeless tobacco. She reports that she does not drink alcohol and does not use drugs. She has a current medication list which includes the following prescription(s): aspirin ec, calcium carbonate-vit d-min, vitamin d3, cyanocobalamin, fenofibrate, losartan, magnesium, metoprolol tartrate, zolpidem, atorvastatin, estradiol, and hydralazine. She is allergic to sulfa antibiotics, penicillins, silicone, and tape.       Review of  Systems:  Review of Systems  Constitutional: Denied constitutional symptoms, night sweats, recent illness, fatigue, fever, insomnia and weight loss.  Eyes: Denied eye symptoms, eye pain, photophobia, vision change and visual disturbance.  Ears/Nose/Throat/Neck: Denied ear, nose, throat or neck symptoms, hearing loss, nasal discharge, sinus congestion and sore throat.  Cardiovascular: Denied cardiovascular symptoms, arrhythmia, chest pain/pressure, edema, exercise intolerance, orthopnea and palpitations.  Respiratory: Denied pulmonary symptoms, asthma, pleuritic pain, productive sputum, cough, dyspnea and wheezing.  Gastrointestinal: Denied, gastro-esophageal reflux, melena, nausea and vomiting.  Genitourinary: Denied genitourinary symptoms including symptomatic vaginal discharge, pelvic relaxation issues, and urinary complaints.  Musculoskeletal: Denied musculoskeletal symptoms, stiffness, swelling, muscle weakness and myalgia.  Dermatologic: Denied dermatology symptoms, rash and scar.  Neurologic: Denied neurology symptoms, dizziness, headache, neck pain and syncope.  Psychiatric: Denied psychiatric symptoms, anxiety and depression.  Endocrine: Denied endocrine symptoms including hot flashes and night sweats.   Meds:   Current Outpatient Medications on File Prior to Visit  Medication Sig Dispense Refill   aspirin EC 81 MG tablet Take 1 tablet (81 mg total) by mouth daily. Swallow whole. 90 tablet 3   Calcium Carbonate-Vit D-Min (CALCIUM 1200 PO) Take by mouth.     Cholecalciferol (VITAMIN D3) 20 MCG (800 UNIT) TABS Take by mouth.     cyanocobalamin (VITAMIN B12) 1000 MCG tablet Take 1,000 mcg by mouth daily.     fenofibrate 54 MG tablet Take 1 tablet (54 mg total) by mouth daily. 90 tablet 3   losartan (COZAAR) 50 MG tablet Take 1.5 tablets (75 mg total) by mouth daily. 90 tablet 3   Magnesium 400 MG TABS Take 420 mg by mouth.     metoprolol tartrate (LOPRESSOR) 25  MG tablet Take 25 mg by  mouth 2 (two) times daily. Pt takes one 25 mg tablet daily     zolpidem (AMBIEN) 10 MG tablet Take 1 tablet (10 mg total) by mouth at bedtime as needed for sleep. 30 tablet 2   atorvastatin (LIPITOR) 80 MG tablet Take 1 tablet (80 mg total) by mouth daily. 90 tablet 3   hydrALAZINE (APRESOLINE) 50 MG tablet Take 1 tablet (50 mg total) by mouth 3 (three) times daily as needed (If systolic BP greater than 150 mmHg.). If systolic BP greater than 150 mmHg. 90 tablet 0   No current facility-administered medications on file prior to visit.     Objective:     Vitals:   12/06/23 0931 12/06/23 0954  BP: (!) 160/94 (!) 175/92  Pulse: (!) 2     Filed Weights   12/06/23 0931  Weight: 134 lb 4.8 oz (60.9 kg)              She has deferred examination today because she has no pelvic symptoms and has recently completed mammography.  Assessment:    [id number] Patient Active Problem List   Diagnosis Date Noted   Weight gain 03/22/2023   B12 deficiency 12/20/2022   Altered mental status 12/14/2022   TIA (transient ischemic attack) 12/12/2022   Depression with anxiety 12/12/2022   Incomplete tear of rotator cuff 09/11/2019   Shoulder pain 09/11/2019   Stiffness of shoulder joint 09/11/2019   Strain of rotator cuff capsule 09/11/2019   Disorder of bursae of shoulder region 09/11/2019   Status post lumbar discectomy 05/07/2019   Hand weakness 06/26/2018   Numbness and tingling 05/30/2018   Bilateral hand pain 04/03/2018   Diplopia 04/03/2018   Numbness and tingling in both hands 04/03/2018   Encounter to establish care    Perimenopausal vasomotor symptoms 06/10/2015   Status post hysterectomy 06/10/2015   History of cervical dysplasia 06/10/2015   History of oophorectomy, unilateral 06/10/2015   Cholelithiasis 03/31/2015   Melanoma (HCC) 03/31/2015   Hx of abnormal cervical Pap smear 03/31/2015   Tinea versicolor 03/31/2015   Vitamin D deficiency 03/31/2015   Hyperlipemia 03/31/2015    Depression 03/31/2015   Acute anxiety 03/31/2015   Insomnia 03/31/2015   Hypertension 03/31/2015   Plantar fasciitis 03/31/2015   Frequent UTI 03/31/2015   Discoid eczema 03/31/2015     1. Well woman exam with routine gynecological exam   2. Hormone replacement therapy (HRT)   3. Symptomatic menopausal or female climacteric states     Doing well on ERT.  No issues with urine loss status post TOT.  HTN   Plan:            1.  Basic Screening Recommendations The basic screening recommendations for asymptomatic women were discussed with the patient during her visit.  The age-appropriate recommendations were discussed with her and the rational for the tests reviewed.  When I am informed by the patient that another primary care physician has previously obtained the age-appropriate tests and they are up-to-date, only outstanding tests are ordered and referrals given as necessary.  Abnormal results of tests will be discussed with her when all of her results are completed.  Routine preventative health maintenance measures emphasized: Exercise/Diet/Weight control, Tobacco Warnings, Alcohol/Substance use risks and Stress Management Continue ERT 2.  Advised follow-up for hypertension.  Orders No orders of the defined types were placed in this encounter.    Meds ordered this encounter  Medications   estradiol (ESTRACE)  1 MG tablet    Sig: Take 1 tablet (1 mg total) by mouth daily.    Dispense:  90 tablet    Refill:  3          F/U  Return in about 1 year (around 12/05/2024) for Annual Physical.  Elonda Husky, M.D. 12/06/2023 9:56 AM

## 2024-01-08 ENCOUNTER — Ambulatory Visit (INDEPENDENT_AMBULATORY_CARE_PROVIDER_SITE_OTHER): Admitting: Family

## 2024-01-08 VITALS — BP 138/82 | HR 92 | Temp 97.6°F | Ht 64.0 in | Wt 136.4 lb

## 2024-01-08 DIAGNOSIS — G4700 Insomnia, unspecified: Secondary | ICD-10-CM | POA: Diagnosis not present

## 2024-01-08 DIAGNOSIS — E785 Hyperlipidemia, unspecified: Secondary | ICD-10-CM

## 2024-01-08 DIAGNOSIS — I10 Essential (primary) hypertension: Secondary | ICD-10-CM | POA: Diagnosis not present

## 2024-01-08 MED ORDER — ZOLPIDEM TARTRATE 10 MG PO TABS: 10.0000 mg | ORAL_TABLET | Freq: Every evening | ORAL | 2 refills | Status: DC | PRN

## 2024-01-08 MED ORDER — HYDRALAZINE HCL 50 MG PO TABS: 50.0000 mg | ORAL_TABLET | Freq: Three times a day (TID) | ORAL | 0 refills | Status: AC | PRN

## 2024-01-08 MED ORDER — LOSARTAN POTASSIUM 100 MG PO TABS: 100.0000 mg | ORAL_TABLET | Freq: Every day | ORAL | 3 refills | Status: AC

## 2024-01-08 NOTE — Assessment & Plan Note (Addendum)
 Well-controlled.  Patient would like to trial stop fenofibrate.  Will monitor triglycerides closely.  Pending lipid panel in 3 months time.  Continue Lipitor 80 mg daily.  Discussed importance of cholesterol control particularly with her history of TIA.

## 2024-01-08 NOTE — Assessment & Plan Note (Signed)
 Chronic, suboptimal control.  Increase losartan from 75 mg to 100 mg.  Continue metoprolol tartrate 25 mg twice daily.  Discussed goal less than 130/80. BMP in one week.

## 2024-01-08 NOTE — Progress Notes (Signed)
 Assessment & Plan:  Primary hypertension Assessment & Plan: Chronic, suboptimal control.  Increase losartan from 75 mg to 100 mg.  Continue metoprolol tartrate 25 mg twice daily.  Discussed goal less than 130/80. BMP in one week.   Orders: -     hydrALAZINE HCl; Take 1 tablet (50 mg total) by mouth 3 (three) times daily as needed (If systolic BP greater than 150 mmHg.). If systolic BP greater than 150 mmHg.  Dispense: 30 tablet; Refill: 0 -     Losartan Potassium; Take 1 tablet (100 mg total) by mouth daily.  Dispense: 90 tablet; Refill: 3 -     Basic metabolic panel with GFR; Future  Insomnia, unspecified type -     Zolpidem Tartrate; Take 1 tablet (10 mg total) by mouth at bedtime as needed for sleep.  Dispense: 30 tablet; Refill: 2  Hyperlipidemia, unspecified hyperlipidemia type Assessment & Plan: Well-controlled.  Patient would like to trial stop fenofibrate.  Will monitor triglycerides closely.  Pending lipid panel in 3 months time.  Continue Lipitor 80 mg daily.  Discussed importance of cholesterol control particularly with her history of TIA.      Return precautions given.   Risks, benefits, and alternatives of the medications and treatment plan prescribed today were discussed, and patient expressed understanding.   Education regarding symptom management and diagnosis given to patient on AVS either electronically or printed.  No follow-ups on file.  Rennie Plowman, FNP  Subjective:    Patient ID: Jill Porter, female    DOB: 11/14/1965, 58 y.o.   MRN: [id number]  CC: Jill Porter is a 58 y.o. female who presents today for follow up.   HPI:  Feels well today.  No new complaints  Compliant with losartan to 75 mg daily,metoprolol tartrate 25 mg twice daily .  Denies chest pain, shortness of breath   She would like to trial stop fenofibrate. She is compliant with lipitor 80mg    Allergies: Sulfa antibiotics, Penicillins, Silicone, and Tape Current  Outpatient Medications on File Prior to Visit  Medication Sig Dispense Refill   Calcium Carbonate-Vit D-Min (CALCIUM 1200 PO) Take by mouth.     Cholecalciferol (VITAMIN D3) 20 MCG (800 UNIT) TABS Take by mouth.     cyanocobalamin (VITAMIN B12) 1000 MCG tablet Take 1,000 mcg by mouth daily.     estradiol (ESTRACE) 1 MG tablet Take 1 tablet (1 mg total) by mouth daily. 90 tablet 3   Magnesium 400 MG TABS Take 420 mg by mouth.     metoprolol tartrate (LOPRESSOR) 25 MG tablet Take 25 mg by mouth 2 (two) times daily. Pt takes one 25 mg tablet daily     atorvastatin (LIPITOR) 80 MG tablet Take 1 tablet (80 mg total) by mouth daily. 90 tablet 3   No current facility-administered medications on file prior to visit.    Review of Systems  Constitutional:  Negative for chills and fever.  Respiratory:  Negative for cough.   Cardiovascular:  Negative for chest pain and palpitations.  Gastrointestinal:  Negative for nausea and vomiting.      Objective:    BP 138/82   Pulse 92   Temp 97.6 F (36.4 C) (Oral)   Ht 5\' 4"  (1.626 m)   Wt 136 lb 6.4 oz (61.9 kg)   SpO2 96%   BMI 23.41 kg/m  BP Readings from Last 3 Encounters:  01/08/24 138/82  12/06/23 (!) 175/92  09/22/23 124/82   Wt Readings from Last 3  Encounters:  01/08/24 136 lb 6.4 oz (61.9 kg)  12/06/23 134 lb 4.8 oz (60.9 kg)  09/22/23 132 lb 12.8 oz (60.2 kg)    Physical Exam Vitals reviewed.  Constitutional:      Appearance: She is well-developed.  Eyes:     Conjunctiva/sclera: Conjunctivae normal.  Cardiovascular:     Rate and Rhythm: Normal rate and regular rhythm.     Pulses: Normal pulses.     Heart sounds: Normal heart sounds.  Pulmonary:     Effort: Pulmonary effort is normal.     Breath sounds: Normal breath sounds. No wheezing, rhonchi or rales.  Skin:    General: Skin is warm and dry.  Neurological:     Mental Status: She is alert.  Psychiatric:        Speech: Speech normal.        Behavior: Behavior  normal.        Thought Content: Thought content normal.

## 2024-01-16 ENCOUNTER — Other Ambulatory Visit

## 2024-04-09 ENCOUNTER — Other Ambulatory Visit: Payer: Self-pay | Admitting: Family

## 2024-04-09 DIAGNOSIS — G4700 Insomnia, unspecified: Secondary | ICD-10-CM

## 2024-04-09 DIAGNOSIS — G459 Transient cerebral ischemic attack, unspecified: Secondary | ICD-10-CM

## 2024-04-26 ENCOUNTER — Telehealth: Payer: Self-pay | Admitting: Family

## 2024-04-26 NOTE — Telephone Encounter (Signed)
 Lab order needed

## 2024-04-28 ENCOUNTER — Other Ambulatory Visit: Payer: Self-pay | Admitting: Family

## 2024-04-28 DIAGNOSIS — I10 Essential (primary) hypertension: Secondary | ICD-10-CM

## 2024-04-28 DIAGNOSIS — Z13220 Encounter for screening for lipoid disorders: Secondary | ICD-10-CM

## 2024-05-06 ENCOUNTER — Other Ambulatory Visit (INDEPENDENT_AMBULATORY_CARE_PROVIDER_SITE_OTHER)

## 2024-05-06 ENCOUNTER — Ambulatory Visit: Payer: Self-pay | Admitting: Family

## 2024-05-06 DIAGNOSIS — I10 Essential (primary) hypertension: Secondary | ICD-10-CM | POA: Diagnosis not present

## 2024-05-06 DIAGNOSIS — Z136 Encounter for screening for cardiovascular disorders: Secondary | ICD-10-CM

## 2024-05-06 DIAGNOSIS — Z13220 Encounter for screening for lipoid disorders: Secondary | ICD-10-CM

## 2024-05-06 LAB — CBC WITH DIFFERENTIAL/PLATELET
Basophils Absolute: 0 K/uL (ref 0.0–0.1)
Basophils Relative: 1 % (ref 0.0–3.0)
Eosinophils Absolute: 0.1 K/uL (ref 0.0–0.7)
Eosinophils Relative: 2.6 % (ref 0.0–5.0)
HCT: 40 % (ref 36.0–46.0)
Hemoglobin: 13.3 g/dL (ref 12.0–15.0)
Lymphocytes Relative: 29.2 % (ref 12.0–46.0)
Lymphs Abs: 1.5 K/uL (ref 0.7–4.0)
MCHC: 33.3 g/dL (ref 30.0–36.0)
MCV: 85.5 fl (ref 78.0–100.0)
Monocytes Absolute: 0.4 K/uL (ref 0.1–1.0)
Monocytes Relative: 7.3 % (ref 3.0–12.0)
Neutro Abs: 3 K/uL (ref 1.4–7.7)
Neutrophils Relative %: 59.9 % (ref 43.0–77.0)
Platelets: 272 K/uL (ref 150.0–400.0)
RBC: 4.67 Mil/uL (ref 3.87–5.11)
RDW: 13.6 % (ref 11.5–15.5)
WBC: 5 K/uL (ref 4.0–10.5)

## 2024-05-06 LAB — COMPREHENSIVE METABOLIC PANEL WITH GFR
ALT: 16 U/L (ref 0–35)
AST: 17 U/L (ref 0–37)
Albumin: 3.9 g/dL (ref 3.5–5.2)
Alkaline Phosphatase: 63 U/L (ref 39–117)
BUN: 8 mg/dL (ref 6–23)
CO2: 29 meq/L (ref 19–32)
Calcium: 9 mg/dL (ref 8.4–10.5)
Chloride: 100 meq/L (ref 96–112)
Creatinine, Ser: 0.89 mg/dL (ref 0.40–1.20)
GFR: 71.24 mL/min (ref >60.00)
Glucose, Bld: 154 mg/dL — ABNORMAL HIGH (ref 70–99)
Potassium: 3.9 meq/L (ref 3.5–5.1)
Sodium: 139 meq/L (ref 135–145)
Total Bilirubin: 0.3 mg/dL (ref 0.2–1.2)
Total Protein: 6.9 g/dL (ref 6.0–8.3)

## 2024-05-06 LAB — TSH: TSH: 1.12 u[IU]/mL (ref 0.35–5.50)

## 2024-05-06 LAB — LIPID PANEL
Cholesterol: 236 mg/dL — ABNORMAL HIGH (ref 0–200)
HDL: 59.2 mg/dL (ref >39.00)
LDL Cholesterol: 135 mg/dL — ABNORMAL HIGH (ref 0–99)
NonHDL: 176.48
Total CHOL/HDL Ratio: 4
Triglycerides: 207 mg/dL — ABNORMAL HIGH (ref 0.0–149.0)
VLDL: 41.4 mg/dL — ABNORMAL HIGH (ref 0.0–40.0)

## 2024-05-09 ENCOUNTER — Encounter: Payer: Self-pay | Admitting: Family

## 2024-05-09 ENCOUNTER — Ambulatory Visit (INDEPENDENT_AMBULATORY_CARE_PROVIDER_SITE_OTHER): Admitting: Family

## 2024-05-09 VITALS — BP 142/86 | HR 96 | Temp 98.0°F | Ht 64.0 in | Wt 133.2 lb

## 2024-05-09 DIAGNOSIS — E785 Hyperlipidemia, unspecified: Secondary | ICD-10-CM | POA: Diagnosis not present

## 2024-05-09 DIAGNOSIS — M2550 Pain in unspecified joint: Secondary | ICD-10-CM | POA: Diagnosis not present

## 2024-05-09 DIAGNOSIS — I10 Essential (primary) hypertension: Secondary | ICD-10-CM | POA: Diagnosis not present

## 2024-05-09 MED ORDER — HYDROCHLOROTHIAZIDE 12.5 MG PO CAPS
12.5000 mg | ORAL_CAPSULE | Freq: Every day | ORAL | 3 refills | Status: AC
Start: 2024-05-09 — End: ?

## 2024-05-09 NOTE — Assessment & Plan Note (Signed)
 Hyperlipidemia with history of transient ischemic attack (TIA)   Managing hyperlipidemia is critical due to her TIA history. She is on Lipitor  80 mg. LDL goal < 70. Repeat cholesterol labs in three months. Consider starting Zetia if LDL goals are unmet. Discontinue fenofibrate  if triglycerides remain controlled.

## 2024-05-09 NOTE — Assessment & Plan Note (Addendum)
 Chronic, uncontrolled. Blood pressure management continues with losartan  and metoprolol .  Start hydrochlorothiazide  12.5 mg daily. Order BMP in one week to check electrolytes and potassium. Advise a low sodium diet. She will confirm metoprolol  succinate 50 mg is taken once daily as managed by Dr Florencio.

## 2024-05-09 NOTE — Progress Notes (Signed)
 Assessment & Plan:  Primary hypertension Assessment & Plan: Chronic, uncontrolled. Blood pressure management continues with losartan  and metoprolol .  Start hydrochlorothiazide  12.5 mg daily. Order BMP in one week to check electrolytes and potassium. Advise a low sodium diet. She will confirm metoprolol  succinate 50 mg is taken once daily as managed by Dr Florencio.  Orders: -     Basic metabolic panel with GFR; Future -     hydroCHLOROthiazide ; Take 1 capsule (12.5 mg total) by mouth daily.  Dispense: 90 capsule; Refill: 3  Arthralgia, unspecified joint Assessment & Plan: Presentation c/w plantar fasciitis. Arthralgia in her hands c/w osteoarthritis. Conservative treatment is recommended. Recommend supportive shoes with arch support and advise icing feet with a frozen water  bottle for her feet.Start OTC topical NSAID like Voltaren gel for joint pain. Consider podiatry referral if no improvement.       Hyperlipidemia, unspecified hyperlipidemia type Assessment & Plan: Hyperlipidemia with history of transient ischemic attack (TIA)   Managing hyperlipidemia is critical due to her TIA history. She is on Lipitor  80 mg. LDL goal < 70. Repeat cholesterol labs in three months. Consider starting Zetia if LDL goals are unmet. Discontinue fenofibrate  if triglycerides remain controlled.      Return precautions given.   Risks, benefits, and alternatives of the medications and treatment plan prescribed today were discussed, and patient expressed understanding.   Education regarding symptom management and diagnosis given to patient on AVS either electronically or printed.  Return in about 3 months (around 08/09/2024).  Rollene Northern, FNP  Subjective:    Patient ID: Jill Porter, female    DOB: [birth year], 58 y.o.   MRN: [id number]  CC: Jill Porter is a 58 y.o. female who presents today for follow up.   HPI: HPI Discussed the use of AI scribe software for clinical note  transcription with the patient, who gave verbal consent to proceed.  History of Present Illness   Jill Porter is a 58 year old female with hypertension and hyperlipidemia who presents for a follow-up visit to discuss her blood pressure and cholesterol levels.  She has been taking Lipitor  80 mg and fenofibrate . Her weight has decreased from 136 lbs in April to 133 lbs currently. She has experienced increased stress over the past two weeks, which she suspects may be affecting her cholesterol levels.  She experiences significant pain in her BL toes particularly in the mornings when she first stands up. The pain is described as a 'bone hurt' and improves as the day progresses. She has been using slip-on Skechers to help with the pain. There is no numbness or tingling associated with the pain. Denies injury.   She mentions tingling in her calves when lying down at night, but no pain. She has a history of back pain but denies numbness coming from her low back. She has noticed some fluid retention in her hands , describing as puffy. She has been on hydrochlorothiazide  25mg  in the past without any issues.  Denies intermittent claudication.  Her current medications include losartan  100 mg, metoprolol  succinate 50 mg once daily, and she has not needed to use hydralazine  50 mg three times a day as needed. No chest pain or shortness of breath. She does not consume coffee or any stimulants.             Compliant with Increased losartan  from 75 mg to 100 mg. Compliant with metoprolol  tartrate 25 mg twice daily.  Allergies: Sulfa antibiotics, Penicillins, Silicone,  and Tape Current Outpatient Medications on File Prior to Visit  Medication Sig Dispense Refill   atorvastatin  (LIPITOR ) 80 MG tablet TAKE 1 TABLET BY MOUTH EVERY DAY 90 tablet 0   Calcium  Carbonate-Vit D-Min (CALCIUM  1200 PO) Take by mouth.     Cholecalciferol (VITAMIN D3) 20 MCG (800 UNIT) TABS Take by mouth.      cyanocobalamin  (VITAMIN B12) 1000 MCG tablet Take 1,000 mcg by mouth daily.     estradiol  (ESTRACE ) 1 MG tablet Take 1 tablet (1 mg total) by mouth daily. 90 tablet 3   fenofibrate  54 MG tablet Take 54 mg by mouth daily.     losartan  (COZAAR ) 100 MG tablet Take 1 tablet (100 mg total) by mouth daily. 90 tablet 3   Magnesium 400 MG TABS Take 420 mg by mouth.     metoprolol  succinate (TOPROL -XL) 50 MG 24 hr tablet Take 50 mg by mouth daily.     metoprolol  tartrate (LOPRESSOR ) 25 MG tablet Take 25 mg by mouth 2 (two) times daily. Pt takes one 25 mg tablet daily     zolpidem  (AMBIEN ) 10 MG tablet TAKE 1 TABLET BY MOUTH AT BEDTIME AS NEEDED FOR SLEEP. 30 tablet 2   hydrALAZINE  (APRESOLINE ) 50 MG tablet Take 1 tablet (50 mg total) by mouth 3 (three) times daily as needed (If systolic BP greater than 150 mmHg.). If systolic BP greater than 150 mmHg. 30 tablet 0   No current facility-administered medications on file prior to visit.    Review of Systems  Constitutional:  Negative for chills, fever and unexpected weight change.  HENT:  Negative for congestion.   Respiratory:  Negative for cough.   Cardiovascular:  Negative for chest pain, palpitations and leg swelling.  Gastrointestinal:  Negative for nausea and vomiting.  Musculoskeletal:  Positive for arthralgias. Negative for myalgias.  Skin:  Negative for rash.  Neurological:  Positive for numbness. Negative for headaches.  Hematological:  Negative for adenopathy.  Psychiatric/Behavioral:  Negative for confusion.       Objective:    BP (!) 142/86   Pulse 96   Temp 98 F (36.7 C) (Oral)   Ht 5' 4 (1.626 m)   Wt 133 lb 3.2 oz (60.4 kg)   SpO2 97%   BMI 22.86 kg/m  BP Readings from Last 3 Encounters:  05/09/24 (!) 142/86  01/08/24 138/82  12/06/23 (!) 175/92   Wt Readings from Last 3 Encounters:  05/09/24 133 lb 3.2 oz (60.4 kg)  01/08/24 136 lb 6.4 oz (61.9 kg)  12/06/23 134 lb 4.8 oz (60.9 kg)    Physical Exam Vitals  reviewed.  Constitutional:      Appearance: She is well-developed.  Eyes:     Conjunctiva/sclera: Conjunctivae normal.  Cardiovascular:     Rate and Rhythm: Normal rate and regular rhythm.     Pulses: Normal pulses.     Heart sounds: Normal heart sounds.     Comments: No LE edema, palpable cords or masses. No erythema or increased warmth. No asymmetry in calf size when compared bilaterally LE hair growth symmetric and present. No discoloration or varicosities noted. LE warm and palpable pedal pulses.  Pulmonary:     Effort: Pulmonary effort is normal.     Breath sounds: Normal breath sounds. No wheezing, rhonchi or rales.  Musculoskeletal:     Right hand: No swelling, tenderness or bony tenderness. Normal range of motion. Normal pulse.     Left hand: No swelling, tenderness or bony tenderness. Normal  range of motion. Normal pulse.     Right lower leg: No edema.     Right foot: Normal range of motion. No swelling or bony tenderness. Normal pulse.     Left foot: Normal range of motion. No swelling or bony tenderness. Normal pulse.     Comments: Heberdens note present on DIP joint left hand.   No wrist pain,pain of MCP bilaterally.  No increased warmth, erythema biltaterally..   No pain with dorsiflexion of BL feet; no erythema, increased warmth bilateral feet.  Skin:    General: Skin is warm and dry.  Neurological:     Mental Status: She is alert.  Psychiatric:        Speech: Speech normal.        Behavior: Behavior normal.        Thought Content: Thought content normal.

## 2024-05-09 NOTE — Assessment & Plan Note (Addendum)
 Presentation c/w plantar fasciitis. Arthralgia in her hands c/w osteoarthritis. Conservative treatment is recommended. Recommend supportive shoes with arch support and advise icing feet with a frozen water  bottle for her feet.Start OTC topical NSAID like Voltaren gel for joint pain. Consider podiatry referral if no improvement.

## 2024-05-09 NOTE — Patient Instructions (Addendum)
 Double check you are on metoprolol  succinate ( Toprol  50mg  XL) ONCE DAILY:   metoprolol  succinate (TOPROL -XL) 50 MG XL tablet Take 1 tablet (50 mg total) by mouth once daily 90 tablet 3   Trial start hydrochlorothiazide  12.5mg  daily for blood pressure control; this is also diuretic. Goal of blood pressure < 130/80.  Send me mychart message when you want me to order your cholesterol labs.   Over the counter voltaren gel, icing  Plantar Fasciitis: What to Know  Your plantar fascia is a band of thick tissue on the bottom of your foot. It connects your heel bone to the base of your toes. If the fascia gets irritated, it can cause pain in your heel or foot. This is called plantar fasciitis. In some cases, plantar fasciitis can make it hard for you to walk or move. The pain is often worse in the morning after sleeping, or after sitting or lying down for a long time. Pain may also be worse after walking or standing for a long time. What are the causes? Plantar fasciitis may be caused by: Standing for a long time. Wearing shoes that don't have good arch support. Doing high-impact activities. These are things that put stress on your joints. They include: Ballet. Aerobic exercises. These are exercises that make your heart beat faster. Being overweight. Having a way of walking, or gait, that isn't normal. Tight muscles in your calf, which is in the back of your lower leg. High arches in your feet, or flat feet. Starting a new sport or activity. What are the signs or symptoms? The main symptom of plantar fasciitis is heel pain. Your pain may get worse after: You take your first steps after a time of rest. This includes in the morning after you wake up, or after you've been sitting or lying down for a while. Standing still for a long time. Pain may lessen after 30-45 minutes of activity, such as gentle walking. How is this diagnosed? Plantar fasciitis may be diagnosed based on your medical  history, your symptoms, and an exam. Your health care provider will check for: A tender spot on the bottom of your foot. A high arch in your foot, or flat feet. Pain when you move your foot. Trouble moving your foot. You may also have tests. These may include: X-rays. Ultrasound. MRI. How is this treated? Treatment depends on how bad your plantar fasciitis is. It may include: RICE therapy. This stands for rest, ice, pressure (compression), and raising (elevating) the foot. Exercises to stretch your calves and plantar fascia. A night splint. This holds your foot in a stretched, upward position while you sleep. Physical therapy. This can help with symptoms. It can also prevent problems in the future. Shots of a steroid medicine called cortisone. This can help with pain and irritation. Extracorporeal shock wave therapy. This uses electric shocks to stimulate your plantar fascia. If other treatments don't help, you may need to have surgery. Follow these instructions at home: Managing pain, stiffness, and swelling  Use ice, an ice pack, or a frozen bottle of water  as told. Place a towel between your skin and the ice. Roll the bottom of your foot over the ice or frozen bottle. Do this for 20 minutes, 2-3 times a day. If your skin turns red, take off the ice right away to prevent skin damage. The risk of damage is higher if you can't feel pain, heat, or cold. Wear shoes that have air-sole or gel-sole cushions. You  could also try soft shoe inserts made for plantar fasciitis. Activity Try not to do things that cause pain. Ask what things are safe for you to do. Exercise as told. Try activities that are low impact. This means that they're easier on your joints. They include: Swimming. Water  aerobics. Biking. General instructions Take your medicines only as told. Wear a night splint as told. Loosen the splint if your toes tingle, are numb, or turn cold and blue. Stay at a healthy weight.  Work with your provider to lose weight as needed. Contact a health care provider if: Your symptoms don't go away with treatment. You have pain that gets worse. Your pain makes it hard to move or do everyday things. This information is not intended to replace advice given to you by your health care provider. Make sure you discuss any questions you have with your health care provider. Document Revised: 02/20/2023 Document Reviewed: 02/20/2023 Elsevier Patient Education  2024 [employer].

## 2024-05-16 ENCOUNTER — Other Ambulatory Visit (INDEPENDENT_AMBULATORY_CARE_PROVIDER_SITE_OTHER)

## 2024-05-16 DIAGNOSIS — I10 Essential (primary) hypertension: Secondary | ICD-10-CM

## 2024-05-16 LAB — BASIC METABOLIC PANEL WITH GFR
BUN: 14 mg/dL (ref 6–23)
CO2: 33 meq/L — ABNORMAL HIGH (ref 19–32)
Calcium: 9.5 mg/dL (ref 8.4–10.5)
Chloride: 96 meq/L (ref 96–112)
Creatinine, Ser: 0.93 mg/dL (ref 0.40–1.20)
GFR: 67.56 mL/min (ref >60.00)
Glucose, Bld: 105 mg/dL — ABNORMAL HIGH (ref 70–99)
Potassium: 4.4 meq/L (ref 3.5–5.1)
Sodium: 136 meq/L (ref 135–145)

## 2024-05-17 ENCOUNTER — Ambulatory Visit: Payer: Self-pay | Admitting: Family

## 2024-05-22 ENCOUNTER — Other Ambulatory Visit: Payer: Self-pay | Admitting: Family

## 2024-05-22 DIAGNOSIS — G459 Transient cerebral ischemic attack, unspecified: Secondary | ICD-10-CM

## 2024-05-22 DIAGNOSIS — I10 Essential (primary) hypertension: Secondary | ICD-10-CM

## 2024-07-10 ENCOUNTER — Other Ambulatory Visit: Payer: Self-pay | Admitting: Family

## 2024-07-10 DIAGNOSIS — G4700 Insomnia, unspecified: Secondary | ICD-10-CM

## 2024-07-29 ENCOUNTER — Other Ambulatory Visit: Payer: Self-pay | Admitting: Medical Genetics

## 2024-07-29 DIAGNOSIS — Z006 Encounter for examination for normal comparison and control in clinical research program: Secondary | ICD-10-CM

## 2024-08-12 ENCOUNTER — Ambulatory Visit (INDEPENDENT_AMBULATORY_CARE_PROVIDER_SITE_OTHER): Admitting: Family

## 2024-08-12 VITALS — BP 118/70 | HR 93 | Temp 97.8°F | Ht 64.0 in | Wt 123.8 lb

## 2024-08-12 DIAGNOSIS — I10 Essential (primary) hypertension: Secondary | ICD-10-CM

## 2024-08-12 DIAGNOSIS — G459 Transient cerebral ischemic attack, unspecified: Secondary | ICD-10-CM

## 2024-08-12 DIAGNOSIS — Z136 Encounter for screening for cardiovascular disorders: Secondary | ICD-10-CM

## 2024-08-12 DIAGNOSIS — Z13220 Encounter for screening for lipoid disorders: Secondary | ICD-10-CM

## 2024-08-12 DIAGNOSIS — G4700 Insomnia, unspecified: Secondary | ICD-10-CM

## 2024-08-12 MED ORDER — ZOLPIDEM TARTRATE 10 MG PO TABS: ORAL_TABLET | ORAL | 2 refills | Status: AC

## 2024-08-12 NOTE — Progress Notes (Signed)
 Assessment & Plan:  Primary hypertension Assessment & Plan: Chronic, improved.  Continue hydrochlorothiazide  12.5 mg daily, losartan  100 mg daily, metoprolol  succinate 50 mg as taken once daily as managed by Dr Florencio.  She takes hydralazine  50 mg 3 times daily as needed  Orders: -     Basic metabolic panel with GFR; Future  Encounter for lipid screening for cardiovascular disease  Insomnia, unspecified type -     Zolpidem  Tartrate; TAKE 1 TABLET BY MOUTH EVERY DAY AT BEDTIME AS NEEDED FOR SLEEP  Dispense: 30 tablet; Refill: 2  TIA (transient ischemic attack) Assessment & Plan: Chronic, symptomatically stable.  Previous LDL greater than 100.  Reordered lipid panel.  Continue Lipitor  80 mg, fenofibrate  54 mg for now.   Orders: -     Lipid panel; Future     Return precautions given.   Risks, benefits, and alternatives of the medications and treatment plan prescribed today were discussed, and patient expressed understanding.   Education regarding symptom management and diagnosis given to patient on AVS either electronically or printed.  Return in about 3 months (around 11/12/2024) for Fasting labs in 2-3 weeks.  Jill Northern, FNP  Subjective:    Patient ID: Jill Porter, female    DOB: [birth year], 58 y.o.   MRN: [id number]  CC: Jill Porter is a 58 y.o. female who presents today for follow up.   HPI: Feels well today.  No new complaints.  She is compliant with Lipitor  80 mg daily, fenofibrate  54 mg daily.  Blood pressure improved on hydrochlorothiazide .  Denies dizziness, chest pain, shortness of breath. She is sleeping well on Ambien  10 mg nightly    Allergies: Sulfa antibiotics, Penicillins, Silicone, and Tape Current Outpatient Medications on File Prior to Visit  Medication Sig Dispense Refill   atorvastatin  (LIPITOR ) 80 MG tablet TAKE 1 TABLET BY MOUTH EVERY DAY 90 tablet 0   Calcium  Carbonate-Vit D-Min (CALCIUM  1200 PO) Take by mouth.      Cholecalciferol (VITAMIN D3) 20 MCG (800 UNIT) TABS Take by mouth.     cyanocobalamin  (VITAMIN B12) 1000 MCG tablet Take 1,000 mcg by mouth daily.     estradiol  (ESTRACE ) 1 MG tablet Take 1 tablet (1 mg total) by mouth daily. 90 tablet 3   fenofibrate  54 MG tablet TAKE 1 TABLET BY MOUTH DAILY 90 tablet 3   hydrALAZINE  (APRESOLINE ) 50 MG tablet Take 1 tablet (50 mg total) by mouth 3 (three) times daily as needed (If systolic BP greater than 150 mmHg.). If systolic BP greater than 150 mmHg. 30 tablet 0   hydrochlorothiazide  (MICROZIDE ) 12.5 MG capsule Take 1 capsule (12.5 mg total) by mouth daily. 90 capsule 3   losartan  (COZAAR ) 100 MG tablet Take 1 tablet (100 mg total) by mouth daily. 90 tablet 3   Magnesium 400 MG TABS Take 420 mg by mouth.     metoprolol  succinate (TOPROL -XL) 50 MG 24 hr tablet Take 50 mg by mouth daily.     No current facility-administered medications on file prior to visit.    Review of Systems  Constitutional:  Negative for chills and fever.  Respiratory:  Negative for cough.   Cardiovascular:  Negative for chest pain and palpitations.  Gastrointestinal:  Negative for nausea and vomiting.      Objective:    BP 118/70   Pulse 93   Temp 97.8 F (36.6 C)   Ht 5' 4 (1.626 m)   Wt 123 lb 12.8 oz (56.2 kg)  SpO2 98%   BMI 21.25 kg/m  BP Readings from Last 3 Encounters:  08/12/24 118/70  05/09/24 (!) 142/86  01/08/24 138/82   Wt Readings from Last 3 Encounters:  08/12/24 123 lb 12.8 oz (56.2 kg)  05/09/24 133 lb 3.2 oz (60.4 kg)  01/08/24 136 lb 6.4 oz (61.9 kg)    Physical Exam Vitals reviewed.  Constitutional:      Appearance: She is well-developed.  Eyes:     Conjunctiva/sclera: Conjunctivae normal.  Cardiovascular:     Rate and Rhythm: Normal rate and regular rhythm.     Pulses: Normal pulses.     Heart sounds: Normal heart sounds.  Pulmonary:     Effort: Pulmonary effort is normal.     Breath sounds: Normal breath sounds. No wheezing,  rhonchi or rales.  Skin:    General: Skin is warm and dry.  Neurological:     Mental Status: She is alert.  Psychiatric:        Speech: Speech normal.        Behavior: Behavior normal.        Thought Content: Thought content normal.

## 2024-08-12 NOTE — Patient Instructions (Signed)
Please review medication list.

## 2024-08-12 NOTE — Assessment & Plan Note (Signed)
 Chronic, symptomatically stable.  Previous LDL greater than 100.  Reordered lipid panel.  Continue Lipitor  80 mg, fenofibrate  54 mg for now.

## 2024-08-12 NOTE — Assessment & Plan Note (Signed)
 Chronic, improved.  Continue hydrochlorothiazide  12.5 mg daily, losartan  100 mg daily, metoprolol  succinate 50 mg as taken once daily as managed by Dr Florencio.  She takes hydralazine  50 mg 3 times daily as needed

## 2024-08-21 ENCOUNTER — Ambulatory Visit
Admission: RE | Admit: 2024-08-21 | Discharge: 2024-08-21 | Disposition: A | Discharge status: Home / Self Care | Attending: Emergency Medicine | Admitting: Emergency Medicine

## 2024-08-21 VITALS — BP 139/82 | HR 101 | Temp 98.6°F | Resp 18

## 2024-08-21 DIAGNOSIS — J0100 Acute maxillary sinusitis, unspecified: Secondary | ICD-10-CM

## 2024-08-21 MED ORDER — CEFDINIR 300 MG PO CAPS: 300.0000 mg | ORAL_CAPSULE | Freq: Two times a day (BID) | ORAL | 0 refills | Status: AC

## 2024-08-21 NOTE — ED Provider Notes (Signed)
 [name]    CSN: [id number] Arrival date & time: 08/21/24  1300      History   Chief Complaint Chief Complaint  Patient presents with   Cough   Fever    HPI Jill Porter is a 58 y.o. female.  Patient presents with 9-day history of congestion, sneezing, runny nose, postnasal drip, sinus pressure, cough.  Her voice is hoarse.  She reports intermittent fever and has been taking Tylenol .  No chest pain or shortness of breath.  No vomiting or diarrhea.  The history is provided by the patient and medical records.    Past Medical History:  Diagnosis Date   cervical dysplasia    Climacteric    GERD (gastroesophageal reflux disease)    occ   Hypertension    Melanoma (HCC)    Motion sickness    anytime moving   Pelvic adhesions    Recurrent cystitis     Patient Active Problem List   Diagnosis Date Noted   Weight gain 03/22/2023   B12 deficiency 12/20/2022   Altered mental status 12/14/2022   TIA (transient ischemic attack) 12/12/2022   Depression with anxiety 12/12/2022   Incomplete tear of rotator cuff 09/11/2019   Shoulder pain 09/11/2019   Stiffness of shoulder joint 09/11/2019   Strain of rotator cuff capsule 09/11/2019   Disorder of bursae of shoulder region 09/11/2019   Status post lumbar discectomy 05/07/2019   Hand weakness 06/26/2018   Numbness and tingling 05/30/2018   Bilateral hand pain 04/03/2018   Diplopia 04/03/2018   Numbness and tingling in both hands 04/03/2018   Encounter to establish care    Perimenopausal vasomotor symptoms 06/10/2015   Status post hysterectomy 06/10/2015   History of cervical dysplasia 06/10/2015   History of oophorectomy, unilateral 06/10/2015   Cholelithiasis 03/31/2015   Melanoma (HCC) 03/31/2015   Hx of abnormal cervical Pap smear 03/31/2015   Tinea versicolor 03/31/2015   Vitamin D  deficiency 03/31/2015   HLD (hyperlipidemia) 03/31/2015   Depression 03/31/2015   Acute anxiety 03/31/2015   Insomnia  03/31/2015   Hypertension 03/31/2015   Arthralgia 03/31/2015   Frequent UTI 03/31/2015   Discoid eczema 03/31/2015    Past Surgical History:  Procedure Laterality Date   ABDOMINAL HYSTERECTOMY  2000   due to pre cancerous cells   BASAL CELL CARCINOMA EXCISION  2013   removed from forehead and Lt [place]   BREAST CYST ASPIRATION  01/2010   CHOLECYSTECTOMY  06/19/2012   COLONOSCOPY WITH PROPOFOL  N/A 07/06/2015   Procedure: COLONOSCOPY WITH PROPOFOL ;  Surgeon: Rogelia Copping, MD;  Location: [facility];  Service: Endoscopy;  Laterality: N/A;   CYSTOCELE REPAIR N/A 10/17/2022   Procedure: ANTERIOR REPAIR;  Surgeon: Janit Alm Agent, MD;  Location: [facility];  Service: Gynecology;  Laterality: N/A;   EXPLORATORY LAPAROTOMY     lysis of adhesions   LIPOSUCTION     MELANOMA EXCISION  06/2012   Removed from Rt shoulder   OVARY SURGERY  1997   hx of removal of ovary and adhesions   PARATHYROIDECTOMY  04/14/2017   Dr Luke HOUSTON . One parathyroid removed.   PUBOVAGINAL SLING N/A 10/17/2022   Procedure: PUBO-VAGINAL SLING TOT;  Surgeon: Janit Alm Agent, MD;  Location: [facility];  Service: Gynecology;  Laterality: N/A;   ROTATOR CUFF REPAIR Left     OB History     Gravida  4   Para  4   Term  4   Preterm  AB      Living  4      SAB      IAB      Ectopic      Multiple      Live Births  4            Home Medications    Prior to Admission medications   Medication Sig Start Date End Date Taking? Authorizing Provider  cefdinir (OMNICEF) 300 MG capsule Take 1 capsule (300 mg total) by mouth 2 (two) times daily for 10 days. 08/21/24 08/31/24 Yes Corlis Burnard DEL, NP  atorvastatin  (LIPITOR ) 80 MG tablet TAKE 1 TABLET BY MOUTH EVERY DAY 04/09/24   Dineen Rollene MATSU, FNP  Calcium  Carbonate-Vit D-Min (CALCIUM  1200 PO) Take by mouth.    [provider]  Cholecalciferol (VITAMIN D3) 20 MCG (800 UNIT) TABS Take by mouth.    [provider]   cyanocobalamin  (VITAMIN B12) 1000 MCG tablet Take 1,000 mcg by mouth daily.    [provider]  estradiol  (ESTRACE ) 1 MG tablet Take 1 tablet (1 mg total) by mouth daily. 12/06/23 12/05/24  Janit Alm Agent, MD  fenofibrate  54 MG tablet TAKE 1 TABLET BY MOUTH DAILY 05/22/24   Webb, Padonda B, FNP  hydrALAZINE  (APRESOLINE ) 50 MG tablet Take 1 tablet (50 mg total) by mouth 3 (three) times daily as needed (If systolic BP greater than 150 mmHg.). If systolic BP greater than 150 mmHg. 01/08/24 08/12/24  Dineen Rollene MATSU, FNP  hydrochlorothiazide  (MICROZIDE ) 12.5 MG capsule Take 1 capsule (12.5 mg total) by mouth daily. 05/09/24   Dineen Rollene MATSU, FNP  losartan  (COZAAR ) 100 MG tablet Take 1 tablet (100 mg total) by mouth daily. 01/08/24   Dineen Rollene MATSU, FNP  Magnesium 400 MG TABS Take 420 mg by mouth.    [provider]  metoprolol  succinate (TOPROL -XL) 50 MG 24 hr tablet Take 50 mg by mouth daily.    [provider]  zolpidem  (AMBIEN ) 10 MG tablet TAKE 1 TABLET BY MOUTH EVERY DAY AT BEDTIME AS NEEDED FOR SLEEP 08/12/24   Dineen Rollene MATSU, FNP    Family History Family History  Problem Relation Age of Onset   Hypertension Mother    Lymphoma Mother    CVA Mother 83   Rheum arthritis Mother    Lupus Mother    Hyperlipidemia Mother    Hypertension Father    Heart attack Maternal Grandfather 63       MI   Diabetes Neg Hx    Heart disease Neg Hx    Kidney cancer Neg Hx    Bladder Cancer Neg Hx    Colon cancer Neg Hx    Breast cancer Neg Hx     Social History Social History   Tobacco Use   Smoking status: Never    Passive exposure: Never   Smokeless tobacco: Never  Vaping Use   Vaping status: Never Used  Substance Use Topics   Alcohol use: No    Alcohol/week: 0.0 standard drinks of alcohol   Drug use: No     Allergies   Sulfa antibiotics, Penicillins, Silicone, and Tape   Review of Systems Review of Systems  Constitutional:  Positive for fever.  Negative for chills.  HENT:  Positive for congestion, postnasal drip, rhinorrhea and voice change. Negative for ear pain and sore throat.   Respiratory:  Positive for cough. Negative for shortness of breath.   Cardiovascular:  Negative for chest pain and palpitations.  Physical Exam Triage Vital Signs ED Triage Vitals  Encounter Vitals Group     BP 08/21/24 1318 139/82     Girls Systolic BP Percentile --      Girls Diastolic BP Percentile --      Boys Systolic BP Percentile --      Boys Diastolic BP Percentile --      Pulse Rate 08/21/24 1318 (!) 101     Resp 08/21/24 1318 18     Temp 08/21/24 1318 98.6 F (37 C)     Temp src --      SpO2 08/21/24 1318 98 %     Weight --      Height --      Head Circumference --      Peak Flow --      Pain Score 08/21/24 1321 6     Pain Loc --      Pain Education --      Exclude from Growth Chart --    No data found.  Updated Vital Signs BP 139/82   Pulse (!) 101   Temp 98.6 F (37 C)   Resp 18   SpO2 98%   Visual Acuity Right Eye Distance:   Left Eye Distance:   Bilateral Distance:    Right Eye Near:   Left Eye Near:    Bilateral Near:     Physical Exam Constitutional:      General: She is not in acute distress. HENT:     Right Ear: Tympanic membrane normal.     Left Ear: Tympanic membrane normal.     Nose: Congestion and rhinorrhea present.     Mouth/Throat:     Mouth: Mucous membranes are moist.     Pharynx: Oropharynx is clear.  Cardiovascular:     Rate and Rhythm: Normal rate and regular rhythm.     Heart sounds: Normal heart sounds.  Pulmonary:     Effort: Pulmonary effort is normal. No respiratory distress.     Breath sounds: Normal breath sounds.  Neurological:     Mental Status: She is alert.      UC Treatments / Results  Labs (all labs ordered are listed, but only abnormal results are displayed) Labs Reviewed - No data to display  EKG   Radiology No results found.  Procedures Procedures  (including critical care time)  Medications Ordered in UC Medications - No data to display  Initial Impression / Assessment and Plan / UC Course  I have reviewed the triage vital signs and the nursing notes.  Pertinent labs & imaging results that were available during my care of the patient were reviewed by me and considered in my medical decision making (see chart for details).    Acute sinusitis.  Afebrile and vital signs are stable.  Lungs are clear and O2 sat is 98% on room air.  Treating today with cefdinir.  (Precautions for cross allergy with penicillin discussed with patient but she has had antibiotic in this category in the past without difficulty.)  Education provided on sinus infection.  Instructed her to follow-up with her PCP.  ED precautions given.  She agrees to plan of care.  Final Clinical Impressions(s) / UC Diagnoses   Final diagnoses:  Acute non-recurrent maxillary sinusitis     Discharge Instructions      Take the cefdinir as directed.  Follow-up with your primary care provider.     ED Prescriptions     Medication Sig Dispense Auth. Provider  cefdinir (OMNICEF) 300 MG capsule Take 1 capsule (300 mg total) by mouth 2 (two) times daily for 10 days. 20 capsule Corlis Burnard DEL, NP      PDMP not reviewed this encounter.   Corlis Burnard DEL, NP 08/21/24 1353

## 2024-08-21 NOTE — Discharge Instructions (Addendum)
Take the cefdinir as directed.  Follow up with your primary care provider.    

## 2024-08-21 NOTE — ED Triage Notes (Signed)
 Patient states she since last Monday she has been having sneezing, runny nose, green nasal drainage, productive cough with green mucus, fever up to 101, fatigue, facial pressure, hoarseness. The fever has been intermittent and tylenol  has helped.   Her husband was hospitalized last week and had parainfluenzae.    She has tried Mucinex DM and Coricidin HBP.

## 2024-08-27 ENCOUNTER — Encounter: Payer: Self-pay | Admitting: Family

## 2024-08-27 ENCOUNTER — Ambulatory Visit
Admission: RE | Admit: 2024-08-27 | Discharge: 2024-08-27 | Disposition: A | Discharge status: Home / Self Care | Source: Ambulatory Visit | Attending: Family | Admitting: Family

## 2024-08-27 ENCOUNTER — Other Ambulatory Visit (INDEPENDENT_AMBULATORY_CARE_PROVIDER_SITE_OTHER)

## 2024-08-27 ENCOUNTER — Ambulatory Visit: Admitting: Family

## 2024-08-27 ENCOUNTER — Ambulatory Visit
Admission: RE | Admit: 2024-08-27 | Discharge: 2024-08-27 | Disposition: A | Discharge status: Home / Self Care | Attending: Family | Admitting: Family

## 2024-08-27 VITALS — BP 138/90 | HR 95 | Temp 98.6°F | Ht 64.0 in | Wt 119.2 lb

## 2024-08-27 DIAGNOSIS — G459 Transient cerebral ischemic attack, unspecified: Secondary | ICD-10-CM | POA: Diagnosis not present

## 2024-08-27 DIAGNOSIS — J40 Bronchitis, not specified as acute or chronic: Secondary | ICD-10-CM | POA: Insufficient documentation

## 2024-08-27 DIAGNOSIS — I10 Essential (primary) hypertension: Secondary | ICD-10-CM

## 2024-08-27 DIAGNOSIS — R197 Diarrhea, unspecified: Secondary | ICD-10-CM

## 2024-08-27 LAB — COMPREHENSIVE METABOLIC PANEL WITH GFR
ALT: 10 U/L (ref 0–35)
AST: 15 U/L (ref 0–37)
Albumin: 4.1 g/dL (ref 3.5–5.2)
Alkaline Phosphatase: 45 U/L (ref 39–117)
BUN: 13 mg/dL (ref 6–23)
CO2: 25 meq/L (ref 19–32)
Calcium: 9.2 mg/dL (ref 8.4–10.5)
Chloride: 100 meq/L (ref 96–112)
Creatinine, Ser: 0.96 mg/dL (ref 0.40–1.20)
GFR: 64.91 mL/min (ref >60.00)
Glucose, Bld: 82 mg/dL (ref 70–99)
Potassium: 3.5 meq/L (ref 3.5–5.1)
Sodium: 136 meq/L (ref 135–145)
Total Bilirubin: 0.3 mg/dL (ref 0.2–1.2)
Total Protein: 7.3 g/dL (ref 6.0–8.3)

## 2024-08-27 LAB — LIPID PANEL
Cholesterol: 191 mg/dL (ref 0–200)
HDL: 45.8 mg/dL (ref >39.00)
LDL Cholesterol: 118 mg/dL — ABNORMAL HIGH (ref 0–99)
NonHDL: 145.39
Total CHOL/HDL Ratio: 4
Triglycerides: 137 mg/dL (ref 0.0–149.0)
VLDL: 27.4 mg/dL (ref 0.0–40.0)

## 2024-08-27 LAB — CBC WITH DIFFERENTIAL/PLATELET
Basophils Absolute: 0 K/uL (ref 0.0–0.1)
Basophils Relative: 0.8 % (ref 0.0–3.0)
Eosinophils Absolute: 0.1 K/uL (ref 0.0–0.7)
Eosinophils Relative: 1 % (ref 0.0–5.0)
HCT: 38.7 % (ref 36.0–46.0)
Hemoglobin: 13.3 g/dL (ref 12.0–15.0)
Lymphocytes Relative: 31.9 % (ref 12.0–46.0)
Lymphs Abs: 2 K/uL (ref 0.7–4.0)
MCHC: 34.3 g/dL (ref 30.0–36.0)
MCV: 83.5 fl (ref 78.0–100.0)
Monocytes Absolute: 0.6 K/uL (ref 0.1–1.0)
Monocytes Relative: 8.7 % (ref 3.0–12.0)
Neutro Abs: 3.7 K/uL (ref 1.4–7.7)
Neutrophils Relative %: 57.6 % (ref 43.0–77.0)
Platelets: 353 K/uL (ref 150.0–400.0)
RBC: 4.64 Mil/uL (ref 3.87–5.11)
RDW: 12.6 % (ref 11.5–15.5)
WBC: 6.4 K/uL (ref 4.0–10.5)

## 2024-08-27 NOTE — Progress Notes (Unsigned)
   Assessment & Plan:  There are no diagnoses linked to this encounter.   Return precautions given.   Risks, benefits, and alternatives of the medications and treatment plan prescribed today were discussed, and patient expressed understanding.   Education regarding symptom management and diagnosis given to patient on AVS either electronically or printed.  No follow-ups on file.  Rollene Northern, FNP  Subjective:    Patient ID: Jill Porter, female    DOB: 11-05-1965, 58 y.o.   MRN: [id number]  CC: Jill Porter is a 58 y.o. female who presents today for follow up.   HPI: HPI Nonbloody watery brown diarrhea  Follow-up for sinusitis seen urgent care 08/21/2024 Treated with cefdinir  x 10 days PCN and sulfa allergies   Allergies: Sulfa antibiotics, Penicillins, Silicone, and Tape Current Outpatient Medications on File Prior to Visit  Medication Sig Dispense Refill   atorvastatin  (LIPITOR ) 80 MG tablet TAKE 1 TABLET BY MOUTH EVERY DAY 90 tablet 0   Calcium  Carbonate-Vit D-Min (CALCIUM  1200 PO) Take by mouth.     cefdinir  (OMNICEF ) 300 MG capsule Take 1 capsule (300 mg total) by mouth 2 (two) times daily for 10 days. 20 capsule 0   Cholecalciferol (VITAMIN D3) 20 MCG (800 UNIT) TABS Take by mouth.     cyanocobalamin  (VITAMIN B12) 1000 MCG tablet Take 1,000 mcg by mouth daily.     estradiol  (ESTRACE ) 1 MG tablet Take 1 tablet (1 mg total) by mouth daily. 90 tablet 3   fenofibrate  54 MG tablet TAKE 1 TABLET BY MOUTH DAILY 90 tablet 3   hydrALAZINE  (APRESOLINE ) 50 MG tablet Take 1 tablet (50 mg total) by mouth 3 (three) times daily as needed (If systolic BP greater than 150 mmHg.). If systolic BP greater than 150 mmHg. 30 tablet 0   hydrochlorothiazide  (MICROZIDE ) 12.5 MG capsule Take 1 capsule (12.5 mg total) by mouth daily. 90 capsule 3   losartan  (COZAAR ) 100 MG tablet Take 1 tablet (100 mg total) by mouth daily. 90 tablet 3   Magnesium 400 MG TABS Take 420 mg by mouth.      metoprolol  succinate (TOPROL -XL) 50 MG 24 hr tablet Take 50 mg by mouth daily.     zolpidem  (AMBIEN ) 10 MG tablet TAKE 1 TABLET BY MOUTH EVERY DAY AT BEDTIME AS NEEDED FOR SLEEP 30 tablet 2   No current facility-administered medications on file prior to visit.    Review of Systems    Objective:    BP (!) 138/90   Pulse 100   Temp 98.6 F (37 C) (Oral)   Ht 5' 4 (1.626 m)   Wt 119 lb 3.2 oz (54.1 kg)   SpO2 98%   BMI 20.46 kg/m  BP Readings from Last 3 Encounters:  08/27/24 (!) 138/90  08/21/24 139/82  08/12/24 118/70   Wt Readings from Last 3 Encounters:  08/27/24 119 lb 3.2 oz (54.1 kg)  08/12/24 123 lb 12.8 oz (56.2 kg)  05/09/24 133 lb 3.2 oz (60.4 kg)    Physical Exam

## 2024-08-27 NOTE — Assessment & Plan Note (Signed)
 No acute respiratory distress.  Walking SaO2 96%.  Heart rate max at 106. Pending stat chest x-ray.  Advised to stop cefdinir  due to diarrhea.  If diarrhea does not promptly resolve with cessation of cefdinir , advised to return stool studies to [facility] to rule out GI infection.  Consider prednisone  if chest x-ray is negative for pneumonia

## 2024-08-27 NOTE — Patient Instructions (Addendum)
 Stop cedfinir Start probiotics  If diarrhea persists, you must go to [facility] medical mall to give stool samples to screen for antibiotic associated diarrheal infections such as CDifficile .    Monitor blood pressure at home and me 5-6 reading on separate days. Goal is less than 120/80, based on newest guidelines, however we certainly want to be less than 130/80;  if persistently higher, please make sooner follow up appointment so we can recheck you blood pressure and manage/ adjust medications.  Please go directly to [facility] imaging center for chest x-ray today.  [address] in [place].  Phone number is [phone]  Please let me know how you are doing

## 2024-08-28 ENCOUNTER — Ambulatory Visit: Payer: Self-pay | Admitting: Family

## 2024-08-28 ENCOUNTER — Other Ambulatory Visit: Payer: Self-pay | Admitting: Family

## 2024-08-28 DIAGNOSIS — J40 Bronchitis, not specified as acute or chronic: Secondary | ICD-10-CM

## 2024-08-28 MED ORDER — PREDNISONE 10 MG PO TABS: ORAL_TABLET | ORAL | 0 refills | Status: AC

## 2024-09-04 ENCOUNTER — Other Ambulatory Visit: Payer: Self-pay | Admitting: Family

## 2024-09-04 DIAGNOSIS — G459 Transient cerebral ischemic attack, unspecified: Secondary | ICD-10-CM

## 2024-09-04 MED ORDER — ATORVASTATIN CALCIUM 80 MG PO TABS: 80.0000 mg | ORAL_TABLET | Freq: Every day | ORAL | 3 refills | Status: AC

## 2024-09-04 MED ORDER — EZETIMIBE 10 MG PO TABS: 10.0000 mg | ORAL_TABLET | Freq: Every day | ORAL | 3 refills | Status: AC

## 2024-09-06 NOTE — Progress Notes (Signed)
 Last read by Madelin Charlies Evaline Charlies at 9:55AM on 09/04/2024. Patient is scheduled with Dineen, FNP on 11/12/24.

## 2024-11-12 ENCOUNTER — Ambulatory Visit: Admitting: Family
